# Patient Record
Sex: Female | Born: 1980 | Race: Asian | Hispanic: No | Marital: Married | Smoking: Never smoker
Health system: Southern US, Community
[De-identification: ages and names within clinical notes are randomized; demographics above are authoritative.]

## PROBLEM LIST (undated history)

## (undated) ENCOUNTER — Inpatient Hospital Stay (HOSPITAL_COMMUNITY): Payer: Self-pay

## (undated) DIAGNOSIS — O24419 Gestational diabetes mellitus in pregnancy, unspecified control: Secondary | ICD-10-CM

## (undated) DIAGNOSIS — Z8632 Personal history of gestational diabetes: Secondary | ICD-10-CM

## (undated) DIAGNOSIS — Z8751 Personal history of pre-term labor: Secondary | ICD-10-CM

## (undated) DIAGNOSIS — O139 Gestational [pregnancy-induced] hypertension without significant proteinuria, unspecified trimester: Secondary | ICD-10-CM

## (undated) DIAGNOSIS — Z8759 Personal history of other complications of pregnancy, childbirth and the puerperium: Secondary | ICD-10-CM

## (undated) DIAGNOSIS — O09299 Supervision of pregnancy with other poor reproductive or obstetric history, unspecified trimester: Secondary | ICD-10-CM

## (undated) HISTORY — DX: Personal history of gestational diabetes: Z86.32

## (undated) HISTORY — DX: Gestational diabetes mellitus in pregnancy, unspecified control: O24.419

## (undated) HISTORY — DX: Personal history of other complications of pregnancy, childbirth and the puerperium: Z87.59

## (undated) HISTORY — DX: Personal history of pre-term labor: Z87.51

## (undated) HISTORY — DX: Supervision of pregnancy with other poor reproductive or obstetric history, unspecified trimester: O09.299

## (undated) HISTORY — PX: MIDDLE EAR SURGERY: SHX713

---

## 2006-07-16 ENCOUNTER — Inpatient Hospital Stay (HOSPITAL_COMMUNITY): Admission: AD | Admit: 2006-07-16 | Discharge: 2006-07-18 | Payer: Self-pay | Admitting: Obstetrics and Gynecology

## 2006-07-16 ENCOUNTER — Ambulatory Visit: Payer: Self-pay | Admitting: Obstetrics and Gynecology

## 2007-04-02 ENCOUNTER — Ambulatory Visit: Payer: Self-pay | Admitting: Internal Medicine

## 2007-04-05 ENCOUNTER — Ambulatory Visit: Payer: Self-pay | Admitting: Family Medicine

## 2007-05-17 ENCOUNTER — Encounter: Payer: Self-pay | Admitting: Obstetrics & Gynecology

## 2007-05-17 ENCOUNTER — Observation Stay (HOSPITAL_COMMUNITY): Admission: AD | Admit: 2007-05-17 | Discharge: 2007-05-18 | Payer: Self-pay | Admitting: Obstetrics and Gynecology

## 2007-05-23 ENCOUNTER — Inpatient Hospital Stay (HOSPITAL_COMMUNITY): Admission: AD | Admit: 2007-05-23 | Discharge: 2007-05-23 | Payer: Self-pay | Admitting: Obstetrics & Gynecology

## 2007-06-02 ENCOUNTER — Emergency Department (HOSPITAL_COMMUNITY): Admission: EM | Admit: 2007-06-02 | Discharge: 2007-06-02 | Payer: Self-pay | Admitting: Family Medicine

## 2007-07-15 DIAGNOSIS — O09299 Supervision of pregnancy with other poor reproductive or obstetric history, unspecified trimester: Secondary | ICD-10-CM

## 2007-07-15 HISTORY — DX: Supervision of pregnancy with other poor reproductive or obstetric history, unspecified trimester: O09.299

## 2007-09-28 ENCOUNTER — Ambulatory Visit: Payer: Self-pay | Admitting: Internal Medicine

## 2008-01-01 ENCOUNTER — Inpatient Hospital Stay (HOSPITAL_COMMUNITY): Admission: AD | Admit: 2008-01-01 | Discharge: 2008-01-01 | Payer: Self-pay | Admitting: Gynecology

## 2008-02-26 ENCOUNTER — Ambulatory Visit: Payer: Self-pay | Admitting: Family Medicine

## 2008-02-26 ENCOUNTER — Inpatient Hospital Stay (HOSPITAL_COMMUNITY): Admission: AD | Admit: 2008-02-26 | Discharge: 2008-03-01 | Payer: Self-pay | Admitting: Obstetrics

## 2008-02-26 ENCOUNTER — Encounter: Payer: Self-pay | Admitting: Family Medicine

## 2008-02-28 ENCOUNTER — Encounter (INDEPENDENT_AMBULATORY_CARE_PROVIDER_SITE_OTHER): Payer: Self-pay | Admitting: Cardiology

## 2008-03-01 ENCOUNTER — Encounter: Payer: Self-pay | Admitting: *Deleted

## 2008-03-26 ENCOUNTER — Inpatient Hospital Stay (HOSPITAL_COMMUNITY): Admission: AD | Admit: 2008-03-26 | Discharge: 2008-03-26 | Payer: Self-pay | Admitting: Obstetrics and Gynecology

## 2008-03-26 ENCOUNTER — Ambulatory Visit: Payer: Self-pay | Admitting: Obstetrics and Gynecology

## 2008-10-05 ENCOUNTER — Emergency Department (HOSPITAL_COMMUNITY): Admission: EM | Admit: 2008-10-05 | Discharge: 2008-10-05 | Payer: Self-pay | Admitting: Emergency Medicine

## 2008-12-07 ENCOUNTER — Ambulatory Visit: Payer: Self-pay | Admitting: *Deleted

## 2008-12-07 ENCOUNTER — Ambulatory Visit: Payer: Self-pay | Admitting: Internal Medicine

## 2009-01-01 ENCOUNTER — Ambulatory Visit: Payer: Self-pay | Admitting: Internal Medicine

## 2010-11-26 NOTE — Op Note (Signed)
NAMEGELENE, RECKTENWALD                    ACCOUNT NO.:  [id number]   MEDICAL RECORD NO.:  [id number]          PATIENT TYPE:  INP   LOCATION:  9372                          FACILITY:  [facility]   PHYSICIAN:  Tanya S. Shawnie Pons, M.D.   DATE OF BIRTH:  1981-05-27   DATE OF PROCEDURE:  02/26/2008  DATE OF DISCHARGE:                               OPERATIVE REPORT   PREOPERATIVE DIAGNOSES:  Intrauterine pregnancy at 29-5/7 weeks;  hemolysis, elevated liver enzymes, and low platelet syndrome;  nonreassuring fetal heart rate; tracing limited prenatal care; and  language barrier.   POSTOPERATIVE DIAGNOSES:  Intrauterine pregnancy at 29-5/7 weeks, HELLP  syndrome, nonreassuring fetal heart rate tracing, limited prenatal care,  and language barrier.   PROCEDURES:  Primary low-transverse cesarean-section.   SURGEON:  Tinnie Gens, MD.   ASSISTANT:  Cam Hai, C.N.M.   ANESTHESIA:  General and local, Burnett Corrente, M.D.   FINDINGS:  Viable female infant, Apgars 8 and 8, weight 1340 grams, and  cord pH 7.29.   SPECIMEN:  Placenta to Pathology.   ESTIMATED BLOOD LOSS:  500 mL.   COMPLICATIONS:  None known.   REASON FOR PROCEDURE:  Briefly, the patient is a 30 year old gravida 3,  para 1-0-1-1 who has history second trimester loss, who had previously  had a normal vaginal delivery in January 2008, and in November 2008 she  had a 16-week SAB and then her third pregnancy again is unclear.  She  may be a patient of Dr. Francoise Ceo for this pregnancy.  However,  prenatal records could not be found.  The patient presented to the ER  with substernal chest pain, some marked hypertension 200/122, and  bradycardia.  She received Apresoline and magnesium sulfate in the MAU,  and laboratories were drawn.  She was then transferred to the ICU where  labs revealed the patient to be in HELLP  syndrome.  Initially fetal  heart rate tracing in the MAU showed the baby to be in the 140s with  excellent  variability and accelerations.  After the patient was  transferred to the ICU, the fetal heart rate was in the 160s with  repetitive decelerations.  The patient did undergo ultrasonography,  which revealed normal fluid, normal Dopplers, and probable IUGR 3-pound  baby.  The patient had significant retching and emesis multiple times  for which she received Zofran, but she continued to have severe  epigastric pain.  Given the nonreassuring state of the baby, the change  in the fetal heart rate, as well as severe nature of the patient's  disease, it was felt the patient would best be served by immediate  delivery.  The patient was dilated 1-2/50%.  However, the baby just  continued to look poor and it was felt immediate delivery was warranted.   PROCEDURE:  The patient was taken to the OR.  She was placed in supine  position.  She was prepped and draped in usual sterile fashion.  After  general anesthesia was administered, knife was used to make a  Pfannenstiel incision through the skin and  was carried down to through  the underlying fascia, which was nicked in the midline.  The  subcutaneous tissue was then bluntly separated.  The fascia was also  bluntly separated and taken off the underlying rectus.  The rectus was  divided in the midline, peritoneal cavity entered bluntly.  Bladder  blade was then placed inside the incision and the infant was noted to be  vertex.  So, a low transverse incision was made on the uterus.  This  incision was extended down to the amniotic cavity and the lower uterine  segment was very thin.  Clear amniotic fluid was noted.  Infant was in  the vertex and infant delivered easily and spontaneous crying was heard.  Cord was clamped x2.  Infant was given to awaiting peds.  Cord pH and  cord bloods were obtained.  Placenta was delivered without difficulty,  it looked like possible 20% abruption on the placenta bed.  The uterus  was cleaned with dry lap pads.  The  edge of the uterine incision was  grasped with ring forceps and the uterine incision closed with 0 Vicryl  suture in a locked running fashion.  Second imbricating layer was then  used to achieve hemostasis.  The uterine incision was inspected twice  and was found to be hemodynamically hemostatic.  Attention was then  turned to the rectus peritoneum, there was no bleeding noted.  Fascia  was closed with a 0 Vicryl suture in a running fashion.  Subcutaneous  tissue was irrigated and bleeders cauterized with electrocautery and  skin closed using clips.  A 30 mL of 0.25% Marcaine were then  infiltrated about the incision.  All instrument and lap counts were  correct x2.  The patient was awakened and taken to recovery room in  stable condition.      Shelbie Proctor. Shawnie Pons, M.D.  Electronically Signed     TSP/MEDQ  D:  02/27/2008  T:  02/27/2008  Job:  [id number]

## 2010-11-26 NOTE — Consult Note (Signed)
Sabrina Daniels, Sabrina Daniels                    ACCOUNT NO.:  [id number]   MEDICAL RECORD NO.:  [id number]          PATIENT TYPE:  INP   LOCATION:  9372                          [facility]ACILITY:  [facility]   PHYSICIAN:  Jake Bathe, MD      DATE OF BIRTH:  1981-06-18   DATE OF CONSULTATION:  02/27/2008  DATE OF DISCHARGE:                                 CONSULTATION   REASON FOR EVALUATION:  Sabrina Daniels has been seen at the request of Dr.  Penne Lash for the evaluation of chest pain in the setting of elevated  cardiac biomarkers.   HISTORY OF PRESENT ILLNESS:  [name] who was at 29 weeks of  pregnancy who came in with chest pain, hypertension, and was noted to be  in preeclampsia with HELLP syndrome.  She had been experiencing some  nausea and vomiting and shortness of breath prior to her C-section.  Following the C-section, cardiac biomarkers were obtained with CK 83-91-  133, MB 7.7-4.8-5.7, and troponin 0.03-0.10-0.04 in that order.  Last  night, critical care medicine was consulted and felt as though blood  pressure was adequately controlled and ECG was unremarkable.  Ordered an  ABG, which demonstrated a blood gas of 7.49/25/107, likely a low pCO2  secondary to hyperventilating from pain.   She only had 1 prenatal visit.  She currently is lying comfortably in  bed and with the assistance of her sister-in-law as a Nurse, learning disability, she  does not complain of any chest pain currently or any shortness of  breath.  She does complain, however, of some incisional pain.   PAST MEDICAL HISTORY:  None.  She has had 1 prior child without any  difficulty during that pregnancy, and she had 1 miscarriage.  No prior  history of hypertension.   MEDICATIONS:  None, except for prenatals.   ALLERGIES:  No known drug allergies.   SOCIAL HISTORY:  She denies any tobacco, alcohol, or illicit drug use.  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   FAMILY HISTORY:  No early family history of  coronary artery disease or  sudden cardiac death.   REVIEW OF SYSTEMS:  She denies any prior syncope or prior chest pain.  Positive for nausea; vomiting; earlier chest discomfort; and shortness  of breath, which has now resolved.  Unless stated above, all other 12  review of systems are negative.   PHYSICAL EXAMINATION:  VITAL SIGNS:  Blood pressure currently 162/92,  ranging from 138/90 to 160/101 with a heart rate in the 70s; satting 98%  on room air; and breathing 16 times per minute.  GENERAL:  Alert and oriented x3, in no acute distress.  Lying  comfortably in bed, family at side.  HEENT:  EYES, well-perfused conjunctivae.  EOMI.  No scleral icterus.  NECK:  Normal carotid upstrokes.  No JVD.  No carotid bruits.  No  thyromegaly.  No lymphadenopathy.  Moist mucous membranes.  CARDIOVASCULAR:  Regular rate and rhythm with hyperdynamic PMI.  No  murmurs, rubs, or gallops appreciated.  No RV heave.  LUNGS:  Clear to auscultation bilaterally.  Normal respiratory effort.  ABDOMEN:  Soft and nontender.  Normoactive bowel sounds with C-section  dressing in place.  She is, however, tender around C-section scar.  EXTREMITIES:  No clubbing, cyanosis, or edema.  Normal distal pulses.  No cords palpated.  NEUROLOGIC:  Nonfocal.  No seizure-like activity.  SKIN:  Warm, dry, and intact.   LABORATORY DATA:  EKG shows sinus tachycardia, otherwise normal.  Sodium  134; potassium 4.1; BUN 9; creatinine 0.6; glucose mildly elevated at  138, after betamethasone was given last night; total bilirubin 1.1;  alkaline phosphatase 230; AST elevated at 607; and ALT 380.  Chest x-ray  from yesterday showed no evidence for acute cardiopulmonary disease,  this one was personally viewed.   ASSESSMENT AND PLAN:  A 30 year old female at 43 weeks who had an  emergent cesarean section secondary to preeclampsia/hemolysis, elevated  liver enzymes, and low platelet syndrome who was complaining of chest  pain,  shortness of breath prior to cesarean section, who also had  elevated cardiac biomarkers as described above categorized as mildly  elevated cardiac biomarkers.  1. Chest pain - currently resolved.  Reassuring ECG and reassuring      third set of cardiac biomarkers with troponin now 0.04, CK of 133,      and MB of 5.7.  ABG also reassuring with no evidence of AA      gradient.  Low likelihood for pulmonary embolism or acute coronary      artery syndrome.  Her cardiac biomarkers mild or subtle elevation      is most likely secondary to acute illness/HELLP syndrome.  We      commonly see this in the setting of acute illnesses such as      pneumonia or sepsis for instance.  Another possibility may be due      to excessive hypertension causing mild degree of subendocardial      ischemia.  Not likely acute coronary artery syndrome.  Nonetheless,      I will check an echocardiogram to ensure that there is no evidence      of any wall motion abnormalities.  Continue telemetry.  We will      check an EKG in the morning.  Check one more set of cardiac      biomarkers in the morning.  I will also check a TSH, free T4, and      free T3.  As of note, her glucose was mildly elevated most likely      secondary to steroid use.  2. Hypertension - I will administer hydralazine 10 mg IV x1 now.  I      spoke with Dr. Penne Lash, who was comfortable with metoprolol.  This      may be a good agent for her if her blood pressure is still an issue      at discharge given its ease of use.  Continue hydrochlorothiazide.   I will follow up with results of echocardiogram.  I will also follow  along with you during this hospitalization.      Jake Bathe, MD  Electronically Signed     MCS/MEDQ  D:  02/27/2008  T:  02/28/2008  Job:  [id number]   cc:   Lesly Dukes, M.D.

## 2010-11-26 NOTE — Discharge Summary (Signed)
Sabrina Daniels                    ACCOUNT NO.:  [id number]   MEDICAL RECORD NO.:  [id number]          PATIENT TYPE:  INP   LOCATION:  9315                          FACILITY:  [facility]   PHYSICIAN:  Lesly Dukes, M.D. DATE OF BIRTH:  07-Jun-1981   DATE OF ADMISSION:  02/26/2008  DATE OF DISCHARGE:  03/01/2008                               DISCHARGE SUMMARY   REASON FOR ADMISSION:  1. Preeclampsia.  2. HELLP.  3. Chest pain.   PROCEDURES PRENATAL:  None.   PROCEDURES INTRAPARTUM:  Low-transverse cesarean section.   PROCEDURES POSTPARTUM:  None.   DISCHARGE DIAGNOSES:  1. Preeclampsia/HELLP  2. Nonreassuring fetal heart tracing leading to C-section.   DISCHARGE INFORMATION:  Activity is pelvic rest x6 weeks.   DIET:  Routine.   MEDICATIONS:  1. HCTZ 25 mg p.o. daily.  2. Ibuprofen 600 mg p.o. q.6 h. p.r.n. postpartum pain.   Status, well.  Instructions, routine.   Discharged to home.   Follow up in 6 weeks at the [facility].   NEWBORN DATA:  Delivered female, newborn is in the NICU.   BRIEF HOSPITAL COURSE:  This is a 30 year old G3, P-1-0-1-1 at 40.[redacted]  weeks gestational age who was admitted for preeclampsia with HELLP  syndrome.  The patient had emergent C-section secondary to preeclampsia  and hemolysis with elevated LFTs in the 600-800s, decreased platelets in  the 50s, chest pain, short of breath, and increased cardiac enzymes.  Cardiac enzymes were cycled and they were essentially negative for  ischemia.  Postpartum EKG and echo were normal per Cardiology consult.  The patient was placed on magnesium at admission, Magnesium was  discontinued 24 hours postop when her blood pressure stabilized.  LFTs  trended down towards normal, and the platelets increased towards normal  daily during this hospitalization.  The patient is discharged home on  postoperative day #3.  Blood pressure is stable.  She is afebrile.  For  followup, the patient needs  to have a TSH, free T3, free T4 at followup  appointment.  During this admission, she had normal TSH with increase in  free T3.  She may need further outpatient workup in Endocrinology.  She  may also need further followup for hypertension although it is believed  that her hypertension may be due to pregnancy.   DISCHARGE LABS:  Blood type positive, antibody negative, RPR  nonreactive, HIV negative, hepatitis B surface antigen negative.  Sodium  137, potassium 3.9, chloride 103, bicarb 28, BUN 6, creatinine 0.48,  glucose 80, AST 67, ALT 142, alkaline phosphatase 188, albumin 2.5,  total protein 6.0, and calcium 7.9.      Angeline Slim, MD  Electronically Signed     ______________________________  Lesly Dukes, M.D.    CT/MEDQ  D:  03/13/2008  T:  03/14/2008  Job:  [id number]

## 2011-04-21 ENCOUNTER — Other Ambulatory Visit: Payer: Self-pay | Admitting: Family Medicine

## 2011-04-21 DIAGNOSIS — Z3689 Encounter for other specified antenatal screening: Secondary | ICD-10-CM

## 2011-04-21 LAB — VARICELLA ZOSTER ANTIBODY, IGG: Varicella: IMMUNE

## 2011-04-21 LAB — CBC
Hemoglobin: 12 g/dL (ref 12.0–16.0)
Platelets: 154 10*3/uL (ref 150–399)

## 2011-04-22 LAB — POCT PREGNANCY, URINE: Operator id: 120561

## 2011-04-22 LAB — DIFFERENTIAL
Basophils Relative: 0
Eosinophils Relative: 2
Lymphs Abs: 2.5
Monocytes Absolute: 0.4

## 2011-04-22 LAB — CBC
HCT: 32.6 — ABNORMAL LOW
Hemoglobin: 10.9 — ABNORMAL LOW
MCHC: 33.6
Platelets: 180
RBC: 4.63
RDW: 15.6 — ABNORMAL HIGH
WBC: 9.7

## 2011-04-22 LAB — ABO/RH
ABO/RH(D): O POS
RH Type: POSITIVE

## 2011-04-22 LAB — RPR: RPR Ser Ql: NONREACTIVE

## 2011-04-22 LAB — ANTIBODY SCREEN: Antibody Screen: NEGATIVE

## 2011-04-23 ENCOUNTER — Inpatient Hospital Stay (HOSPITAL_COMMUNITY)
Admission: AD | Admit: 2011-04-23 | Discharge: 2011-04-23 | Disposition: A | Discharge status: Home / Self Care | Payer: Medicaid Other | Source: Ambulatory Visit | Attending: Family Medicine | Admitting: Family Medicine

## 2011-04-23 ENCOUNTER — Encounter (HOSPITAL_COMMUNITY): Payer: Self-pay

## 2011-04-23 DIAGNOSIS — O2650 Maternal hypotension syndrome, unspecified trimester: Secondary | ICD-10-CM | POA: Insufficient documentation

## 2011-04-23 DIAGNOSIS — R55 Syncope and collapse: Secondary | ICD-10-CM

## 2011-04-23 DIAGNOSIS — D649 Anemia, unspecified: Secondary | ICD-10-CM

## 2011-04-23 DIAGNOSIS — O210 Mild hyperemesis gravidarum: Secondary | ICD-10-CM | POA: Insufficient documentation

## 2011-04-23 HISTORY — DX: Gestational (pregnancy-induced) hypertension without significant proteinuria, unspecified trimester: O13.9

## 2011-04-23 LAB — COMPREHENSIVE METABOLIC PANEL
Alkaline Phosphatase: 49 U/L (ref 39–117)
CO2: 26 mEq/L (ref 19–32)
Calcium: 9.6 mg/dL (ref 8.4–10.5)
Chloride: 100 mEq/L (ref 96–112)
Glucose, Bld: 79 mg/dL (ref 70–99)
Sodium: 134 mEq/L — ABNORMAL LOW (ref 135–145)
Total Protein: 7.1 g/dL (ref 6.0–8.3)

## 2011-04-23 LAB — CBC
Hemoglobin: 11.8 g/dL — ABNORMAL LOW (ref 12.0–15.0)
MCHC: 33.1 g/dL (ref 30.0–36.0)
RBC: 5.08 MIL/uL (ref 3.87–5.11)

## 2011-04-23 LAB — URINALYSIS, ROUTINE W REFLEX MICROSCOPIC
Bilirubin Urine: NEGATIVE
Hgb urine dipstick: NEGATIVE
Ketones, ur: NEGATIVE mg/dL
Protein, ur: NEGATIVE mg/dL

## 2011-04-23 MED ORDER — ONDANSETRON 8 MG PO TBDP
8.0000 mg | ORAL_TABLET | Freq: Once | ORAL | Status: AC
  Administered 2011-04-23: 8 mg via ORAL
  Filled 2011-04-23: qty 1

## 2011-04-23 MED ORDER — ONDANSETRON 8 MG PO TBDP: 8.0000 mg | ORAL_TABLET | Freq: Once | ORAL | Status: DC

## 2011-04-23 MED ORDER — ONDANSETRON 8 MG PO TBDP: 8.0000 mg | ORAL_TABLET | Freq: Once | ORAL | Status: AC

## 2011-04-23 MED ORDER — PROMETHAZINE HCL 25 MG PO TABS: 25.0000 mg | ORAL_TABLET | Freq: Four times a day (QID) | ORAL | Status: DC | PRN

## 2011-04-23 NOTE — ED Provider Notes (Signed)
History     No chief complaint on file.  HPI Pt is 12w 5 days pregnant and passed out this morning for about 5 minutes when she got up suddenly.  Pt states that she has does this with her previous pregnancies.  She has a history of hypertension with pregnancy and preterm labor and has an appointment to go to [facility] OB clinic.  She had a small amount of rice this morning and some water and then a banana about 11 am.  Pt has been nauseated for several days but not vomiting. She just has not wanted anything to eat.  Pt denies spotting or bleeding or cramping or pain with urination, constipation or diarrhea.  She has not run a feve.  History was somewhat limited due to language barrier- [employer] translator was [employer]   Past Medical History  Diagnosis Date  . Pregnancy induced hypertension   . Preterm labor     Past Surgical History  Procedure Date  . Middle ear surgery   . Cesarean section     No family history on file.  History  Substance Use Topics  . Smoking status: Never Smoker   . Smokeless tobacco: Never Used  . Alcohol Use: No    Allergies: No Known Allergies  Prescriptions prior to admission  Medication Sig Dispense Refill  . Influenza Virus Vacc Split PF (FLUARIX IM) Inject 0.5 mLs into the muscle once.        . prenatal vitamin w/FE, FA (PRENATAL 1 + 1) 27-1 MG TABS Take 1 tablet by mouth daily.          Review of Systems  Constitutional: Negative for fever and chills.  Gastrointestinal: Positive for nausea and abdominal pain. Negative for vomiting, diarrhea and constipation.  Genitourinary: Negative for dysuria.  Neurological: Positive for dizziness. Negative for headaches.   Physical Exam   Blood pressure 107/67, pulse 79, temperature 97.8 F (36.6 C), temperature source Oral, resp. rate 16, height 5' 0.75" (1.543 m), weight 113 lb 12.8 oz (51.619 kg), SpO2 98.00%.  Physical Exam  Nursing note and vitals reviewed. Constitutional: She is oriented to person,  place, and time. She appears well-developed and well-nourished.  HENT:  Head: Normocephalic and atraumatic.  Eyes: Pupils are equal, round, and reactive to light.  Neck: Normal range of motion. Neck supple.  Cardiovascular: Normal rate.   Respiratory: Effort normal.  GI: Soft.       FHT audible  Musculoskeletal: Normal range of motion.  Neurological: She is alert and oriented to person, place, and time.  Skin: Skin is warm and dry.  Psychiatric: She has a normal mood and affect. Her behavior is normal. Judgment and thought content normal.    MAU Course  Procedures CBC, CMET, Urinalysis, VS reviewed All within normal limits except some anemia Zofran given for nausea and pt tolerated PO fluids and crackers    Assessment and Plan  Morning sickness Syncope Pt will f/u with her OB appt on Fri at the HD clinic and then at the [facility] HR clinic- appointments made Importance of hydration and protein type foods along with rising slowing Pt information in [place] given to pt Discharge instructions also given by RN with translator  Pamelia Hoit 04/23/2011, 5:43 PM

## 2011-04-23 NOTE — Progress Notes (Signed)
As translaiton continues, pt states she fell down and wants to know why.

## 2011-04-23 NOTE — Progress Notes (Signed)
Per [employer] interpreter, pt states fell down, body felt dizzy, all occurred at 10am, denies vaginal discharge or bleeding, states when she gets up, body feels sweaty. Has only felt like this once before. Felt nausea at 1000, denies vomiting or diarrhea. Ate rice at 0800, banana at 1100, drank water last then.

## 2011-04-23 NOTE — Progress Notes (Signed)
[employer] translator for triage. Pt states she had blurred vision this morning and fell asleep. Her husband states for 5 minutes she did not know anything. Pt denies any pain. Nausea and vomiting x 2 today. Pt states she feels OK now.

## 2011-04-24 NOTE — ED Provider Notes (Signed)
Chart reviewed and agree with management and plan.  

## 2011-04-25 LAB — GLUCOSE TOLERANCE, 3 HOURS
Glucose, GTT - 1 Hour: 90 mg/dL (ref ?–200)
Glucose, GTT - 3 Hour: 63 mg/dL (ref ?–140)
Glucose, GTT - Fasting: 67 mg/dL — AB (ref 80–110)

## 2011-04-29 DIAGNOSIS — O09299 Supervision of pregnancy with other poor reproductive or obstetric history, unspecified trimester: Secondary | ICD-10-CM | POA: Insufficient documentation

## 2011-04-29 DIAGNOSIS — D573 Sickle-cell trait: Secondary | ICD-10-CM

## 2011-04-29 NOTE — Progress Notes (Signed)
Pt received flu vaccine on 04/21/11 at [facility]

## 2011-05-01 ENCOUNTER — Ambulatory Visit (INDEPENDENT_AMBULATORY_CARE_PROVIDER_SITE_OTHER): Payer: Medicaid Other | Admitting: Obstetrics & Gynecology

## 2011-05-01 ENCOUNTER — Encounter: Payer: Self-pay | Admitting: Obstetrics & Gynecology

## 2011-05-01 VITALS — BP 116/79 | Temp 97.1°F | Wt 115.1 lb

## 2011-05-01 DIAGNOSIS — O09299 Supervision of pregnancy with other poor reproductive or obstetric history, unspecified trimester: Secondary | ICD-10-CM

## 2011-05-01 LAB — POCT URINALYSIS DIP (DEVICE)
Glucose, UA: NEGATIVE mg/dL
Hgb urine dipstick: NEGATIVE
Specific Gravity, Urine: 1.015 (ref 1.005–1.030)
Urobilinogen, UA: 0.2 mg/dL (ref 0.0–1.0)
pH: 7.5 (ref 5.0–8.0)

## 2011-05-01 NOTE — Progress Notes (Signed)
Pulse-78. Interpreter # [phone].  No vaginal discharge.

## 2011-05-01 NOTE — Progress Notes (Signed)
Needs baseline labs and 24 hour urine for hx of HELLP at 29 weeks requiring emergency c/s due to NRFHT.  Pt had elevated T3--TSH today.  Pt told of 6% recurrence of HELLP.  Pt had an episode of heart racing and pt syncopal episode for 5 mins on Oct 10th.  Will send to cards to evaluate.  Offer genetic screening next visit.   No urine on chart to record in chart.  RN aware.

## 2011-05-01 NOTE — Assessment & Plan Note (Signed)
Needs baseline labs / 24 hour urine.

## 2011-05-01 NOTE — Progress Notes (Signed)
Patient has a cardiology appt. Tues. October 23 at 345 pm with Dr. Daleen Squibb at [facility]. Patient and friend given contact info. Interpreter [phone].

## 2011-05-02 LAB — TSH: TSH: 0.9 u[IU]/mL (ref 0.350–4.500)

## 2011-05-02 LAB — COMPREHENSIVE METABOLIC PANEL
ALT: 18 U/L (ref 0–35)
AST: 22 U/L (ref 0–37)
Alkaline Phosphatase: 44 U/L (ref 39–117)
Creat: 0.48 mg/dL — ABNORMAL LOW (ref 0.50–1.10)
Total Bilirubin: 0.3 mg/dL (ref 0.3–1.2)

## 2011-05-05 NOTE — Progress Notes (Signed)
Addended by: Faythe Casa on: 05/05/2011 08:31 AM   Modules accepted: Orders

## 2011-05-06 ENCOUNTER — Encounter: Payer: Self-pay | Admitting: Cardiology

## 2011-05-06 ENCOUNTER — Ambulatory Visit (INDEPENDENT_AMBULATORY_CARE_PROVIDER_SITE_OTHER): Payer: Medicaid Other | Admitting: Cardiology

## 2011-05-06 VITALS — BP 116/76 | HR 79 | Ht 61.0 in | Wt 116.0 lb

## 2011-05-06 DIAGNOSIS — R002 Palpitations: Secondary | ICD-10-CM

## 2011-05-06 DIAGNOSIS — R0602 Shortness of breath: Secondary | ICD-10-CM | POA: Insufficient documentation

## 2011-05-06 DIAGNOSIS — R42 Dizziness and giddiness: Secondary | ICD-10-CM | POA: Insufficient documentation

## 2011-05-06 DIAGNOSIS — O09299 Supervision of pregnancy with other poor reproductive or obstetric history, unspecified trimester: Secondary | ICD-10-CM

## 2011-05-06 LAB — CREATININE CLEARANCE, URINE, 24 HOUR: Creatinine, Urine: 81.2 mg/dL

## 2011-05-06 NOTE — Patient Instructions (Signed)
Your physician recommends that you have lab work today  Bmp, mg, tsh.  We will call you with your lab results.    Remember to drink plenty of fluids especially water during your pregnancy.   You may use salt in your cooking.  It is not restricted.  If you become short of breath while lying in bed, elevate your head on 2 pillows. This will help to move the baby off of chest and help you to breathe better.  When you are getting out of bed turn over on your left side, then slowly, get up out of bed.  This will help reduce the dizziness or lightheadness you may experience when getting out of bed.  Your physician recommends that you schedule a follow-up appointment as needed with Dr. Daleen Squibb.

## 2011-05-06 NOTE — Progress Notes (Signed)
HPI Sabrina Daniels is a 30 year old [place] woman who comes in today with hard heart beats, shortness of breath when she lies down, and dizziness upon standing.  This is her fourth pregnancy. In 2009, she had chest discomfort. She ruled out for myocardial infarction with enzymes. 2-D echocardiogram was completely normal and reviewed today with her and her interpreter. Reassurance was given. I reviewed this with the patient and interpreter.  Over the past couple weeks, she has had hard heart beats very usually short-lived. These occur both at rest and with sitting up and moving. She's had no presyncope or syncope. She denies any chest pain. She has had a couple episodes of lightheadedness when changing position.   She denies any orthopnea per se PND or edema. She has no dyspnea on exertion.  She's currently 3 months pregnant.  Her EKG is completely normal today. I reviewed this with the patient and interpreter.  Past Medical History  Diagnosis Date  . Pregnancy induced hypertension   . Preterm labor     Past Surgical History  Procedure Date  . Middle ear surgery   . Cesarean section     No family history on file.  History   Social History  . Marital Status: Married    Spouse Name: N/A    Number of Children: N/A  . Years of Education: N/A   Occupational History  . Not on file.   Social History Main Topics  . Smoking status: Never Smoker   . Smokeless tobacco: Never Used  . Alcohol Use: No  . Drug Use: No  . Sexually Active: Yes   Other Topics Concern  . Not on file   Social History Narrative  . No narrative on file    No Known Allergies  Current Outpatient Prescriptions  Medication Sig Dispense Refill  . prenatal vitamin w/FE, FA (PRENATAL 1 + 1) 27-1 MG TABS Take 1 tablet by mouth daily.          ROS Negative other than HPI.   PE General Appearance: well developed, well nourished in no acute distress HEENT: symmetrical face, PERRLA, good dentition  Neck: no  JVD, thyromegaly, or adenopathy, trachea midline Chest: symmetric without deformity Cardiac: PMI non-displaced, RRR, normal S1, S2, no gallop or murmur Lung: clear to ausculation and percussion Vascular: all pulses full without bruits  Abdominal: nondistended, nontender, good bowel sounds, no HSM, no bruits Extremities: no cyanosis, clubbing or edema, no sign of DVT, no varicosities  Skin: normal color, no rashes Neuro: alert and oriented x 3, non-focal Pysch: normal affect Filed Vitals:   05/06/11 1544  BP: 116/76  Pulse: 79  Height: 5\' 1"  (1.549 m)  Weight: 116 lb (52.617 kg)    EKG  Labs and Studies Reviewed.   Lab Results  Component Value Date   WBC 9.2 04/23/2011   HGB 11.8* 04/23/2011   HCT 35.6* 04/23/2011   MCV 70.1* 04/23/2011   PLT 162 04/23/2011      Chemistry      Component Value Date/Time   NA 137 05/01/2011 1052   K 4.5 05/01/2011 1052   CL 104 05/01/2011 1052   CO2 21 05/01/2011 1052   BUN 8 05/01/2011 1052   CREATININE 0.48* 05/05/2011 0833   CREATININE 0.48* 05/01/2011 1052   CREATININE <0.47* 04/23/2011 1651      Component Value Date/Time   CALCIUM 9.1 05/01/2011 1052   ALKPHOS 44 05/01/2011 1052   AST 22 05/01/2011 1052   ALT 18 05/01/2011 1052  BILITOT 0.3 05/01/2011 1052       No results found for this basename: CHOL   No results found for this basename: HDL   No results found for this basename: LDLCALC   No results found for this basename: TRIG   No results found for this basename: CHOLHDL   No results found for this basename: HGBA1C   Lab Results  Component Value Date   ALT 18 05/01/2011   AST 22 05/01/2011   ALKPHOS 44 05/01/2011   BILITOT 0.3 05/01/2011   Lab Results  Component Value Date   TSH 0.900 05/01/2011

## 2011-05-07 LAB — BASIC METABOLIC PANEL
BUN: 8 mg/dL (ref 6–23)
CO2: 25 mEq/L (ref 19–32)
Chloride: 105 mEq/L (ref 96–112)
Creatinine, Ser: 0.5 mg/dL (ref 0.4–1.2)

## 2011-05-29 ENCOUNTER — Ambulatory Visit (INDEPENDENT_AMBULATORY_CARE_PROVIDER_SITE_OTHER): Payer: Medicaid Other | Admitting: Obstetrics & Gynecology

## 2011-05-29 DIAGNOSIS — O09299 Supervision of pregnancy with other poor reproductive or obstetric history, unspecified trimester: Secondary | ICD-10-CM

## 2011-05-29 DIAGNOSIS — O0990 Supervision of high risk pregnancy, unspecified, unspecified trimester: Secondary | ICD-10-CM | POA: Insufficient documentation

## 2011-05-29 DIAGNOSIS — D573 Sickle-cell trait: Secondary | ICD-10-CM

## 2011-05-29 LAB — POCT URINALYSIS DIP (DEVICE)
Glucose, UA: NEGATIVE mg/dL
Hgb urine dipstick: NEGATIVE
Protein, ur: NEGATIVE mg/dL
Specific Gravity, Urine: 1.025 (ref 1.005–1.030)
Urobilinogen, UA: 0.2 mg/dL (ref 0.0–1.0)

## 2011-05-29 NOTE — Progress Notes (Signed)
Pulse 84. No vaginal discharge. [facility]. Used interpreter [employer] Ksor.

## 2011-05-29 NOTE — Patient Instructions (Signed)
Breastfeeding BENEFITS OF BREASTFEEDING For the baby  The first milk (colostrum) helps the baby's digestive system function better.   There are antibodies from the mother in the milk that help the baby fight off infections.   The baby has a lower incidence of asthma, allergies, and SIDS (sudden infant death syndrome).   The nutrients in breast milk are better than formulas for the baby and helps the baby's brain grow better.   Babies who breastfeed have less gas, colic, and constipation.  For the mother  Breastfeeding helps develop a very special bond between mother and baby.   It is more convenient, always available at the correct temperature and cheaper than formula feeding.   It burns calories in the mother and helps with losing weight that was gained during pregnancy.   It makes the uterus contract back down to normal size faster and slows bleeding following delivery.   Breastfeeding mothers have a lower risk of developing breast cancer.  NURSE FREQUENTLY  A healthy, full-term baby may breastfeed as often as every hour or space his or her feedings to every 3 hours.   How often to nurse will vary from baby to baby. Watch your baby for signs of hunger, not the clock.   Nurse as often as the baby requests, or when you feel the need to reduce the fullness of your breasts.   Awaken the baby if it has been 3 to 4 hours since the last feeding.   Frequent feeding will help the mother make more milk and will prevent problems like sore nipples and engorgement of the breasts.  BABY'S POSITION AT THE BREAST  Whether lying down or sitting, be sure that the baby's tummy is facing your tummy.   Support the breast with 4 fingers underneath the breast and the thumb above. Make sure your fingers are well away from the nipple and baby's mouth.   Stroke the baby's lips and cheek closest to the breast gently with your finger or nipple.   When the baby's mouth is open wide enough, place  all of your nipple and as much of the dark area around the nipple as possible into your baby's mouth.   Pull the baby in close so the tip of the nose and the baby's cheeks touch the breast during the feeding.  FEEDINGS  The length of each feeding varies from baby to baby and from feeding to feeding.   The baby must suck about 2 to 3 minutes for your milk to get to him or her. This is called a "let down." For this reason, allow the baby to feed on each breast as long as he or she wants. Your baby will end the feeding when he or she has received the right balance of nutrients.   To break the suction, put your finger into the corner of the baby's mouth and slide it between his or her gums before removing your breast from his or her mouth. This will help prevent sore nipples.  REDUCING BREAST ENGORGEMENT  In the first week after your baby is born, you may experience signs of breast engorgement. When breasts are engorged, they feel heavy, warm, full, and may be tender to the touch. You can reduce engorgement if you:   Nurse frequently, every 2 to 3 hours. Mothers who breastfeed early and often have fewer problems with engorgement.   Place light ice packs on your breasts between feedings. This reduces swelling. Wrap the ice packs in a   lightweight towel to protect your skin.   Apply moist hot packs to your breast for 5 to 10 minutes before each feeding. This increases circulation and helps the milk flow.   Gently massage your breast before and during the feeding.   Make sure that the baby empties at least one breast at every feeding before switching sides.   Use a breast pump to empty the breasts if your baby is sleepy or not nursing well. You may also want to pump if you are returning to work or or you feel you are getting engorged.   Avoid bottle feeds, pacifiers or supplemental feedings of water or juice in place of breastfeeding.   Be sure the baby is latched on and positioned properly while  breastfeeding.   Prevent fatigue, stress, and anemia.   Wear a supportive bra, avoiding underwire styles.   Eat a balanced diet with enough fluids.  If you follow these suggestions, your engorgement should improve in 24 to 48 hours. If you are still experiencing difficulty, call your lactation consultant or caregiver. IS MY BABY GETTING ENOUGH MILK? Sometimes, mothers worry about whether their babies are getting enough milk. You can be assured that your baby is getting enough milk if:  The baby is actively sucking and you hear swallowing.   The baby nurses at least 8 to 12 times in a 24 hour time period. Nurse your baby until he or she unlatches or falls asleep at the first breast (at least 10 to 20 minutes), then offer the second side.   The baby is wetting 5 to 6 disposable diapers (6 to 8 cloth diapers) in a 24 hour period by 5 to 6 days of age.   The baby is having at least 2 to 3 stools every 24 hours for the first few months. Breast milk is all the food your baby needs. It is not necessary for your baby to have water or formula. In fact, to help your breasts make more milk, it is best not to give your baby supplemental feedings during the early weeks.   The stool should be soft and yellow.   The baby should gain 4 to 7 ounces per week after he is 4 days old.  TAKE CARE OF YOURSELF Take care of your breasts by:  Bathing or showering daily.   Avoiding the use of soaps on your nipples.   Start feedings on your left breast at one feeding and on your right breast at the next feeding.   You will notice an increase in your milk supply 2 to 5 days after delivery. You may feel some discomfort from engorgement, which makes your breasts very firm and often tender. Engorgement "peaks" out within 24 to 48 hours. In the meantime, apply warm moist towels to your breasts for 5 to 10 minutes before feeding. Gentle massage and expression of some milk before feeding will soften your breasts, making  it easier for your baby to latch on. Wear a well fitting nursing bra and air dry your nipples for 10 to 15 minutes after each feeding.   Only use cotton bra pads.   Only use pure lanolin on your nipples after nursing. You do not need to wash it off before nursing.  Take care of yourself by:   Eating well-balanced meals and nutritious snacks.   Drinking milk, fruit juice, and water to satisfy your thirst (about 8 glasses a day).   Getting plenty of rest.   Increasing calcium in   your diet (1200 mg a day).   Avoiding foods that you notice affect the baby in a bad way.  SEEK MEDICAL CARE IF:   You have any questions or difficulty with breastfeeding.   You need help.   You have a hard, red, sore area on your breast, accompanied by a fever of 100.5 F (38.1 C) or more.   Your baby is too sleepy to eat well or is having trouble sleeping.   Your baby is wetting less than 6 diapers per day, by 5 days of age.   Your baby's skin or white part of his or her eyes is more yellow than it was in the hospital.   You feel depressed.  Document Released: 06/30/2005 Document Revised: 03/12/2011 Document Reviewed: 02/12/2009 [employer] Patient Information 2012 [employer]. 

## 2011-05-29 NOTE — Progress Notes (Signed)
Quad screen today.  Anatomy US ordered.  BP nml.  No complaints.

## 2011-06-09 ENCOUNTER — Encounter: Payer: Self-pay | Admitting: Family Medicine

## 2011-06-09 ENCOUNTER — Ambulatory Visit (HOSPITAL_COMMUNITY)
Admission: RE | Admit: 2011-06-09 | Discharge: 2011-06-09 | Disposition: A | Discharge status: Home / Self Care | Payer: Medicaid Other | Source: Ambulatory Visit | Attending: Family Medicine | Admitting: Family Medicine

## 2011-06-09 DIAGNOSIS — O34219 Maternal care for unspecified type scar from previous cesarean delivery: Secondary | ICD-10-CM | POA: Insufficient documentation

## 2011-06-09 DIAGNOSIS — Z3689 Encounter for other specified antenatal screening: Secondary | ICD-10-CM

## 2011-06-09 DIAGNOSIS — Z363 Encounter for antenatal screening for malformations: Secondary | ICD-10-CM | POA: Insufficient documentation

## 2011-06-09 DIAGNOSIS — O358XX0 Maternal care for other (suspected) fetal abnormality and damage, not applicable or unspecified: Secondary | ICD-10-CM | POA: Insufficient documentation

## 2011-06-09 DIAGNOSIS — O09219 Supervision of pregnancy with history of pre-term labor, unspecified trimester: Secondary | ICD-10-CM | POA: Insufficient documentation

## 2011-06-09 DIAGNOSIS — Z1389 Encounter for screening for other disorder: Secondary | ICD-10-CM | POA: Insufficient documentation

## 2011-06-09 DIAGNOSIS — Z8751 Personal history of pre-term labor: Secondary | ICD-10-CM | POA: Insufficient documentation

## 2011-06-10 ENCOUNTER — Encounter: Payer: Self-pay | Admitting: *Deleted

## 2011-06-26 ENCOUNTER — Ambulatory Visit (INDEPENDENT_AMBULATORY_CARE_PROVIDER_SITE_OTHER): Payer: Medicaid Other | Admitting: Obstetrics and Gynecology

## 2011-06-26 ENCOUNTER — Other Ambulatory Visit: Payer: Self-pay | Admitting: Obstetrics & Gynecology

## 2011-06-26 DIAGNOSIS — Z98891 History of uterine scar from previous surgery: Secondary | ICD-10-CM | POA: Insufficient documentation

## 2011-06-26 DIAGNOSIS — D573 Sickle-cell trait: Secondary | ICD-10-CM

## 2011-06-26 DIAGNOSIS — O0990 Supervision of high risk pregnancy, unspecified, unspecified trimester: Secondary | ICD-10-CM

## 2011-06-26 DIAGNOSIS — O09299 Supervision of pregnancy with other poor reproductive or obstetric history, unspecified trimester: Secondary | ICD-10-CM

## 2011-06-26 LAB — POCT URINALYSIS DIP (DEVICE)
Protein, ur: NEGATIVE mg/dL
Urobilinogen, UA: 0.2 mg/dL (ref 0.0–1.0)

## 2011-06-26 NOTE — Progress Notes (Signed)
Patient without complaints. Doing well. Patient interested in [facility], information provided. Patient told that this topic will be revisited in a few weeks.

## 2011-07-15 NOTE — L&D Delivery Note (Signed)
Delivery Note At 3:24 AM a viable female was delivered via Vaginal, Spontaneous Delivery (Presentation: LOA ). No Nuchal  APGAR: 9, 9; weight .   Placenta status: Spontaneous by Veatrice Kells, 3V Cord: Intact . Vigorous infant to mother's abd.  Anesthesia: None  Episiotomy: None Lacerations: None Suture Repair: none Est. Blood Loss (mL): 250  Mom to postpartum.  Baby to nursery-stable.  Jasen Hartstein E. 10/31/2011, 3:38 AM

## 2011-07-17 ENCOUNTER — Ambulatory Visit (INDEPENDENT_AMBULATORY_CARE_PROVIDER_SITE_OTHER): Payer: Medicaid Other | Admitting: Obstetrics & Gynecology

## 2011-07-17 VITALS — Temp 97.1°F | Wt 123.9 lb

## 2011-07-17 DIAGNOSIS — O09299 Supervision of pregnancy with other poor reproductive or obstetric history, unspecified trimester: Secondary | ICD-10-CM

## 2011-07-17 LAB — POCT URINALYSIS DIP (DEVICE)
Glucose, UA: NEGATIVE mg/dL
Hgb urine dipstick: NEGATIVE
Nitrite: NEGATIVE
Urobilinogen, UA: 0.2 mg/dL (ref 0.0–1.0)

## 2011-07-17 NOTE — Progress Notes (Signed)
No problems except brief nosebleed last week. Schedule 28 week [place], 28 week labs next visit

## 2011-07-17 NOTE — Progress Notes (Signed)
U/S scheduled 08/07/11 at 945 am.

## 2011-08-07 ENCOUNTER — Ambulatory Visit (INDEPENDENT_AMBULATORY_CARE_PROVIDER_SITE_OTHER): Payer: Medicaid Other | Admitting: Obstetrics and Gynecology

## 2011-08-07 ENCOUNTER — Ambulatory Visit (HOSPITAL_COMMUNITY)
Admission: RE | Admit: 2011-08-07 | Discharge: 2011-08-07 | Disposition: A | Discharge status: Home / Self Care | Payer: Medicaid Other | Source: Ambulatory Visit | Attending: Obstetrics & Gynecology | Admitting: Obstetrics & Gynecology

## 2011-08-07 DIAGNOSIS — Z8751 Personal history of pre-term labor: Secondary | ICD-10-CM | POA: Insufficient documentation

## 2011-08-07 DIAGNOSIS — Z9889 Other specified postprocedural states: Secondary | ICD-10-CM

## 2011-08-07 DIAGNOSIS — O09219 Supervision of pregnancy with history of pre-term labor, unspecified trimester: Secondary | ICD-10-CM | POA: Insufficient documentation

## 2011-08-07 DIAGNOSIS — O09299 Supervision of pregnancy with other poor reproductive or obstetric history, unspecified trimester: Secondary | ICD-10-CM

## 2011-08-07 DIAGNOSIS — O0990 Supervision of high risk pregnancy, unspecified, unspecified trimester: Secondary | ICD-10-CM

## 2011-08-07 DIAGNOSIS — Z98891 History of uterine scar from previous surgery: Secondary | ICD-10-CM

## 2011-08-07 DIAGNOSIS — O34219 Maternal care for unspecified type scar from previous cesarean delivery: Secondary | ICD-10-CM | POA: Insufficient documentation

## 2011-08-07 LAB — CBC
HCT: 33.9 % — ABNORMAL LOW (ref 36.0–46.0)
MCH: 22.9 pg — ABNORMAL LOW (ref 26.0–34.0)
MCV: 69.9 fL — ABNORMAL LOW (ref 78.0–100.0)
Platelets: 198 10*3/uL (ref 150–400)
RBC: 4.85 MIL/uL (ref 3.87–5.11)

## 2011-08-07 LAB — POCT URINALYSIS DIP (DEVICE)
Bilirubin Urine: NEGATIVE
Nitrite: NEGATIVE
Specific Gravity, Urine: 1.02 (ref 1.005–1.030)
pH: 6.5 (ref 5.0–8.0)

## 2011-08-07 NOTE — Progress Notes (Signed)
28 week labs and 1 hr gtt today, blood draw due at 0940

## 2011-08-07 NOTE — Progress Notes (Signed)
Patient doing well without complaints. F/U growth ultrasound today. 1hr gct today. Patient to sign TOLAC at next visit. Patient undecided on birth control

## 2011-08-12 ENCOUNTER — Encounter: Payer: Self-pay | Admitting: Obstetrics and Gynecology

## 2011-08-21 ENCOUNTER — Ambulatory Visit (INDEPENDENT_AMBULATORY_CARE_PROVIDER_SITE_OTHER): Payer: Medicaid Other | Admitting: Family Medicine

## 2011-08-21 ENCOUNTER — Encounter: Payer: Self-pay | Admitting: Family Medicine

## 2011-08-21 DIAGNOSIS — O0990 Supervision of high risk pregnancy, unspecified, unspecified trimester: Secondary | ICD-10-CM

## 2011-08-21 DIAGNOSIS — Z98891 History of uterine scar from previous surgery: Secondary | ICD-10-CM

## 2011-08-21 DIAGNOSIS — O09299 Supervision of pregnancy with other poor reproductive or obstetric history, unspecified trimester: Secondary | ICD-10-CM

## 2011-08-21 DIAGNOSIS — Z9889 Other specified postprocedural states: Secondary | ICD-10-CM

## 2011-08-21 LAB — POCT URINALYSIS DIP (DEVICE)
Hgb urine dipstick: NEGATIVE
Ketones, ur: NEGATIVE mg/dL
Protein, ur: 30 mg/dL — AB
Specific Gravity, Urine: 1.02 (ref 1.005–1.030)
pH: 7 (ref 5.0–8.0)

## 2011-08-21 NOTE — Progress Notes (Signed)
Used interpreter from language resources. 

## 2011-08-21 NOTE — Progress Notes (Signed)
Patient without complaints.  Denies vaginal bleeding, abnormal vaginal discharge, contractions, loss of fluid.  Reports good fetal activity.  Will send UCx for proteinuria and small leukocytes.  Follow up in 2 weeks.

## 2011-08-22 LAB — CULTURE, OB URINE

## 2011-09-04 ENCOUNTER — Ambulatory Visit (INDEPENDENT_AMBULATORY_CARE_PROVIDER_SITE_OTHER): Payer: Medicaid Other | Admitting: Physician Assistant

## 2011-09-04 DIAGNOSIS — O26899 Other specified pregnancy related conditions, unspecified trimester: Secondary | ICD-10-CM

## 2011-09-04 DIAGNOSIS — O9989 Other specified diseases and conditions complicating pregnancy, childbirth and the puerperium: Secondary | ICD-10-CM

## 2011-09-04 DIAGNOSIS — R300 Dysuria: Secondary | ICD-10-CM

## 2011-09-04 DIAGNOSIS — O09299 Supervision of pregnancy with other poor reproductive or obstetric history, unspecified trimester: Secondary | ICD-10-CM

## 2011-09-04 DIAGNOSIS — Z23 Encounter for immunization: Secondary | ICD-10-CM

## 2011-09-04 LAB — POCT URINALYSIS DIP (DEVICE)
Bilirubin Urine: NEGATIVE
Glucose, UA: NEGATIVE mg/dL
Ketones, ur: NEGATIVE mg/dL
Specific Gravity, Urine: 1.02 (ref 1.005–1.030)

## 2011-09-04 MED ORDER — CEPHALEXIN 500 MG PO CAPS: 500.0000 mg | ORAL_CAPSULE | Freq: Four times a day (QID) | ORAL | Status: AC

## 2011-09-04 MED ORDER — TETANUS-DIPHTH-ACELL PERTUSSIS 5-2.5-18.5 LF-MCG/0.5 IM SUSP
0.5000 mL | Freq: Once | INTRAMUSCULAR | Status: AC
  Administered 2011-09-04: 0.5 mL via INTRAMUSCULAR

## 2011-09-04 NOTE — Patient Instructions (Signed)
Urinary Tract Infection in Pregnancy A urinary tract infection (UTI) is a bacterial infection of the urinary tract. Infection of the urinary tract can include the ureters, kidneys (pyelonephritis), bladder (cystitis), and urethra (urethritis). All pregnant women should be screened for bacteria in the urinary tract. Identifying and treating a UTI will decrease the risk of preterm labor and developing more serious infections in both the mother and baby. CAUSES Bacteria germs cause almost all UTIs. There are many factors that can increase your chances of getting a UTI during pregnancy. These include:  Having a short urethra.   Poor toilet and hygiene habits.   Sexual intercourse.   Blockage of urine along the urinary tract.   Problems with the pelvic muscles or nerves.   Diabetes.   Obesity.   Bladder problems after having several children.   Previous history of UTI.  SYMPTOMS   Pain, burning, or a stinging feeling when urinating.   Suddenly feeling the need to urinate right away (urgency).   Loss of bladder control (urinary incontinence).   Frequent urination, more than is common with pregnancy.   Lower abdominal or back discomfort.   Bad smelling urine.   Cloudy urine.   Blood in the urine (hematuria).   Fever.  When the kidneys are infected, the symptoms may be:  Back pain.   Flank pain on the right side more so than the left.   Fever.   Chills.   Nausea.   Vomiting.  DIAGNOSIS   Urine tests.   Additional tests and procedures may include:   Ultrasound of the kidneys, ureters, bladder, and urethra.   Looking in the bladder with a lighted tube (cystoscopy).   Certain X-ray studies only when absolutely necessary.  Finding out the results of your test Ask when your test results will be ready. Make sure you get your test results. TREATMENT  Antibiotic medicine by mouth.   Antibiotics given through the vein (intravenously), if needed.  HOME CARE  INSTRUCTIONS   Take your antibiotics as directed. Finish them even if you start to feel better. Only take medicine as directed by your caregiver.   Drink enough fluids to keep your urine clear or pale yellow.   Do not have sexual intercourse until the infection is gone and your caregiver says it is okay.   Make sure you are tested for UTIs throughout your pregnancy if you get one. These infections often come back.  Preventing a UTI in the future:  Practice good toilet habits. Always wipe from front to back. Use the tissue only once.   Do not hold your urine. Empty your bladder as soon as possible when the urge comes.   Do not douche or use deodorant sprays.   Wash with soap and warm water around the genital area and the anus.   Empty your bladder before and after sexual intercourse.   Wear underwear with a cotton crotch.   Avoid caffeine and carbonated drinks. They can irritate the bladder.   Drink cranberry juice or take cranberry pills. This may decrease the risk of getting a UTI.   Do not drink alcohol.   Keep all your appointments and tests as scheduled.  SEEK MEDICAL CARE IF:   Your symptoms get worse.   You are still having fevers 2 or more days after treatment begins.   You develop a rash.   You feel that you are having problems with medicines prescribed.   You develop abnormal vaginal discharge.  SEEK IMMEDIATE MEDICAL   CARE IF:   You develop back or flank pain.   You develop chills.   You have blood in your urine.   You develop nausea and vomiting.   You develop contractions of your uterus.   You have a gush of fluid from the vagina.  MAKE SURE YOU:   Understand these instructions.   Will watch your condition.   Will get help right away if you are not doing well or get worse.  Document Released: 10/25/2010 Document Revised: 03/12/2011 Document Reviewed: 10/25/2010 [facility] Patient Information 2012 [place], [place].Pregnancy - Third Trimester The  third trimester of pregnancy (the last 3 months) is a period of the most rapid growth for you and your baby. The baby approaches a length of 20 inches and a weight of 6 to 10 pounds. The baby is adding on fat and getting ready for life outside your body. While inside, babies have periods of sleeping and waking, suck their thumbs, and hiccups. You can often feel small contractions of the uterus. This is false labor. It is also called Braxton-Hicks contractions. This is like a practice for labor. The usual problems in this stage of pregnancy include more difficulty breathing, swelling of the hands and feet from water retention, and having to urinate more often because of the uterus and baby pressing on your bladder.  PRENATAL EXAMS  Blood work may continue to be done during prenatal exams. These tests are done to check on your health and the probable health of your baby. Blood work is used to follow your blood levels (hemoglobin). Anemia (low hemoglobin) is common during pregnancy. Iron and vitamins are given to help prevent this. You may also continue to be checked for diabetes. Some of the past blood tests may be done again.   The size of the uterus is measured during each visit. This makes sure your baby is growing properly according to your pregnancy dates.   Your blood pressure is checked every prenatal visit. This is to make sure you are not getting toxemia.   Your urine is checked every prenatal visit for infection, diabetes and protein.   Your weight is checked at each visit. This is done to make sure gains are happening at the suggested rate and that you and your baby are growing normally.   Sometimes, an ultrasound is performed to confirm the position and the proper growth and development of the baby. This is a test done that bounces harmless sound waves off the baby so your caregiver can more accurately determine due dates.   Discuss the type of pain medication and anesthesia you will have  during your labor and delivery.   Discuss the possibility and anesthesia if a Cesarean Section might be necessary.   Inform your caregiver if there is any mental or physical violence at home.  Sometimes, a specialized non-stress test, contraction stress test and biophysical profile are done to make sure the baby is not having a problem. Checking the amniotic fluid surrounding the baby is called an amniocentesis. The amniotic fluid is removed by sticking a needle into the belly (abdomen). This is sometimes done near the end of pregnancy if an early delivery is required. In this case, it is done to help make sure the baby's lungs are mature enough for the baby to live outside of the womb. If the lungs are not mature and it is unsafe to deliver the baby, an injection of cortisone medication is given to the mother 1 to 2 days  before the delivery. This helps the baby's lungs mature and makes it safer to deliver the baby. CHANGES OCCURING IN THE THIRD TRIMESTER OF PREGNANCY Your body goes through many changes during pregnancy. They vary from person to person. Talk to your caregiver about changes you notice and are concerned about.  During the last trimester, you have probably had an increase in your appetite. It is normal to have cravings for certain foods. This varies from person to person and pregnancy to pregnancy.   You may begin to get stretch marks on your hips, abdomen, and breasts. These are normal changes in the body during pregnancy. There are no exercises or medications to take which prevent this change.   Constipation may be treated with a stool softener or adding bulk to your diet. Drinking lots of fluids, fiber in vegetables, fruits, and whole grains are helpful.   Exercising is also helpful. If you have been very active up until your pregnancy, most of these activities can be continued during your pregnancy. If you have been less active, it is helpful to start an exercise program such as  walking. Consult your caregiver before starting exercise programs.   Avoid all smoking, alcohol, un-prescribed drugs, herbs and "street drugs" during your pregnancy. These chemicals affect the formation and growth of the baby. Avoid chemicals throughout the pregnancy to ensure the delivery of a healthy infant.   Backache, varicose veins and hemorrhoids may develop or get worse.   You will tire more easily in the third trimester, which is normal.   The baby's movements may be stronger and more often.   You may become short of breath easily.   Your belly button may stick out.   A yellow discharge may leak from your breasts called colostrum.   You may have a bloody mucus discharge. This usually occurs a few days to a week before labor begins.  HOME CARE INSTRUCTIONS   Keep your caregiver's appointments. Follow your caregiver's instructions regarding medication use, exercise, and diet.   During pregnancy, you are providing food for you and your baby. Continue to eat regular, well-balanced meals. Choose foods such as meat, fish, milk and other low fat dairy products, vegetables, fruits, and whole-grain breads and cereals. Your caregiver will tell you of the ideal weight gain.   A physical sexual relationship may be continued throughout pregnancy if there are no other problems such as early (premature) leaking of amniotic fluid from the membranes, vaginal bleeding, or belly (abdominal) pain.   Exercise regularly if there are no restrictions. Check with your caregiver if you are unsure of the safety of your exercises. Greater weight gain will occur in the last 2 trimesters of pregnancy. Exercising helps:   Control your weight.   Get you in shape for labor and delivery.   You lose weight after you deliver.   Rest a lot with legs elevated, or as needed for leg cramps or low back pain.   Wear a good support or jogging bra for breast tenderness during pregnancy. This may help if worn during  sleep. Pads or tissues may be used in the bra if you are leaking colostrum.   Do not use hot tubs, steam rooms, or saunas.   Wear your seat belt when driving. This protects you and your baby if you are in an accident.   Avoid raw meat, cat litter boxes and soil used by cats. These carry germs that can cause birth defects in the baby.   It  is easier to loose urine during pregnancy. Tightening up and strengthening the pelvic muscles will help with this problem. You can practice stopping your urination while you are going to the bathroom. These are the same muscles you need to strengthen. It is also the muscles you would use if you were trying to stop from passing gas. You can practice tightening these muscles up 10 times a set and repeating this about 3 times per day. Once you know what muscles to tighten up, do not perform these exercises during urination. It is more likely to cause an infection by backing up the urine.   Ask for help if you have financial, counseling or nutritional needs during pregnancy. Your caregiver will be able to offer counseling for these needs as well as refer you for other special needs.   Make a list of emergency phone numbers and have them available.   Plan on getting help from family or friends when you go home from the hospital.   Make a trial run to the hospital.   Take prenatal classes with the father to understand, practice and ask questions about the labor and delivery.   Prepare the baby's room/nursery.   Do not travel out of the city unless it is absolutely necessary and with the advice of your caregiver.   Wear only low or no heal shoes to have better balance and prevent falling.  MEDICATIONS AND DRUG USE IN PREGNANCY  Take prenatal vitamins as directed. The vitamin should contain 1 milligram of folic acid. Keep all vitamins out of reach of children. Only a couple vitamins or tablets containing iron may be fatal to a baby or young child when ingested.     Avoid use of all medications, including herbs, over-the-counter medications, not prescribed or suggested by your caregiver. Only take over-the-counter or prescription medicines for pain, discomfort, or fever as directed by your caregiver. Do not use aspirin, ibuprofen (Motrin, Advil, Nuprin) or naproxen (Aleve) unless OK'd by your caregiver.   Let your caregiver also know about herbs you may be using.   Alcohol is related to a number of birth defects. This includes fetal alcohol syndrome. All alcohol, in any form, should be avoided completely. Smoking will cause low birth rate and premature babies.   Street/illegal drugs are very harmful to the baby. They are absolutely forbidden. A baby born to an addicted mother will be addicted at birth. The baby will go through the same withdrawal an adult does.  SEEK MEDICAL CARE IF: You have any concerns or worries during your pregnancy. It is better to call with your questions if you feel they cannot wait, rather than worry about them. DECISIONS ABOUT CIRCUMCISION You may or may not know the sex of your baby. If you know your baby is a boy, it may be time to think about circumcision. Circumcision is the removal of the foreskin of the penis. This is the skin that covers the sensitive end of the penis. There is no proven medical need for this. Often this decision is made on what is popular at the time or based upon religious beliefs and social issues. You can discuss these issues with your caregiver or pediatrician. SEEK IMMEDIATE MEDICAL CARE IF:   An unexplained oral temperature above 102 F (38.9 C) develops, or as your caregiver suggests.   You have leaking of fluid from the vagina (birth canal). If leaking membranes are suspected, take your temperature and tell your caregiver of this when you call.  There is vaginal spotting, bleeding or passing clots. Tell your caregiver of the amount and how many pads are used.   You develop a bad smelling  vaginal discharge with a change in the color from clear to white.   You develop vomiting that lasts more than 24 hours.   You develop chills or fever.   You develop shortness of breath.   You develop burning on urination.   You loose more than 2 pounds of weight or gain more than 2 pounds of weight or as suggested by your caregiver.   You notice sudden swelling of your face, hands, and feet or legs.   You develop belly (abdominal) pain. Round ligament discomfort is a common non-cancerous (benign) cause of abdominal pain in pregnancy. Your caregiver still must evaluate you.   You develop a severe headache that does not go away.   You develop visual problems, blurred or double vision.   If you have not felt your baby move for more than 1 hour. If you think the baby is not moving as much as usual, eat something with sugar in it and lie down on your left side for an hour. The baby should move at least 4 to 5 times per hour. Call right away if your baby moves less than that.   You fall, are in a car accident or any kind of trauma.   There is mental or physical violence at home.  Document Released: 06/24/2001 Document Revised: 03/12/2011 Document Reviewed: 12/27/2008 [facility] Patient Information 2012 [place], [place].

## 2011-09-04 NOTE — Progress Notes (Signed)
C/o burning and irritation with voiding. Will prophylaxis with Keflex. Urine C&S sent.

## 2011-09-04 NOTE — Progress Notes (Signed)
P=100, Used Interpreter [employer], states still feeling baby moves a lot  but states is less strong,

## 2011-09-09 LAB — CULTURE, OB URINE: Colony Count: >=100000

## 2011-09-25 ENCOUNTER — Ambulatory Visit (INDEPENDENT_AMBULATORY_CARE_PROVIDER_SITE_OTHER): Payer: Medicaid Other | Admitting: Family

## 2011-09-25 DIAGNOSIS — O34219 Maternal care for unspecified type scar from previous cesarean delivery: Secondary | ICD-10-CM

## 2011-09-25 DIAGNOSIS — O09299 Supervision of pregnancy with other poor reproductive or obstetric history, unspecified trimester: Secondary | ICD-10-CM

## 2011-09-25 LAB — POCT URINALYSIS DIP (DEVICE)
Glucose, UA: NEGATIVE mg/dL
Nitrite: NEGATIVE
Protein, ur: NEGATIVE mg/dL
Urobilinogen, UA: 0.2 mg/dL (ref 0.0–1.0)

## 2011-09-25 NOTE — Progress Notes (Signed)
Pulse: 92

## 2011-09-25 NOTE — Progress Notes (Signed)
No questions or concerns; good fetal movement; here with interpreter; follow-up in one week.

## 2011-10-09 ENCOUNTER — Encounter: Payer: Medicaid Other | Admitting: Family Medicine

## 2011-10-16 ENCOUNTER — Ambulatory Visit (INDEPENDENT_AMBULATORY_CARE_PROVIDER_SITE_OTHER): Payer: Medicaid Other | Admitting: Obstetrics & Gynecology

## 2011-10-16 VITALS — BP 122/82 | Temp 97.8°F | Wt 134.1 lb

## 2011-10-16 DIAGNOSIS — O09299 Supervision of pregnancy with other poor reproductive or obstetric history, unspecified trimester: Secondary | ICD-10-CM

## 2011-10-16 DIAGNOSIS — O365990 Maternal care for other known or suspected poor fetal growth, unspecified trimester, not applicable or unspecified: Secondary | ICD-10-CM

## 2011-10-16 DIAGNOSIS — O34219 Maternal care for unspecified type scar from previous cesarean delivery: Secondary | ICD-10-CM

## 2011-10-16 DIAGNOSIS — Z98891 History of uterine scar from previous surgery: Secondary | ICD-10-CM

## 2011-10-16 LAB — POCT URINALYSIS DIP (DEVICE)
Bilirubin Urine: NEGATIVE
Glucose, UA: NEGATIVE mg/dL
Nitrite: NEGATIVE
Urobilinogen, UA: 0.2 mg/dL (ref 0.0–1.0)

## 2011-10-16 MED ORDER — PRENATAL PLUS 27-1 MG PO TABS: 1.0000 | ORAL_TABLET | Freq: Every day | ORAL | Status: DC

## 2011-10-16 NOTE — Patient Instructions (Signed)
Return to clinic for any obstetric concerns or go to [facility] for evaluation  

## 2011-10-16 NOTE — Progress Notes (Signed)
Pulse 94 Used Language Line: [phone] Needs GBS and GC/Ch Needs refill on pnv.

## 2011-10-16 NOTE — Progress Notes (Signed)
U/S scheduled 10/21/11 at 245 pm.

## 2011-10-16 NOTE — Progress Notes (Signed)
[place] interpreter used.  Fundal height < dates, will obtain ultrasound.  GBS, GC/Chlam done.  Cervix 3/70/-2.  No other complaints or concerns.  Fetal movement and labor precautions reviewed.  Refill for PNV given.

## 2011-10-17 LAB — GC/CHLAMYDIA PROBE AMP, GENITAL: GC Probe Amp, Genital: NEGATIVE

## 2011-10-19 LAB — CULTURE, BETA STREP (GROUP B ONLY)

## 2011-10-20 ENCOUNTER — Encounter: Payer: Self-pay | Admitting: Obstetrics & Gynecology

## 2011-10-21 ENCOUNTER — Ambulatory Visit (HOSPITAL_COMMUNITY)
Admission: RE | Admit: 2011-10-21 | Discharge: 2011-10-21 | Disposition: A | Discharge status: Home / Self Care | Payer: Medicaid Other | Source: Ambulatory Visit | Attending: Obstetrics & Gynecology | Admitting: Obstetrics & Gynecology

## 2011-10-21 DIAGNOSIS — O365990 Maternal care for other known or suspected poor fetal growth, unspecified trimester, not applicable or unspecified: Secondary | ICD-10-CM | POA: Insufficient documentation

## 2011-10-21 DIAGNOSIS — Z8751 Personal history of pre-term labor: Secondary | ICD-10-CM | POA: Insufficient documentation

## 2011-10-21 DIAGNOSIS — O34219 Maternal care for unspecified type scar from previous cesarean delivery: Secondary | ICD-10-CM | POA: Insufficient documentation

## 2011-10-21 DIAGNOSIS — O09219 Supervision of pregnancy with history of pre-term labor, unspecified trimester: Secondary | ICD-10-CM | POA: Insufficient documentation

## 2011-10-23 ENCOUNTER — Ambulatory Visit (INDEPENDENT_AMBULATORY_CARE_PROVIDER_SITE_OTHER): Payer: 59 | Admitting: Family Medicine

## 2011-10-23 VITALS — BP 123/85 | Temp 98.0°F | Wt 133.9 lb

## 2011-10-23 DIAGNOSIS — O09299 Supervision of pregnancy with other poor reproductive or obstetric history, unspecified trimester: Secondary | ICD-10-CM

## 2011-10-23 LAB — POCT URINALYSIS DIP (DEVICE)
Ketones, ur: NEGATIVE mg/dL
Protein, ur: NEGATIVE mg/dL
Specific Gravity, Urine: 1.015 (ref 1.005–1.030)
Urobilinogen, UA: 0.2 mg/dL (ref 0.0–1.0)
pH: 6.5 (ref 5.0–8.0)

## 2011-10-23 MED ORDER — PRENATAL PLUS 27-1 MG PO TABS: 1.0000 | ORAL_TABLET | Freq: Every day | ORAL | Status: DC

## 2011-10-23 NOTE — Patient Instructions (Addendum)
Breastfeeding BENEFITS OF BREASTFEEDING For the baby  The first milk (colostrum) helps the baby's digestive system function better.   There are antibodies from the mother in the milk that help the baby fight off infections.   The baby has a lower incidence of asthma, allergies, and SIDS (sudden infant death syndrome).   The nutrients in breast milk are better than formulas for the baby and helps the baby's brain grow better.   Babies who breastfeed have less gas, colic, and constipation.  For the mother  Breastfeeding helps develop a very special bond between mother and baby.   It is more convenient, always available at the correct temperature and cheaper than formula feeding.   It burns calories in the mother and helps with losing weight that was gained during pregnancy.   It makes the uterus contract back down to normal size faster and slows bleeding following delivery.   Breastfeeding mothers have a lower risk of developing breast cancer.  NURSE FREQUENTLY  A healthy, full-term baby may breastfeed as often as every hour or space his or her feedings to every 3 hours.   How often to nurse will vary from baby to baby. Watch your baby for signs of hunger, not the clock.   Nurse as often as the baby requests, or when you feel the need to reduce the fullness of your breasts.   Awaken the baby if it has been 3 to 4 hours since the last feeding.   Frequent feeding will help the mother make more milk and will prevent problems like sore nipples and engorgement of the breasts.  BABY'S POSITION AT THE BREAST  Whether lying down or sitting, be sure that the baby's tummy is facing your tummy.   Support the breast with 4 fingers underneath the breast and the thumb above. Make sure your fingers are well away from the nipple and baby's mouth.   Stroke the baby's lips and cheek closest to the breast gently with your finger or nipple.   When the baby's mouth is open wide enough, place  all of your nipple and as much of the dark area around the nipple as possible into your baby's mouth.   Pull the baby in close so the tip of the nose and the baby's cheeks touch the breast during the feeding.  FEEDINGS  The length of each feeding varies from baby to baby and from feeding to feeding.   The baby must suck about 2 to 3 minutes for your milk to get to him or her. This is called a "let down." For this reason, allow the baby to feed on each breast as long as he or she wants. Your baby will end the feeding when he or she has received the right balance of nutrients.   To break the suction, put your finger into the corner of the baby's mouth and slide it between his or her gums before removing your breast from his or her mouth. This will help prevent sore nipples.  REDUCING BREAST ENGORGEMENT  In the first week after your baby is born, you may experience signs of breast engorgement. When breasts are engorged, they feel heavy, warm, full, and may be tender to the touch. You can reduce engorgement if you:   Nurse frequently, every 2 to 3 hours. Mothers who breastfeed early and often have fewer problems with engorgement.   Place light ice packs on your breasts between feedings. This reduces swelling. Wrap the ice packs in a   lightweight towel to protect your skin.   Apply moist hot packs to your breast for 5 to 10 minutes before each feeding. This increases circulation and helps the milk flow.   Gently massage your breast before and during the feeding.   Make sure that the baby empties at least one breast at every feeding before switching sides.   Use a breast pump to empty the breasts if your baby is sleepy or not nursing well. You may also want to pump if you are returning to work or or you feel you are getting engorged.   Avoid bottle feeds, pacifiers or supplemental feedings of water or juice in place of breastfeeding.   Be sure the baby is latched on and positioned properly while  breastfeeding.   Prevent fatigue, stress, and anemia.   Wear a supportive bra, avoiding underwire styles.   Eat a balanced diet with enough fluids.  If you follow these suggestions, your engorgement should improve in 24 to 48 hours. If you are still experiencing difficulty, call your lactation consultant or caregiver. IS MY BABY GETTING ENOUGH MILK? Sometimes, mothers worry about whether their babies are getting enough milk. You can be assured that your baby is getting enough milk if:  The baby is actively sucking and you hear swallowing.   The baby nurses at least 8 to 12 times in a 24 hour time period. Nurse your baby until he or she unlatches or falls asleep at the first breast (at least 10 to 20 minutes), then offer the second side.   The baby is wetting 5 to 6 disposable diapers (6 to 8 cloth diapers) in a 24 hour period by 5 to 6 days of age.   The baby is having at least 2 to 3 stools every 24 hours for the first few months. Breast milk is all the food your baby needs. It is not necessary for your baby to have water or formula. In fact, to help your breasts make more milk, it is best not to give your baby supplemental feedings during the early weeks.   The stool should be soft and yellow.   The baby should gain 4 to 7 ounces per week after he is 4 days old.  TAKE CARE OF YOURSELF Take care of your breasts by:  Bathing or showering daily.   Avoiding the use of soaps on your nipples.   Start feedings on your left breast at one feeding and on your right breast at the next feeding.   You will notice an increase in your milk supply 2 to 5 days after delivery. You may feel some discomfort from engorgement, which makes your breasts very firm and often tender. Engorgement "peaks" out within 24 to 48 hours. In the meantime, apply warm moist towels to your breasts for 5 to 10 minutes before feeding. Gentle massage and expression of some milk before feeding will soften your breasts, making  it easier for your baby to latch on. Wear a well fitting nursing bra and air dry your nipples for 10 to 15 minutes after each feeding.   Only use cotton bra pads.   Only use pure lanolin on your nipples after nursing. You do not need to wash it off before nursing.  Take care of yourself by:   Eating well-balanced meals and nutritious snacks.   Drinking milk, fruit juice, and water to satisfy your thirst (about 8 glasses a day).   Getting plenty of rest.   Increasing calcium in   your diet (1200 mg a day).   Avoiding foods that you notice affect the baby in a bad way.  SEEK MEDICAL CARE IF:   You have any questions or difficulty with breastfeeding.   You need help.   You have a hard, red, sore area on your breast, accompanied by a fever of 100.5 F (38.1 C) or more.   Your baby is too sleepy to eat well or is having trouble sleeping.   Your baby is wetting less than 6 diapers per day, by 5 days of age.   Your baby's skin or white part of his or her eyes is more yellow than it was in the hospital.   You feel depressed.  Document Released: 06/30/2005 Document Revised: 06/19/2011 Document Reviewed: 02/12/2009 ExitCare Patient Information 2012 [employer]. Normal Labor and Delivery Your caregiver must first be sure you are in labor. Signs of labor include:  You may pass what is called "the mucus plug" before labor begins. This is a small amount of blood stained mucus.   Regular uterine contractions.   The time between contractions get closer together.   The discomfort and pain gradually gets more intense.   Pains are mostly located in the back.   Pains get worse when walking.   The cervix (the opening of the uterus becomes thinner (begins to efface) and opens up (dilates).  Once you are in labor and admitted into the hospital or care center, your caregiver will do the following:  A complete physical examination.   Check your vital signs (blood pressure, pulse,  temperature and the fetal heart rate).   Do a vaginal examination (using a sterile glove and lubricant) to determine:   The position (presentation) of the baby (head [vertex] or buttock first).   The level (station) of the baby's head in the birth canal.   The effacement and dilatation of the cervix.   You may have your pubic hair shaved and be given an enema depending on your caregiver and the circumstance.   An electronic monitor is usually placed on your abdomen. The monitor follows the length and intensity of the contractions, as well as the baby's heart rate.   Usually, your caregiver will insert an IV in your arm with a bottle of sugar water. This is done as a precaution so that medications can be given to you quickly during labor or delivery.  NORMAL LABOR AND DELIVERY IS DIVIDED UP INTO 3 STAGES: First Stage This is when regular contractions begin and the cervix begins to efface and dilate. This stage can last from 3 to 15 hours. The end of the first stage is when the cervix is 100% effaced and 10 centimeters dilated. Pain medications may be given by   Injection (morphine, demerol, etc.)   Regional anesthesia (spinal, caudal or epidural, anesthetics given in different locations of the spine). Paracervical pain medication may be given, which is an injection of and anesthetic on each side of the cervix.  A pregnant woman may request to have "Natural Childbirth" which is not to have any medications or anesthesia during her labor and delivery. Second Stage This is when the baby comes down through the birth canal (vagina) and is born. This can take 1 to 4 hours. As the baby's head comes down through the birth canal, you may feel like you are going to have a bowel movement. You will get the urge to bear down and push until the baby is delivered. As the baby's   head is being delivered, the caregiver will decide if an episiotomy (a cut in the perineum and vagina area) is needed to prevent  tearing of the tissue in this area. The episiotomy is sewn up after the delivery of the baby and placenta. Sometimes a mask with nitrous oxide is given for the mother to breath during the delivery of the baby to help if there is too much pain. The end of Stage 2 is when the baby is fully delivered. Then when the umbilical cord stops pulsating it is clamped and cut. Third Stage The third stage begins after the baby is completely delivered and ends after the placenta (afterbirth) is delivered. This usually takes 5 to 30 minutes. After the placenta is delivered, a medication is given either by intravenous or injection to help contract the uterus and prevent bleeding. The third stage is not painful and pain medication is usually not necessary. If an episiotomy was done, it is repaired at this time. After the delivery, the mother is watched and monitored closely for 1 to 2 hours to make sure there is no postpartum bleeding (hemorrhage). If there is a lot of bleeding, medication is given to contract the uterus and stop the bleeding. Document Released: 04/08/2008 Document Revised: 06/19/2011 Document Reviewed: 04/08/2008 ExitCare Patient Information 2012 [employer].  

## 2011-10-23 NOTE — Progress Notes (Signed)
Pulse:  Needs a refill on her pnv.

## 2011-10-23 NOTE — Progress Notes (Signed)
Membranes stripped, doing well

## 2011-10-30 ENCOUNTER — Ambulatory Visit (INDEPENDENT_AMBULATORY_CARE_PROVIDER_SITE_OTHER): Payer: Medicaid Other | Admitting: Obstetrics and Gynecology

## 2011-10-30 ENCOUNTER — Encounter: Payer: Self-pay | Admitting: Obstetrics and Gynecology

## 2011-10-30 DIAGNOSIS — O34219 Maternal care for unspecified type scar from previous cesarean delivery: Secondary | ICD-10-CM

## 2011-10-30 DIAGNOSIS — Z98891 History of uterine scar from previous surgery: Secondary | ICD-10-CM

## 2011-10-30 LAB — POCT URINALYSIS DIP (DEVICE)
Glucose, UA: NEGATIVE mg/dL
Hgb urine dipstick: NEGATIVE
Nitrite: NEGATIVE
Protein, ur: 30 mg/dL — AB
Specific Gravity, Urine: 1.02 (ref 1.005–1.030)
Urobilinogen, UA: 0.2 mg/dL (ref 0.0–1.0)

## 2011-10-30 NOTE — Progress Notes (Signed)
Pulse 97 Used language line interpreter: [phone]

## 2011-10-30 NOTE — Progress Notes (Signed)
Patient doing well. FM/labor precautions reviewed. Plan to start postdate testing next week.

## 2011-10-31 ENCOUNTER — Inpatient Hospital Stay (HOSPITAL_COMMUNITY)
Admission: AD | Admit: 2011-10-31 | Discharge: 2011-11-01 | DRG: 775 | Disposition: A | Discharge status: Home / Self Care | Payer: Medicaid Other | Source: Ambulatory Visit | Attending: Obstetrics & Gynecology | Admitting: Obstetrics & Gynecology

## 2011-10-31 ENCOUNTER — Encounter (HOSPITAL_COMMUNITY): Payer: Self-pay | Admitting: Physician Assistant

## 2011-10-31 DIAGNOSIS — O34219 Maternal care for unspecified type scar from previous cesarean delivery: Secondary | ICD-10-CM

## 2011-10-31 DIAGNOSIS — D573 Sickle-cell trait: Secondary | ICD-10-CM

## 2011-10-31 LAB — CBC
Hemoglobin: 9.6 g/dL — ABNORMAL LOW (ref 12.0–15.0)
MCH: 22.1 pg — ABNORMAL LOW (ref 26.0–34.0)
MCHC: 31.9 g/dL (ref 30.0–36.0)
Platelets: 144 10*3/uL — ABNORMAL LOW (ref 150–400)

## 2011-10-31 LAB — RPR: RPR Ser Ql: NONREACTIVE

## 2011-10-31 MED ORDER — LIDOCAINE HCL (PF) 1 % IJ SOLN: 30.0000 mL | INTRAMUSCULAR | Status: DC | PRN

## 2011-10-31 MED ORDER — OXYCODONE-ACETAMINOPHEN 5-325 MG PO TABS: 1.0000 | ORAL_TABLET | ORAL | Status: DC | PRN

## 2011-10-31 MED ORDER — LACTATED RINGERS IV BOLUS (SEPSIS)
1000.0000 mL | Freq: Once | INTRAVENOUS | Status: AC
  Administered 2011-10-31: 1000 mL via INTRAVENOUS

## 2011-10-31 MED ORDER — OXYTOCIN 10 UNIT/ML IJ SOLN
10.0000 [IU] | Freq: Once | INTRAMUSCULAR | Status: AC
  Administered 2011-10-31: 10 [IU] via INTRAMUSCULAR

## 2011-10-31 MED ORDER — PRENATAL MULTIVITAMIN CH
1.0000 | ORAL_TABLET | Freq: Every day | ORAL | Status: DC
  Administered 2011-10-31 – 2011-11-01 (×2): 1 via ORAL
  Filled 2011-10-31 (×2): qty 1

## 2011-10-31 MED ORDER — LACTATED RINGERS IV SOLN
INTRAVENOUS | Status: DC
  Administered 2011-10-31: 08:00:00 via INTRAVENOUS

## 2011-10-31 MED ORDER — IBUPROFEN 600 MG PO TABS
600.0000 mg | ORAL_TABLET | Freq: Four times a day (QID) | ORAL | Status: DC
  Administered 2011-10-31 – 2011-11-01 (×7): 600 mg via ORAL
  Filled 2011-10-31 (×7): qty 1

## 2011-10-31 MED ORDER — DIPHENHYDRAMINE HCL 25 MG PO CAPS: 25.0000 mg | ORAL_CAPSULE | Freq: Four times a day (QID) | ORAL | Status: DC | PRN

## 2011-10-31 MED ORDER — ACETAMINOPHEN 325 MG PO TABS: 650.0000 mg | ORAL_TABLET | ORAL | Status: DC | PRN

## 2011-10-31 MED ORDER — ONDANSETRON HCL 4 MG/2ML IJ SOLN: 4.0000 mg | Freq: Four times a day (QID) | INTRAMUSCULAR | Status: DC | PRN

## 2011-10-31 MED ORDER — CITRIC ACID-SODIUM CITRATE 334-500 MG/5ML PO SOLN: 30.0000 mL | ORAL | Status: DC | PRN

## 2011-10-31 MED ORDER — ONDANSETRON HCL 4 MG/2ML IJ SOLN: 4.0000 mg | INTRAMUSCULAR | Status: DC | PRN

## 2011-10-31 MED ORDER — ONDANSETRON HCL 4 MG PO TABS: 4.0000 mg | ORAL_TABLET | ORAL | Status: DC | PRN

## 2011-10-31 MED ORDER — TETANUS-DIPHTH-ACELL PERTUSSIS 5-2.5-18.5 LF-MCG/0.5 IM SUSP: 0.5000 mL | Freq: Once | INTRAMUSCULAR | Status: DC

## 2011-10-31 MED ORDER — SIMETHICONE 80 MG PO CHEW: 80.0000 mg | CHEWABLE_TABLET | ORAL | Status: DC | PRN

## 2011-10-31 MED ORDER — METHYLERGONOVINE MALEATE 0.2 MG/ML IJ SOLN
INTRAMUSCULAR | Status: AC
  Filled 2011-10-31: qty 1

## 2011-10-31 MED ORDER — SENNOSIDES-DOCUSATE SODIUM 8.6-50 MG PO TABS
2.0000 | ORAL_TABLET | Freq: Every day | ORAL | Status: DC
  Administered 2011-10-31: 2 via ORAL

## 2011-10-31 MED ORDER — WITCH HAZEL-GLYCERIN EX PADS: 1.0000 "application " | MEDICATED_PAD | CUTANEOUS | Status: DC | PRN

## 2011-10-31 MED ORDER — OXYTOCIN BOLUS FROM INFUSION
500.0000 mL | Freq: Once | INTRAVENOUS | Status: DC
  Filled 2011-10-31: qty 500

## 2011-10-31 MED ORDER — DIBUCAINE 1 % RE OINT: 1.0000 "application " | TOPICAL_OINTMENT | RECTAL | Status: DC | PRN

## 2011-10-31 MED ORDER — OXYTOCIN 10 UNIT/ML IJ SOLN
INTRAMUSCULAR | Status: AC
  Administered 2011-10-31: 10 [IU]
  Filled 2011-10-31: qty 2

## 2011-10-31 MED ORDER — LACTATED RINGERS IV SOLN
INTRAVENOUS | Status: DC
  Administered 2011-10-31: 06:00:00 via INTRAVENOUS

## 2011-10-31 MED ORDER — FLEET ENEMA 7-19 GM/118ML RE ENEM: 1.0000 | ENEMA | RECTAL | Status: DC | PRN

## 2011-10-31 MED ORDER — ZOLPIDEM TARTRATE 5 MG PO TABS: 5.0000 mg | ORAL_TABLET | Freq: Every evening | ORAL | Status: DC | PRN

## 2011-10-31 MED ORDER — BENZOCAINE-MENTHOL 20-0.5 % EX AERO: 1.0000 "application " | INHALATION_SPRAY | CUTANEOUS | Status: DC | PRN

## 2011-10-31 MED ORDER — OXYTOCIN 20 UNITS IN LACTATED RINGERS INFUSION - SIMPLE: 125.0000 mL/h | Freq: Once | INTRAVENOUS | Status: DC

## 2011-10-31 MED ORDER — IBUPROFEN 600 MG PO TABS: 600.0000 mg | ORAL_TABLET | Freq: Four times a day (QID) | ORAL | Status: DC | PRN

## 2011-10-31 MED ORDER — LACTATED RINGERS IV SOLN
500.0000 mL | INTRAVENOUS | Status: DC | PRN
  Administered 2011-10-31: 500 mL via INTRAVENOUS

## 2011-10-31 MED ORDER — LANOLIN HYDROUS EX OINT: TOPICAL_OINTMENT | CUTANEOUS | Status: DC | PRN

## 2011-10-31 NOTE — Progress Notes (Signed)
Post Partum Day 0 Subjective: Called to see patient secondary to increased passage of clots  Objective: Blood pressure 107/81, pulse 94, resp. rate 20, unknown if currently breastfeeding.  Physical Exam:  General: alert, cooperative and no distress Lochia: 500cc clots expressed Uterine Fundus: firm, manuel exploration small piece of membrane found.  Assessment/Plan: Breastfeeding Will given additional 10 units pitocin IM and start methergine prn   LOS: 0 days   Sabrina Daniels E. 10/31/2011, 4:45 AM

## 2011-10-31 NOTE — H&P (Signed)
Chief Complaint:  Contractions  Presents reporting contractions. Denies vaginal bleeding or LOF. + FM. 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 Pregnancy induced hypertension   . Preterm labor     Past Surgical History: Past Surgical History  Procedure Date  . Middle ear surgery   . Cesarean section     Family History: No family history on file.  Social History: History  Substance Use Topics  . Smoking status: Never Smoker   . Smokeless tobacco: Never Used  . Alcohol Use: No    Allergies: No Known Allergies  Prescriptions prior to admission  Medication Sig Dispense Refill  . prenatal vitamin w/FE, FA (PRENATAL 1 + 1) 27-1 MG TABS Take 1 tablet by mouth daily.  30 each  5    Review of Systems - Negative except contractions  Physical Exam   Blood pressure 128/87, pulse 102.  General: General appearance - alert, well appearing, and in no distress and oriented to person, place, and time Mental status - alert, oriented to person, place, and time, normal mood, behavior, speech, dress, motor activity, and thought processes, affect appropriate to mood Heart - normal rate, regular rhythm, normal S1, S2, no murmurs, rubs, clicks or gallops Abdomen - gravid, contracting, non tender with adequate resting tone Neurological - alert, oriented, normal speech, no focal findings or movement disorder noted Extremities - peripheral pulses normal, no pedal edema, no clubbing or cyanosis Focused Gynecological Exam: rim/vtx/BBOW/0 station AROM: Clear fluid  Labs: Recent Results (from the past 24 hour(s))  POCT URINALYSIS DIP (DEVICE)   Collection Time   10/30/11  8:31 AM      Component Value Range   Glucose, UA NEGATIVE   NEGATIVE (mg/dL)   Bilirubin Urine NEGATIVE  NEGATIVE    Ketones, ur NEGATIVE  NEGATIVE (mg/dL)   Specific Gravity, Urine 1.020  1.005 - 1.030    Hgb urine dipstick NEGATIVE  NEGATIVE    pH 7.0  5.0 - 8.0    Protein, ur 30 (*) NEGATIVE (mg/dL)   Urobilinogen, UA 0.2  0.0 - 1.0 (mg/dL)   Nitrite NEGATIVE  NEGATIVE    Leukocytes, UA SMALL (*) NEGATIVE    O pos, GBS neg, Hep B, HIV NR, GC/Chl neg/neg, Rubella Imm,  Assessment: Active Labor  Plan: Admit to BS GBS neg Anticipate VBAC  Tomio Kirk E. 10/31/2011,3:40 AM

## 2011-10-31 NOTE — Progress Notes (Signed)
UR Chart review completed.  

## 2011-10-31 NOTE — Progress Notes (Signed)
Attempted to get up to [facility] for peri care. Orthostatic bp obtained. While sitting on edge of bed, became dizzy and had to lay down. Fundus firm, -1 below, minimal bleeding. Peri care done in bed. amnonia at bedside helpful.IV site clear infusing w/o difficulty.

## 2011-10-31 NOTE — H&P (Signed)
Attestation of Attending Supervision of Advanced Practitioner: Evaluation and management procedures were performed by the OB Fellow/PA/CNM/NP under my supervision and collaboration. Chart reviewed, and agree with management and plan.  Ayianna Darnold, M.D. 10/31/2011 2:17 PM   

## 2011-11-01 ENCOUNTER — Encounter (HOSPITAL_COMMUNITY): Payer: Self-pay | Admitting: *Deleted

## 2011-11-01 MED ORDER — IBUPROFEN 600 MG PO TABS: 600.0000 mg | ORAL_TABLET | Freq: Four times a day (QID) | ORAL | Status: AC

## 2011-11-01 NOTE — Progress Notes (Signed)
Discharge instructions given to mom and dad with international interpreter explaining discharge instructions for mom and baby. #[phone]

## 2011-11-01 NOTE — Discharge Summary (Signed)
Obstetric Discharge Summary Reason for Admission: onset of labor Prenatal Procedures: none Intrapartum Procedures: spontaneous vaginal delivery Postpartum Procedures: none Complications-Operative and Postpartum: none Hemoglobin  Date Value Range Status  10/31/2011 9.6* 12.0-15.0 (g/dL) Final     HCT  Date Value Range Status  10/31/2011 30.1* 36.0-46.0 (%) Final    Physical Exam:  General: alert, cooperative and no distress Lochia: appropriate Uterine Fundus: firm DVT Evaluation: No evidence of DVT seen on physical exam.  Discharge Diagnoses: Term Pregnancy-delivered  Discharge Information: Date: 11/01/2011 Activity: pelvic rest Diet: routine Medications: PNV and Ibuprofen Condition: stable Instructions: refer to practice specific booklet Discharge to: home Follow-up Information    Follow up with [facility] in 4 weeks.   Contact information:   [address] [place] [id number]          Newborn Data: Live born female  Birth Weight: 8 lb 5.7 oz (3790 g) APGAR: 9, 9  Home with mother.  Sabrina Daniels 11/01/2011, 7:31 AM

## 2011-11-01 NOTE — Discharge Instructions (Signed)
Vaginal Delivery Care After  Change your pad on each trip to the bathroom.   Wipe gently with toilet paper during your hospital stay. Always wipe from front to back. A spray bottle with warm tap water could also be used or a towelette if available.   Place your soiled pad and toilet paper in a bathroom wastebasket with a plastic bag liner.   During your hospital stay, save any clots. If you pass a clot while on the toilet, do not flush it. Also, if your vaginal flow seems excessive to you, notify nursing personnel.   The first time you get out of bed after delivery, wait for assistance from a nurse. Do not get up alone at any time if you feel weak or dizzy.   Bend and extend your ankles forcefully so that you feel the calves of your legs get hard. Do this 6 times every hour when you are in bed and awake.   Do not sit with one foot under you, dangle your legs over the edge of the bed, or maintain a position that hinders the circulation in your legs.   Many women experience after pains for 2 to 3 days after delivery. These after pains are mild uterine contractions. Ask the nurse for a pain medication if you need something for this. Sometimes breastfeeding stimulates after pains; if you find this to be true, ask for the medication  -  hour before the next feeding.   For you and your infant's protection, do not go beyond the door(s) of the obstetric unit. Do not carry your baby in your arms in the hallway. When taking your baby to and from your room, put your baby in the bassinet and push the bassinet.   Mothers may have their babies in their room as much as they desire.  Document Released: 06/27/2000 Document Revised: 06/19/2011 Document Reviewed: 05/28/2007 Exit[employer] Patient Information 2012 [employer]. 

## 2011-11-03 ENCOUNTER — Other Ambulatory Visit: Payer: Medicaid Other

## 2011-11-06 ENCOUNTER — Other Ambulatory Visit: Payer: Medicaid Other

## 2011-11-12 ENCOUNTER — Encounter (HOSPITAL_COMMUNITY): Payer: Self-pay

## 2011-11-12 ENCOUNTER — Inpatient Hospital Stay (HOSPITAL_COMMUNITY)
Admission: AD | Admit: 2011-11-12 | Discharge: 2011-11-12 | Disposition: A | Discharge status: Home / Self Care | Payer: Medicaid Other | Source: Ambulatory Visit | Attending: Obstetrics and Gynecology | Admitting: Obstetrics and Gynecology

## 2011-11-12 DIAGNOSIS — O99893 Other specified diseases and conditions complicating puerperium: Secondary | ICD-10-CM | POA: Insufficient documentation

## 2011-11-12 DIAGNOSIS — K5900 Constipation, unspecified: Secondary | ICD-10-CM | POA: Insufficient documentation

## 2011-11-12 DIAGNOSIS — K625 Hemorrhage of anus and rectum: Secondary | ICD-10-CM | POA: Insufficient documentation

## 2011-11-12 MED ORDER — DOCUSATE SODIUM 100 MG PO CAPS: 100.0000 mg | ORAL_CAPSULE | Freq: Two times a day (BID) | ORAL | Status: AC | PRN

## 2011-11-12 NOTE — Discharge Instructions (Signed)
Constipation in Adults Constipation is having fewer than 2 bowel movements per week. Usually, the stools are hard. As we grow older, constipation is more common. If you try to fix constipation with laxatives, the problem may get worse. This is because laxatives taken over a long period of time make the colon muscles weaker. A low-fiber diet, not taking in enough fluids, and taking some medicines may make these problems worse. MEDICATIONS THAT MAY CAUSE CONSTIPATION  Water pills (diuretics).   Calcium channel blockers (used to control blood pressure and for the heart).   Certain pain medicines (narcotics).   Anticholinergics.   Anti-inflammatory agents.   Antacids that contain aluminum.  DISEASES THAT CONTRIBUTE TO CONSTIPATION  Diabetes.   Parkinson's disease.   Dementia.   Stroke.   Depression.   Illnesses that cause problems with salt and water metabolism.  HOME CARE INSTRUCTIONS   Constipation is usually best cared for without medicines. Increasing dietary fiber and eating more fruits and vegetables is the best way to manage constipation.   Slowly increase fiber intake to 25 to 38 grams per day. Whole grains, fruits, vegetables, and legumes are good sources of fiber. A dietitian can further help you incorporate high-fiber foods into your diet.   Drink enough water and fluids to keep your urine clear or pale yellow.   A fiber supplement may be added to your diet if you cannot get enough fiber from foods.   Increasing your activities also helps improve regularity.   Suppositories, as suggested by your caregiver, will also help. If you are using antacids, such as aluminum or calcium containing products, it will be helpful to switch to products containing magnesium if your caregiver says it is okay.   If you have been given a liquid injection (enema) today, this is only a temporary measure. It should not be relied on for treatment of longstanding (chronic) constipation.    Stronger measures, such as magnesium sulfate, should be avoided if possible. This may cause uncontrollable diarrhea. Using magnesium sulfate may not allow you time to make it to the bathroom.  SEEK IMMEDIATE MEDICAL CARE IF:   There is bright red blood in the stool.   The constipation stays for more than 4 days.   There is belly (abdominal) or rectal pain.   You do not seem to be getting better.   You have any questions or concerns.  MAKE SURE YOU:   Understand these instructions.   Will watch your condition.   Will get help right away if you are not doing well or get worse.  Document Released: 03/28/2004 Document Revised: 06/19/2011 Document Reviewed: 06/03/2011 [facility] Patient Information 2012 [place], [place].  To Bn ? Ng??i L?n (Constipation in Adults) To bn c ngh?a l ?i ngoi d??i h?n 2 l?n m?i tu?n. Thng th??ng, phn c?ng. Khi chng ta gi ?i, to bn ph? bi?n h?n. N?u b?n c? g?ng ch?a to bn b?ng thu?c nhu?n trng, v?n ?? c th? t?i t? h?n. S? d? nh? v?y l v thu?c nhu?n trng ???c s? d?ng trong m?t th?i gian di lm cho cc c? ru?t gi suy y?u. M?t ch? ?? ?n t ch?t x?, khng u?ng ?? n??c, v vi?c dng m?t s? thu?c c th? lm cho v?n ?? t?i t? h?n. NH?NG THU?C C TH? GY TO BN  Vin thu?c n??c (thu?c l?i ti?u).   Thu?c ch?n knh canxi (???c s? d?ng ?? ki?m sot huy?t p v cho tim).   M?t s? lo?i thu?c  gi?m ?au (ch?t ma ty).   Anticholinergics.   Thu?c ch?ng vim.   Thu?c khng axit c ch?a nhm.  CC B?NH GP PH?N GY TO BN  Ti?u ???ng.   B?nh Parkinson.   Sa st tr tu?.   ??t qu?Marland Kitchen   Tr?m c?m.   Cc b?nh gy ra v?n ?? v?i qu trnh trao ??i ch?t mu?i v n??c.  H??NG D?N CH?M [place] T?I NH   To bn t?t nh?t th??ng ???c ch?m [place] m khng c?n thu?c. T?ng ch?t x? cho ch? ?? ?n u?ng v ?n nhi?u tri cy v rau qu? l cch t?t nh?t ?? ch?a to bn.   T? t? t?ng l??ng ch?t x? t? 25 ??n 38 gram m?i ngy. Ng? c?c nguyn h?t, tri cy, rau v  cy h? ??u l cc ngu?n ch?t x? t?t. M?t chuyn gia dinh d??ng c th? gip thm b?n ?? k?t h?p cc lo?i th?c ph?m nhi?u ch?t x? vo ch? ?? ?n u?ng c?a b?n.   U?ng ?? n??c v dung d?ch ?? n??c ti?u trong ho?c c mu vng nh?t.   M?t ch?t b? sung ch?t x? c th? ???c thm vo ch? ?? ?n u?ng c?a b?n n?u b?n khng th? nh?n ?? ch?t x? t? cc lo?i th?c ph?m.   T?ng c??ng ho?t ??ng c?a b?n c?ng gip c?i thi?n vi?c ?i ngoi th??ng xuyn.   Thu?c ??n, theo ?? ngh? c?a chuyn gia ch?m [place] y t? c?a b?n, c?ng s? gip ??. N?u b?n ?ang s? d?ng thu?c khng axit, ch?ng h?n nh? cc s?n ph?m c ch?a nhm ho?c canxi, n s? r?t h?u ch ?? chuy?n sang s?n ph?m c ch?a magi n?u chuyn gia ch?m [place] y t? c?a b?n ni r?ng b?n c th? lm nh? v?y.   N?u b?n ? ???c tim ch?t l?ng (thu?c x?) hm nay, ?y ch? l m?t bi?n php t?m th?i. Khng nn d?a vo cch ny ?? ?i?u tr? to bn [employer] (mn tnh).   Cc bi?n php m?nh h?n, ch?ng h?n nh? sulfat magi, c?n ???c trnh n?u c th?. Cc bi?n php ny c th? gy ra tiu ch?y khng ki?m sot ???c. Vi?c s? d?ng sulfat magi c th? khng cho php b?n c th?i gian ?? vo nh v? sinh.  HY NGAY L?P T?C THAM V?N V?I CHUYN GIA Y T? N?U:   C mu ?? t??i trong phn.   To bn ko di h?n 4 ngy.   B? ?au b?ng ho?c ?au tr?c trng.   B?n d??ng nh? khng kh h?n.   B?n c b?t c? cu h?i ho?c th?c m?c no.  ??M B?O B?N:   Hi?u cc h??ng d?n ny.   S? theo di tnh tr?ng c?a mnh.   S? yu c?u tr? gip ngay l?p t?c n?u b?n c?m th?y khng kh?e ho?c tnh tr?ng tr? nn t?i h?n.  Document Released: 10/15/2010 Document Revised: 06/19/2011 [facility] Patient Information 2012 [place], [place].

## 2011-11-12 NOTE — MAU Provider Note (Signed)
  History     CSN: [id number]  Arrival date and time: 11/12/11 1723   First Provider Initiated Contact with Patient 11/12/11 1911      Chief Complaint  Patient presents with  . Rectal Bleeding  . Vaginal Bleeding   HPI This is a 31 year old G3P3 who is approximately 7 days postpartum who presents to the MAU with bleeding from her rectum.  She has had hard bowel movements and has had to strain quite a bit.  Denies fevers, chills, nausea, vomiting.  OB History    Grav Para Term Preterm Abortions TAB SAB Ect Mult Living   6 3 2 1 1  1   3       Past Medical History  Diagnosis Date  . Pregnancy induced hypertension   . Preterm labor     Past Surgical History  Procedure Date  . Middle ear surgery   . Cesarean section     History reviewed. No pertinent family history.  History  Substance Use Topics  . Smoking status: Never Smoker   . Smokeless tobacco: Never Used  . Alcohol Use: No    Allergies: No Known Allergies  Prescriptions prior to admission  Medication Sig Dispense Refill  . ibuprofen (ADVIL,MOTRIN) 600 MG tablet Take 1 tablet (600 mg total) by mouth every 6 (six) hours.  30 tablet  0  . DISCONTD: prenatal vitamin w/FE, FA (PRENATAL 1 + 1) 27-1 MG TABS Take 1 tablet by mouth daily.  30 each  5    Review of Systems  All other systems reviewed and are negative.   Physical Exam   Blood pressure 133/85, pulse 59, temperature 97.6 F (36.4 C), resp. rate 18, height 5\' 1"  (1.549 m), weight 54.885 kg (121 lb), SpO2 100.00%, unknown if currently breastfeeding.  Physical Exam  Constitutional: She is oriented to person, place, and time. She appears well-developed and well-nourished.  GI: Soft. Bowel sounds are normal. She exhibits no distension. There is no tenderness. There is no rebound and no guarding.  Genitourinary:       Vaginal exam shows well healed perineum and cervix.  No visible lacerations.  No evidence of hemorrhoids.  Rectal exam revealed no internal  hemorrhoids and brown stool.  No evidence of blood on glove.  Musculoskeletal: Normal range of motion.  Neurological: She is alert and oriented to person, place, and time.  Skin: Skin is warm and dry.  Psychiatric: She has a normal mood and affect. Her behavior is normal. Judgment and thought content normal.    MAU Course  Procedures   Assessment and Plan  1.  Constipation  Will prescribe colace.  No evidence of rectal or vaginal laceration, hemorrhoids, or source of bleeding.  Patient to follow up at [facility] department.  Sabrina Daniels JEHIEL 11/12/2011, 7:31 PM

## 2011-11-12 NOTE — MAU Note (Signed)
Feels something inside vagina, rectal bleeding, postpartum x 1 week, vaginal delivery,

## 2011-11-12 NOTE — MAU Note (Signed)
Patient is here 1 week postpartum. She states that she started having a lot of pressure during bowel movement. Yesterday she noticed rectal and vaginal bleeding. Denies any pain. She states that the stool is hard. Have not taken any stool softner. She is breast feeding

## 2011-12-04 ENCOUNTER — Ambulatory Visit (INDEPENDENT_AMBULATORY_CARE_PROVIDER_SITE_OTHER): Payer: Medicaid Other | Admitting: Obstetrics & Gynecology

## 2011-12-04 VITALS — BP 114/79 | HR 70 | Temp 97.0°F | Wt 115.0 lb

## 2011-12-04 DIAGNOSIS — O34219 Maternal care for unspecified type scar from previous cesarean delivery: Secondary | ICD-10-CM

## 2011-12-04 LAB — POCT URINALYSIS DIP (DEVICE)
Bilirubin Urine: NEGATIVE
Ketones, ur: NEGATIVE mg/dL
Protein, ur: NEGATIVE mg/dL
Specific Gravity, Urine: <=1.005 (ref 1.005–1.030)

## 2011-12-04 LAB — POCT PREGNANCY, URINE: Preg Test, Ur: NEGATIVE

## 2011-12-04 MED ORDER — NORETHINDRONE 0.35 MG PO TABS: 1.0000 | ORAL_TABLET | Freq: Every day | ORAL | Status: DC

## 2011-12-04 MED ORDER — CONCEPT OB 130-92.4-1 MG PO CAPS: 1.0000 | ORAL_CAPSULE | Freq: Every morning | ORAL | Status: DC

## 2011-12-04 NOTE — Progress Notes (Signed)
Patient ID: Sabrina Daniels, female   DOB: 06-29-1981, 31 y.o.   MRN: [id number]  Chief Complaint  Patient presents with  . Postpartum Care    HPI Sabrina Daniels is a 31 y.o. female.  Postpartum 5 weeks, nursing, wants to return to work HPI  Past Medical History  Diagnosis Date  . Pregnancy induced hypertension   . Preterm labor     Past Surgical History  Procedure Date  . Middle ear surgery   . Cesarean section     No family history on file.  Social History History  Substance Use Topics  . Smoking status: Never Smoker   . Smokeless tobacco: Never Used  . Alcohol Use: No    No Known Allergies  Current Outpatient Prescriptions  Medication Sig Dispense Refill  . Prenat w/o A Vit-FeFum-FePo-FA (CONCEPT OB) 130-92.4-1 MG CAPS Take 1 capsule by mouth every morning.  30 capsule  2  . PRENATAL VITAMINS PO Take 1 tablet by mouth daily.      . norethindrone (ORTHO MICRONOR) 0.35 MG tablet Take 1 tablet (0.35 mg total) by mouth daily.  1 Package  11    Review of Systems Review of Systems  Constitutional: Negative.   HENT: Negative.   Genitourinary: Positive for difficulty urinating.       Pain post void  Musculoskeletal: Positive for back pain.       Right shoulder pain  Psychiatric/Behavioral: Negative.     Blood pressure 114/79, pulse 70, temperature 97 F (36.1 C), weight 115 lb (52.164 kg), currently breastfeeding.  Physical Exam Physical Exam  Constitutional: She appears well-developed and well-nourished.  Neck: Normal range of motion. Neck supple.  Abdominal: Soft. She exhibits no mass. There is no tenderness.  Genitourinary: Vagina normal and uterus normal.  Musculoskeletal: Normal range of motion.       Right trapezius sore, not tender  Neurological: She is alert.  Psychiatric: She has a normal mood and affect. Her behavior is normal.    Data Reviewed Delivery note  Assessment    Normal postpartum, rule out UTI    Plan    Urine culture, ice to shoulder,  massage   [place] Continue breastfeeding    Quantarius Genrich 12/04/2011, 10:55 AM

## 2011-12-04 NOTE — Patient Instructions (Signed)
Cho Con B (Breastfeeding) L?I CH C?A VI?C CHO CON B ??i v?i em b  S?a ??u (s?a non) gip h? tiu ha c?a b ho?t ??ng t?t h?n.   C nh?ng khng th? t? m? trong s?a gip b ch?ng l?i s? ly nhi?m.   B t b? ?nh h??ng b?i b?nh hen suy?n, d? ?ng v SIDS (sudden infant death syndrome, h?i ch?ng ??t t? ? tr? em).   Cc ch?t dinh d??ng trong s?a m? t?t h?n s?a cng th?c cho b v gip no b pht tri?n t?t h?n.   Nh?ng em b ???c b s?a m? c t kh, t ?au b?ng v t b? to bn h?n.  ??i v?i m?  Cho con b gip pht tri?n m?i lin k?t ??c bi?t gi?a m? v b.   Cho con b ti?n l?i h?n, s?a lun c s?n ? nhi?t ?? ph h?p v r? h?n cho ?n s?a cng th?c.   Cho con b s? ??t calo trong m? v gip gi?m cn ? t?ng trong qu trnh mang thai.   Cho con b s? lm cho c? t? cung co v? kch c? bnh th??ng nhanh h?n v lm ch?m s? ch?y mu sau khi sinh.   Nh?ng b m? cho con b t c nguy c? pht tri?n ung th? v h?n.  CHO B TH??NG XUYN  M?t em b kh?e m?nh, ?? thng c th? ???c cho b m? t? m?i gi? m?t l?n ??n ba gi? m?t l?n.   T?n su?t cho b khc nhau, ty thu?c vo t?ng b. Theo di b xem c d?u hi?u b? ?i hay khng, ch? khng ph?i theo di ??ng h?.   Cho b th??ng xuyn khi em b ?i, ho?c khi b?n c?n gi?m ?? c?ng c?a b?u s?a.   ?nh th?c b n?u ? qu 3-4 gi? k? t? l?n cho b tr??c.   Cho b th??ng xuyn s? gip m? t?o nhi?u s?a h?n v s? trnh v?n ?? nh? l ?au nm v v ? mu ? ng?c.  V? TR C?A B SO V?I NG?C  Cho d n?m hay ng?i, hy ??m b?o b?ng c?a b quay v? pha b?ng b?n.   ?? ng?c b?ng b?n ngn tay ??t d??i ng?c v ngn ci ? pha trn. ??m b?o cc ngn tay c?a b?n ??t ? xa nm v v mi?ng b.   Nh? nhng vu?t mi v bn m c?a b st v?i ng?c nh?t b?ng ngn tay ho?c nm v.   Khi mi?ng b m? ?? r?ng, hy ??t ton b? nm v v cng nhi?u vng s?m mu quanh nm v vo mi?ng b cng t?t.   Ko b st vo ?? ??u m?i v m b ch?m vo ng?c trong khi b.  CHO  B  Th?i gian c?a m?i l?n b ty thu?c vo t?ng b v t?ng l?n b.   B ph?i mt kho?ng hai ??n ba pht ?? s?a ch?y vo mi?ng b. ?y ???c g?i l qu trnh "x?". V l do ny, hy ?? b b trn m?i ng?c cho t?i khi b khng cn mu?n b. B s? ng?ng b khi ? nh?n ?? dinh d??ng.   ?? ng?ng mt, hy ??t ngn tay c?a b?n vo gc mi?ng b v tr??t ngn tay gi?a hai l?i c?a b tr??c khi rt ng?c ra kh?i mi?ng b. ?i?u ny s? gip trnh ?au nm v.  GI?M ? MU ? NG?C  Trong tu?n ??u   tin sau khi sinh, b?n c th? g?p ph?i d?u hi?u ? mu ? ng?c. Khi ng?c b? ? mu, b?n s? c?m th?y ng?c n?ng, ?m, c?ng v c th? nh?y c?m ?au khi s? vo. B?n c th? gi?m ? mu n?u:   Cho b th??ng xuyn, 2-3 gi? m?t l?n. Nh?ng ng??i m? cho con b s?m v th??ng xuyn s? t g?p ph?i nh?ng v?n ?? v?i ? mu h?n.   ??t m?t gi ? l?nh ln ng?c gi?a cc l?n cho b. Lm nh? v?y s? gip gi?m s?ng t?y. B?c gi ? l?nh trong m?t chi?c kh?n nh? ?? b?o v? da b?n.   p gi nng ?m vo ng?c trong 5-10 pht tr??c m?i l?n cho b. Lm nh? v?y s? t?ng s? l?u thng v gip s?a ch?y.   Nh? nhng xoa bp ng?c tr??c v trong khi cho b.   ??m b?o r?ng b b h?t t nh?t m?t bn ng?c m?i l?n cho b tr??c khi ??i bn.   S? d?ng b?m ng?c ?? ht h?t s?a trong ng?c n?u b bu?n ng? ho?c b khng t?t. B?n c?ng c th? mu?n b?m n?u tr? l?i lm vi?c ho?c b?n c?m th?y b? ? mu.   Trnh cho con b bnh, nm v gi? ho?c cho ?n b? sung b?ng n??c ho?c n??c p tri cy thay cho b m?.   ??m b?o b ???c t? vo ng?c v ???c ??t ?ng t? th? trong khi b.   Trnh m?t m?i, c?ng th?ng v thi?u mu.   ?eo o ng?c h? tr?, trnh ki?u c dy kim lo?i.   ?n theo ch? ?? ?n cn b?ng, ?? n??c.  N?u b?n tun theo nh?ng ?? xu?t ny, s? ? mu c?a b?n s? c?i thi?n trong vng 24-48 gi?. N?u b?n v?n g?p ph?i kh kh?n, hy g?i cho chuyn gia t? v?n v? b s?a m? ho?c chuyn gia ch?m sc y t?. CON TI C ???C B ?? S?A KHNG? ?i khi cc b m? lo khng bi?t con mnh c b  ?? s?a khng. Hy yn tm r?ng con b?n ???c b ?? s?a n?u:  B ch? ??ng mt v b?n nghe th?y ti?ng nu?t.   B b t nh?t 8-12 l?n trong kho?ng th?i gian 24 gi?. Cho b b cho ??n khi b t? b? ho?c ng? trong khi b bn ng?c ??u tin (t nh?t 10-20 pht), sau ? cho b b bn th? hai.   B lm ??t 5-6 chi?c t gi?y (6-8 t v?i) trong th?i gian 24 gi? vo lc 5-6 ngy tu?i.   B ?i ngoi t nh?t 2-3 c?c phn m?i 24 gi? trong vi thng ??u tin. S?a m? l t?t c? th?c ph?m b c?n. B?n khng c?n cho b u?ng n??c ho?c s?a cng th?c. Th?c t?, ?? gip b?n c nhi?u s?a h?n, t?t nh?t b?n khng nn cho b ?n b? sung trong nh?ng tu?n ??u.   Phn c?n m?m v c mu vng.   B c?n t?ng 4-7 aox? (112-196 g) m?i tu?n sau khi b ???c 4 ngy tu?i.  T? CH?M SC MNH Ch?m sc ng?c b?n b?ng cch:  T?m hng ngy.   Trnh s? d?ng x phng trn nm v.   B?t ??u cho b ? ng?c bn tri trong m?t l?n cho b v ? ng?c bn ph?i trong l?n cho b k? ti?p.   B?n s? nh?n th?y ngu?n s?a t?ng trong 2-5 ngy sau khi   sinh. B?n c th? c?m th?y kh ch?u do ? mu, hi?n t??ng ny lm cho ng?c b?n r?t c?ng v th??ng nh?y c?m ?au. ? mu ?au "nh?t" trong vng 24 ??n 48 gi?. Trong th?i gian ny, hy p kh?n ?m ?m ln ng?c 5-10 pht tr??c khi cho b. Nh? nhng xoa bp v v?t m?t cht s?a ra tr??c khi cho b s? lm m?m ng?c, gip b ng?m d? h?n. ?eo o ng?c ?? cho b v?a kht v th?i kh nm v trong 10-15 pht sau m?i l?n cho b.   Ch? s? d?ng mi?ng ??m o ng?c b?ng s?i bng.   Ch? s? d?ng lanolin (m? lng c?u) tinh khi?t trn nm v sau khi cho b. B?n khng c?n r?a nm v sau khi cho b.  T? ch?m sc mnh b?ng cch:   ?n nh?ng b?a ?n cn b?ng t?t v ?? ?n dinh d??ng nh?.   U?ng s?a, n??c p tri cy v n??c ?? th?a mn c?n kht (kho?ng 8 ly/ngy).   Ngh? ng?i th?t nhi?u.   T?ng canxi trong kh?u ph?n ?n (1200mg/ngy).   Trnh nh?ng ?? ?n m b?n th?y c ?nh h??ng khng t?t ??n em b.  HY THAM V?N V?I CHUYN GIA Y T?  N?U:  B?n c b?t k? cu h?i ho?c kh kh?n no v?i vi?c cho con b.   B?n c?n tr? gip.   B?n b? m?t vng c?ng, ??, ?au trn ng?c, km theo s?t 100,5 F (38,1 C) tr? ln.   Con b?n qu bu?n ng? ?? ?n t?t ho?c kh ng?.   Con b?n lm ??t d??i 6 chi?c t m?i ngy, vo lc 5 ngy tu?i.   Da ho?c lng tr?ng c?a m?t con b?n vng h?n so v?i lc ? b?nh vi?n.   B?n c?m th?y suy nh??c.  Document Released: 06/30/2005 Document Revised: 06/19/2011 [employer] Patient Information 2012 [employer]. 

## 2011-12-04 NOTE — Progress Notes (Signed)
Needs refill on pnv, still breastfeeding, c/o pain with urination- ua obtained, c/o pain in shoulders  Down back .when holds baby along time.

## 2011-12-06 LAB — URINE CULTURE: Colony Count: 30000

## 2012-10-14 ENCOUNTER — Encounter: Payer: Self-pay | Admitting: Family Medicine

## 2012-11-03 ENCOUNTER — Ambulatory Visit: Payer: 59 | Admitting: Family Medicine

## 2012-11-19 ENCOUNTER — Ambulatory Visit (INDEPENDENT_AMBULATORY_CARE_PROVIDER_SITE_OTHER): Payer: 59 | Admitting: Obstetrics & Gynecology

## 2012-11-19 ENCOUNTER — Encounter: Payer: Self-pay | Admitting: Obstetrics & Gynecology

## 2012-11-19 VITALS — BP 117/83 | HR 77 | Temp 96.8°F | Resp 20 | Ht 60.5 in | Wt 112.3 lb

## 2012-11-19 DIAGNOSIS — N939 Abnormal uterine and vaginal bleeding, unspecified: Secondary | ICD-10-CM | POA: Insufficient documentation

## 2012-11-19 DIAGNOSIS — N898 Other specified noninflammatory disorders of vagina: Secondary | ICD-10-CM

## 2012-11-19 NOTE — Patient Instructions (Signed)

## 2012-11-19 NOTE — Progress Notes (Signed)
Patient ID: Sabrina Daniels, female   DOB: 1981-06-29, 32 y.o.   MRN: [id number]  Chief Complaint  Patient presents with  . Referral    [facility]- notes scanned    HPI Sabrina Daniels is a 32 y.o. female.  S/p VBAC 10/2011.  No BCM, nursed for 4 months. Daily light bleeding from 8/13 to 10/2012, Patient's last menstrual period was 10/26/2012. No bleeding since menses.  Normal pap and labs at [facility] 09/2012. Not currently sexually active HPI  Past Medical History  Diagnosis Date  . Preterm labor   . Pregnancy induced hypertension     Past Surgical History  Procedure Laterality Date  . Middle ear surgery    . Cesarean section      History reviewed. No pertinent family history.  Social History History  Substance Use Topics  . Smoking status: Never Smoker   . Smokeless tobacco: Never Used  . Alcohol Use: No    No Known Allergies  Current Outpatient Prescriptions  Medication Sig Dispense Refill  . norethindrone (ORTHO MICRONOR) 0.35 MG tablet Take 1 tablet (0.35 mg total) by mouth daily.  1 Package  11  . Prenat w/o A Vit-FeFum-FePo-FA (CONCEPT OB) 130-92.4-1 MG CAPS Take 1 capsule by mouth every morning.  30 capsule  2   No current facility-administered medications for this visit.    Review of Systems Review of Systems  Constitutional: Negative for fever and fatigue.  Genitourinary: Positive for menstrual problem. Negative for dysuria, vaginal bleeding, vaginal discharge and pelvic pain.    Blood pressure 117/83, pulse 77, temperature 96.8 F (36 C), temperature source Oral, resp. rate 20, height 5' 0.5" (1.537 m), weight 112 lb 4.8 oz (50.939 kg), last menstrual period 10/26/2012, not currently breastfeeding.  Physical Exam Physical Exam  Constitutional: She is oriented to person, place, and time. She appears well-developed. No distress.  Genitourinary: Vagina normal and uterus normal. No vaginal discharge found.  No mass or tenderness  Neurological: She is alert and oriented to person,  place, and time.  Skin: Skin is warm and dry. No pallor.  Psychiatric: She has a normal mood and affect. Her behavior is normal.    Data Reviewed Notes from [facility], pap and H/H  Assessment    DUB following vaginal birth likely anovulatory, which appears resolved     Plan    Menstrual calendar, report menstrual problems, o/w routine care here or at HD        [facility] 11/19/2012, 9:29 AM

## 2012-11-19 NOTE — Progress Notes (Signed)
Pt sent from [facility] due to irregular menses but states it is not a problem now. LMP in April but unsure of what day. Pt was taking OCP's - last taken May 2013- pt did not realize that she had refills. She does not desire new Rx as the pills make her eat too much. She does not desire pregnancy at this time.

## 2013-02-04 ENCOUNTER — Encounter: Payer: Self-pay | Admitting: *Deleted

## 2013-02-12 ENCOUNTER — Inpatient Hospital Stay (HOSPITAL_COMMUNITY): Payer: 59

## 2013-02-12 ENCOUNTER — Encounter (HOSPITAL_COMMUNITY): Payer: Self-pay | Admitting: *Deleted

## 2013-02-12 ENCOUNTER — Inpatient Hospital Stay (HOSPITAL_COMMUNITY)
Admission: AD | Admit: 2013-02-12 | Discharge: 2013-02-12 | Disposition: A | Discharge status: Home / Self Care | Payer: 59 | Source: Ambulatory Visit | Attending: Obstetrics & Gynecology | Admitting: Obstetrics & Gynecology

## 2013-02-12 DIAGNOSIS — O209 Hemorrhage in early pregnancy, unspecified: Secondary | ICD-10-CM | POA: Insufficient documentation

## 2013-02-12 LAB — OB RESULTS CONSOLE GC/CHLAMYDIA
Chlamydia: NEGATIVE
Gonorrhea: NEGATIVE

## 2013-02-12 LAB — CBC
HCT: 35.8 % — ABNORMAL LOW (ref 36.0–46.0)
Hemoglobin: 11.9 g/dL — ABNORMAL LOW (ref 12.0–15.0)
MCH: 22.5 pg — ABNORMAL LOW (ref 26.0–34.0)
MCHC: 33.2 g/dL (ref 30.0–36.0)
RDW: 14.9 % (ref 11.5–15.5)

## 2013-02-12 LAB — URINALYSIS, ROUTINE W REFLEX MICROSCOPIC
Bilirubin Urine: NEGATIVE
Ketones, ur: NEGATIVE mg/dL
Nitrite: NEGATIVE
Protein, ur: NEGATIVE mg/dL
Specific Gravity, Urine: >1.03 — ABNORMAL HIGH (ref 1.005–1.030)
Urobilinogen, UA: 0.2 mg/dL (ref 0.0–1.0)

## 2013-02-12 LAB — WET PREP, GENITAL
Clue Cells Wet Prep HPF POC: NONE SEEN
Trich, Wet Prep: NONE SEEN

## 2013-02-12 NOTE — MAU Provider Note (Signed)
History     CSN: [id number]  Arrival date and time: 02/12/13 1704   None     Chief Complaint  Patient presents with  . Vaginal Bleeding   HPI This is a 32 y.o. female at [redacted]w[redacted]d who presents with c/o vaginal bleeding twice. Did not know she was pregnant. No pain. Was seen in May for lack of menses, but no pregnancy test was done.   Blood type is O+  RN Note: Patient presents to MAU with c/o vaginal bleeding on Thursday and yesterday. No clotting.       OB History   Grav Para Term Preterm Abortions TAB SAB Ect Mult Living   5 3 2 1 1  1   3       Past Medical History  Diagnosis Date  . Preterm labor   . Pregnancy induced hypertension     Past Surgical History  Procedure Laterality Date  . Middle ear surgery    . Cesarean section      History reviewed. No pertinent family history.  History  Substance Use Topics  . Smoking status: Never Smoker   . Smokeless tobacco: Never Used  . Alcohol Use: No    Allergies: No Known Allergies  Prescriptions prior to admission  Medication Sig Dispense Refill  . norethindrone (ORTHO MICRONOR) 0.35 MG tablet Take 1 tablet (0.35 mg total) by mouth daily.  1 Package  11  . Prenat w/o A Vit-FeFum-FePo-FA (CONCEPT OB) 130-92.4-1 MG CAPS Take 1 capsule by mouth every morning.  30 capsule  2    Review of Systems  Constitutional: Negative for fever, chills and malaise/fatigue.  Gastrointestinal: Negative for nausea, vomiting, abdominal pain, diarrhea and constipation.  Genitourinary: Negative for dysuria.  Neurological: Negative for dizziness.   Physical Exam   Blood pressure 121/82, pulse 75, temperature 97.9 F (36.6 C), temperature source Oral, resp. rate 18, height 5' (1.524 m), weight 51.529 kg (113 lb 9.6 oz), last menstrual period 12/29/2012.  Physical Exam  Constitutional: She is oriented to person, place, and time. She appears well-developed and well-nourished. No distress.  Cardiovascular: Normal rate.   Respiratory:  Effort normal.  GI: Soft. She exhibits no distension. There is no tenderness. There is no rebound and no guarding.  Genitourinary: Uterus normal. Vaginal discharge (small to mod dark blood in vault) found.  Uterus 5-6 wk size Nontender  Musculoskeletal: Normal range of motion.  Neurological: She is alert and oriented to person, place, and time.  Skin: Skin is warm and dry.  Psychiatric: She has a normal mood and affect.    MAU Course  Procedures  MDM Results for orders placed during the hospital encounter of 02/12/13 (from the past 24 hour(s))  URINALYSIS, ROUTINE W REFLEX MICROSCOPIC     Status: Abnormal   Collection Time    02/12/13  5:15 PM      Result Value Range   Color, Urine YELLOW  YELLOW   APPearance CLEAR  CLEAR   Specific Gravity, Urine >1.030 (*) 1.005 - 1.030   pH 6.0  5.0 - 8.0   Glucose, UA NEGATIVE  NEGATIVE mg/dL   Hgb urine dipstick NEGATIVE  NEGATIVE   Bilirubin Urine NEGATIVE  NEGATIVE   Ketones, ur NEGATIVE  NEGATIVE mg/dL   Protein, ur NEGATIVE  NEGATIVE mg/dL   Urobilinogen, UA 0.2  0.0 - 1.0 mg/dL   Nitrite NEGATIVE  NEGATIVE   Leukocytes, UA NEGATIVE  NEGATIVE  POCT PREGNANCY, URINE     Status: Abnormal  Collection Time    02/12/13  5:26 PM      Result Value Range   Preg Test, Ur POSITIVE (*) NEGATIVE  CBC     Status: Abnormal   Collection Time    02/12/13  6:20 PM      Result Value Range   WBC 7.2  4.0 - 10.5 K/uL   RBC 5.29 (*) 3.87 - 5.11 MIL/uL   Hemoglobin 11.9 (*) 12.0 - 15.0 g/dL   HCT 16.1 (*) 09.6 - 04.5 %   MCV 67.7 (*) 78.0 - 100.0 fL   MCH 22.5 (*) 26.0 - 34.0 pg   MCHC 33.2  30.0 - 36.0 g/dL   RDW 40.9  81.1 - 91.4 %   Platelets 165  150 - 400 K/uL  HCG, QUANTITATIVE, PREGNANCY     Status: Abnormal   Collection Time    02/12/13  6:20 PM      Result Value Range   hCG, Beta Chain, Quant, [place] [address] (*) <5 mIU/mL  WET PREP, GENITAL     Status: Abnormal   Collection Time    02/12/13  6:45 PM      Result Value Range   Yeast Wet  Prep HPF POC NONE SEEN  NONE SEEN   Trich, Wet Prep NONE SEEN  NONE SEEN   Clue Cells Wet Prep HPF POC NONE SEEN  NONE SEEN   WBC, Wet Prep HPF POC FEW (*) NONE SEEN   US Ob Comp Less 14 Wks  02/12/2013   *RADIOLOGY REPORT*  Clinical Data: 32 year old pregnant female with bleeding and pelvic pain. Estimated gestational age of [redacted] weeks 3 days by LMP.  OBSTETRIC <14 WK [place] AND TRANSVAGINAL OB US  Technique:  Both transabdominal and transvaginal ultrasound examinations were performed for complete evaluation of the gestation as well as the maternal uterus, adnexal regions, and pelvic cul-de-sac.  Transvaginal technique was performed to assess early pregnancy.  Comparison:  None  Intrauterine gestational sac:  Visualized/normal in shape. Yolk sac: Visualized Embryo: Visualized Cardiac Activity: Visualized Heart Rate: 122 bpm  CRL: 5.6  mm  6 w  3 d        [place] EDC: 10/05/2013  Maternal uterus/adnexae: A moderate subchorionic hemorrhage is noted. The ovaries bilaterally are unremarkable. There is no evidence of free fluid or adnexal mass.    IMPRESSION: Single living intrauterine gestation with estimated gestational age of [redacted] weeks 3 days by this ultrasound.  Moderate subchorionic hemorrhage.     Original Report Authenticated By: Harmon Pier, M.D.    Assessment and Plan  A:  SIUP at 6.3 weeks      Moderate subchorionic hemorrhage  P:  Reviewed findings      Start prenatal care      Bleeding precautions.   [facility] 02/12/2013, 6:37 PM

## 2013-02-12 NOTE — MAU Note (Signed)
Pt Reports she had positve HPT. Had some vaginal bleeidng yesterday none today. Pt also c/o feeling tired and N/V.

## 2013-02-12 NOTE — MAU Note (Signed)
Patient presents to MAU with c/o vaginal bleeding on Thursday and yesterday. No clotting.

## 2013-02-14 LAB — GC/CHLAMYDIA PROBE AMP: CT Probe RNA: NEGATIVE

## 2013-03-11 ENCOUNTER — Ambulatory Visit (INDEPENDENT_AMBULATORY_CARE_PROVIDER_SITE_OTHER): Payer: 59 | Admitting: Obstetrics and Gynecology

## 2013-03-11 ENCOUNTER — Encounter: Payer: Self-pay | Admitting: Obstetrics and Gynecology

## 2013-03-11 VITALS — BP 105/69 | Temp 97.8°F | Wt 113.3 lb

## 2013-03-11 DIAGNOSIS — Z3481 Encounter for supervision of other normal pregnancy, first trimester: Secondary | ICD-10-CM

## 2013-03-11 DIAGNOSIS — O34219 Maternal care for unspecified type scar from previous cesarean delivery: Secondary | ICD-10-CM

## 2013-03-11 DIAGNOSIS — Z3480 Encounter for supervision of other normal pregnancy, unspecified trimester: Secondary | ICD-10-CM | POA: Insufficient documentation

## 2013-03-11 LAB — POCT URINALYSIS DIP (DEVICE)
Glucose, UA: NEGATIVE mg/dL
Nitrite: NEGATIVE
Protein, ur: NEGATIVE mg/dL
Specific Gravity, Urine: 1.02 (ref 1.005–1.030)
Urobilinogen, UA: 0.2 mg/dL (ref 0.0–1.0)

## 2013-03-11 MED ORDER — CONCEPT OB 130-92.4-1 MG PO CAPS: 1.0000 | ORAL_CAPSULE | Freq: Every morning | ORAL | Status: DC

## 2013-03-11 MED ORDER — PROMETHAZINE HCL 12.5 MG PO TABS: 12.5000 mg | ORAL_TABLET | Freq: Four times a day (QID) | ORAL | Status: DC | PRN

## 2013-03-11 NOTE — Progress Notes (Signed)
P= 72  . Used Interpreter . Given new patient information. Discussed bmi and appropriate weight gain. C/o spotting x 3-4 days dark red blood , finished 3 days ago- states just saw it when going to bathroom- did not need to wear a pad.

## 2013-03-11 NOTE — Patient Instructions (Signed)
Pregnancy - First Trimester  During sexual intercourse, millions of sperm go into the vagina. Only 1 sperm will penetrate and fertilize the female egg while it is in the Fallopian tube. One week later, the fertilized egg implants into the wall of the uterus. An embryo begins to develop into a baby. At 6 to 8 weeks, the eyes and face are formed and the heartbeat can be seen on ultrasound. At the end of 12 weeks (first trimester), all the baby's organs are formed. Now that you are pregnant, you will want to do everything you can to have a healthy baby. Two of the most important things are to get good prenatal care and follow your caregiver's instructions. Prenatal care is all the medical care you receive before the baby's birth. It is given to prevent, find, and treat problems during the pregnancy and childbirth.  PRENATAL EXAMS  · During prenatal visits, your weight, blood pressure, and urine are checked. This is done to make sure you are healthy and progressing normally during the pregnancy.  · A pregnant woman should gain 25 to 35 pounds during the pregnancy. However, if you are overweight or underweight, your caregiver will advise you regarding your weight.  · Your caregiver will ask and answer questions for you.  · Blood work, cervical cultures, other necessary tests, and a Pap test are done during your prenatal exams. These tests are done to check on your health and the probable health of your baby. Tests are strongly recommended and done for HIV with your permission. This is the virus that causes AIDS. These tests are done because medicines can be given to help prevent your baby from being born with this infection should you have been infected without knowing it. Blood work is also used to find out your blood type, previous infections, and follow your blood levels (hemoglobin).  · Low hemoglobin (anemia) is common during pregnancy. Iron and vitamins are given to help prevent this. Later in the pregnancy, blood  tests for diabetes will be done along with any other tests if any problems develop.  · You may need other tests to make sure you and the baby are doing well.  CHANGES DURING THE FIRST TRIMESTER   Your body goes through many changes during pregnancy. They vary from person to person. Talk to your caregiver about changes you notice and are concerned about. Changes can include:  · Your menstrual period stops.  · The egg and sperm carry the genes that determine what you look like. Genes from you and your partner are forming a baby. The female genes determine whether the baby is a boy or a girl.  · Your body increases in girth and you may feel bloated.  · Feeling sick to your stomach (nauseous) and throwing up (vomiting). If the vomiting is uncontrollable, call your caregiver.  · Your breasts will begin to enlarge and become tender.  · Your nipples may stick out more and become darker.  · The need to urinate more. Painful urination may mean you have a bladder infection.  · Tiring easily.  · Loss of appetite.  · Cravings for certain kinds of food.  · At first, you may gain or lose a couple of pounds.  · You may have changes in your emotions from day to day (excited to be pregnant or concerned something may go wrong with the pregnancy and baby).  · You may have more vivid and strange dreams.  HOME CARE INSTRUCTIONS   ·   It is very important to avoid all smoking, alcohol and non-prescribed drugs during your pregnancy. These affect the formation and growth of the baby. Avoid chemicals while pregnant to ensure the delivery of a healthy infant.  · Start your prenatal visits by the 12th week of pregnancy. They are usually scheduled monthly at first, then more often in the last 2 months before delivery. Keep your caregiver's appointments. Follow your caregiver's instructions regarding medicine use, blood and lab tests, exercise, and diet.  · During pregnancy, you are providing food for you and your baby. Eat regular, well-balanced  meals. Choose foods such as meat, fish, milk and other low fat dairy products, vegetables, fruits, and whole-grain breads and cereals. Your caregiver will tell you of the ideal weight gain.  · You can help morning sickness by keeping soda crackers at the bedside. Eat a couple before arising in the morning. You may want to use the crackers without salt on them.  · Eating 4 to 5 small meals rather than 3 large meals a day also may help the nausea and vomiting.  · Drinking liquids between meals instead of during meals also seems to help nausea and vomiting.  · A physical sexual relationship may be continued throughout pregnancy if there are no other problems. Problems may be early (premature) leaking of amniotic fluid from the membranes, vaginal bleeding, or belly (abdominal) pain.  · Exercise regularly if there are no restrictions. Check with your caregiver or physical therapist if you are unsure of the safety of some of your exercises. Greater weight gain will occur in the last 2 trimesters of pregnancy. Exercising will help:  · Control your weight.  · Keep you in shape.  · Prepare you for labor and delivery.  · Help you lose your pregnancy weight after you deliver your baby.  · Wear a good support or jogging bra for breast tenderness during pregnancy. This may help if worn during sleep too.  · Ask when prenatal classes are available. Begin classes when they are offered.  · Do not use hot tubs, steam rooms, or saunas.  · Wear your seat belt when driving. This protects you and your baby if you are in an accident.  · Avoid raw meat, uncooked cheese, cat litter boxes, and soil used by cats throughout the pregnancy. These carry germs that can cause birth defects in the baby.  · The first trimester is a good time to visit your dentist for your dental health. Getting your teeth cleaned is okay. Use a softer toothbrush and brush gently during pregnancy.  · Ask for help if you have financial, counseling, or nutritional needs  during pregnancy. Your caregiver will be able to offer counseling for these needs as well as refer you for other special needs.  · Do not take any medicines or herbs unless told by your caregiver.  · Inform your caregiver if there is any mental or physical domestic violence.  · Make a list of emergency phone numbers of family, friends, hospital, and police and fire departments.  · Write down your questions. Take them to your prenatal visit.  · Do not douche.  · Do not cross your legs.  · If you have to stand for long periods of time, rotate you feet or take small steps in a circle.  · You may have more vaginal secretions that may require a sanitary pad. Do not use tampons or scented sanitary pads.  MEDICINES AND DRUG USE IN PREGNANCY  ·   Take prenatal vitamins as directed. The vitamin should contain 1 milligram of folic acid. Keep all vitamins out of reach of children. Only a couple vitamins or tablets containing iron may be fatal to a baby or young child when ingested.  · Avoid use of all medicines, including herbs, over-the-counter medicines, not prescribed or suggested by your caregiver. Only take over-the-counter or prescription medicines for pain, discomfort, or fever as directed by your caregiver. Do not use aspirin, ibuprofen, or naproxen unless directed by your caregiver.  · Let your caregiver also know about herbs you may be using.  · Alcohol is related to a number of birth defects. This includes fetal alcohol syndrome. All alcohol, in any form, should be avoided completely. Smoking will cause low birth rate and premature babies.  · Street or illegal drugs are very harmful to the baby. They are absolutely forbidden. A baby born to an addicted mother will be addicted at birth. The baby will go through the same withdrawal an adult does.  · Let your caregiver know about any medicines that you have to take and for what reason you take them.  SEEK MEDICAL CARE IF:   You have any concerns or worries during your  pregnancy. It is better to call with your questions if you feel they cannot wait, rather than worry about them.  SEEK IMMEDIATE MEDICAL CARE IF:   · An unexplained oral temperature above 102° F (38.9° C) develops, or as your caregiver suggests.  · You have leaking of fluid from the vagina (birth canal). If leaking membranes are suspected, take your temperature and inform your caregiver of this when you call.  · There is vaginal spotting or bleeding. Notify your caregiver of the amount and how many pads are used.  · You develop a bad smelling vaginal discharge with a change in the color.  · You continue to feel sick to your stomach (nauseated) and have no relief from remedies suggested. You vomit blood or coffee ground-like materials.  · You lose more than 2 pounds of weight in 1 week.  · You gain more than 2 pounds of weight in 1 week and you notice swelling of your face, hands, feet, or legs.  · You gain 5 pounds or more in 1 week (even if you do not have swelling of your hands, face, legs, or feet).  · You get exposed to German measles and have never had them.  · You are exposed to fifth disease or chickenpox.  · You develop belly (abdominal) pain. Round ligament discomfort is a common non-cancerous (benign) cause of abdominal pain in pregnancy. Your caregiver still must evaluate this.  · You develop headache, fever, diarrhea, pain with urination, or shortness of breath.  · You fall or are in a car accident or have any kind of trauma.  · There is mental or physical violence in your home.  Document Released: 06/24/2001 Document Revised: 03/24/2012 Document Reviewed: 12/26/2008  [employer]® Patient Information ©2014 [employer].

## 2013-03-11 NOTE — Progress Notes (Signed)
   Subjective:    Makenly Pernice is a [id number] [redacted]w[redacted]d being seen today for her first obstetrical visit.  Her obstetrical history is significant for previous C/S and VBAC.Marland Kitchen Patient does intend to breast feed. Pregnancy history fully reviewed.  Patient reports nausea. Occasionally vomits. Seen MAU 1st tri spotting.   Filed Vitals:   03/11/13 0821  BP: 105/69  Temp: 97.8 F (36.6 C)  Weight: 113 lb 4.8 oz (51.393 kg)    HISTORY: OB History  Gravida Para Term Preterm AB SAB TAB Ectopic Multiple Living  5 3 2 1 1 1    3     # Outcome Date GA Lbr Len/2nd Weight Sex Delivery Anes PTL Lv  5 CUR           4 TRM 10/31/11 [redacted]w[redacted]d / 00:15 8 lb 5.7 oz (3.79 kg) F SVD None  Y  3 PRE 02/26/08 [redacted]w[redacted]d  2 lb 15 oz (1.332 kg) F CS Gen Y      Comments: NO PNC, HELLP Syndrome, Pre-eclampsia, PTL  2 SAB 2009          1 TRM 07/16/06 [redacted]w[redacted]d  7 lb (3.175 kg) M SVD None  Y     Past Medical History  Diagnosis Date  . Preterm labor   . Pregnancy induced hypertension    Past Surgical History  Procedure Laterality Date  . Middle ear surgery    . Cesarean section     Family History  Problem Relation Age of Onset  . Stroke Father      Exam    Uterus:     Pelvic Exam:    Perineum: No Hemorrhoids, Normal Perineum   Vulva: normal, Bartholin's, Urethra, Skene's normal   Vagina:  normal mucosa, normal discharge       Cervix: ectropion, no bleeding   Adnexa: no mass, fullness, tenderness   Bony Pelvis: gynecoid  System: Breast:  normal appearance, no masses or tenderness, Inspection negative, No nipple retraction or dimpling   Skin: normal coloration and turgor, no rashes    Neurologic: oriented, normal, grossly non-focal   Extremities: normal strength, tone, and muscle mass   HEENT PERRLA and extra ocular movement intact   Mouth/Teeth mucous membranes moist, pharynx normal without lesions and dental hygiene good   Neck supple and thyroid not enlarged   Cardiovascular: regular rate and rhythm   Respiratory:  appears well, vitals normal, no respiratory distress, acyanotic, normal RR, ear and throat exam is normal, neck free of mass or lymphadenopathy, chest clear, no wheezing, crepitations, rhonchi, normal symmetric air entry   Abdomen: S=D. FHR DT 170   Urinary: urethral meatus normal      Assessment:    Pregnancy: [id number] @ [redacted]w[redacted]d Language barrier Hx C/S and VBAC>desires TOLAC N/V of pregnancy Hgb E trait 1st trimester spotting - resolved       Plan:     Initial labs drawn. Prenatal vitamins and Phenergan Problem list reviewed and updated. Genetic Screening discussed First Screen: declined.  Ultrasound discussed; fetal survey: requested.  Follow up in 4 weeks. 50% of 30 min visit spent on counseling and coordination of care.  Interpreter present ([place] dialect)   Jory Welke 03/11/2013

## 2013-03-11 NOTE — Addendum Note (Signed)
Addended by: Franchot Mimes on: 03/11/2013 09:11 AM   Modules accepted: Orders

## 2013-03-11 NOTE — Addendum Note (Signed)
Addended by: Gerome Apley on: 03/11/2013 08:58 AM   Modules accepted: Orders

## 2013-03-12 LAB — OBSTETRIC PANEL
Antibody Screen: NEGATIVE
Basophils Relative: 0 % (ref 0–1)
Eosinophils Absolute: 0 10*3/uL (ref 0.0–0.7)
HCT: 36.9 % (ref 36.0–46.0)
MCH: 22.7 pg — ABNORMAL LOW (ref 26.0–34.0)
MCHC: 32.5 g/dL (ref 30.0–36.0)
Monocytes Absolute: 0.3 10*3/uL (ref 0.1–1.0)
Monocytes Relative: 5 % (ref 3–12)
Neutro Abs: 5.2 10*3/uL (ref 1.7–7.7)
RBC: 5.29 MIL/uL — ABNORMAL HIGH (ref 3.87–5.11)
Rh Type: POSITIVE
WBC: 6.9 10*3/uL (ref 4.0–10.5)

## 2013-03-13 LAB — CULTURE, OB URINE

## 2013-04-06 ENCOUNTER — Ambulatory Visit (INDEPENDENT_AMBULATORY_CARE_PROVIDER_SITE_OTHER): Payer: 59 | Admitting: Advanced Practice Midwife

## 2013-04-06 VITALS — BP 108/73 | Temp 98.0°F | Wt 114.0 lb

## 2013-04-06 DIAGNOSIS — Z3480 Encounter for supervision of other normal pregnancy, unspecified trimester: Secondary | ICD-10-CM

## 2013-04-06 DIAGNOSIS — Z3482 Encounter for supervision of other normal pregnancy, second trimester: Secondary | ICD-10-CM

## 2013-04-06 DIAGNOSIS — Z3481 Encounter for supervision of other normal pregnancy, first trimester: Secondary | ICD-10-CM

## 2013-04-06 DIAGNOSIS — O34219 Maternal care for unspecified type scar from previous cesarean delivery: Secondary | ICD-10-CM

## 2013-04-06 LAB — POCT URINALYSIS DIP (DEVICE)
Bilirubin Urine: NEGATIVE
Hgb urine dipstick: NEGATIVE
Nitrite: NEGATIVE
Specific Gravity, Urine: 1.02 (ref 1.005–1.030)
pH: 7.5 (ref 5.0–8.0)

## 2013-04-06 MED ORDER — PROMETHAZINE HCL 12.5 MG PO TABS: 12.5000 mg | ORAL_TABLET | Freq: Four times a day (QID) | ORAL | Status: DC | PRN

## 2013-04-06 NOTE — Progress Notes (Signed)
Doing well.  Denies vaginal bleeding, LOF, cramping/contractions. Reports some nausea and vomiting x1-2 daily.  Desires refill of phenergan, sent to pt pharmacy.

## 2013-04-06 NOTE — Progress Notes (Signed)
P=74,    Used Equ[employer]. Wants to change PNV prescription

## 2013-04-08 ENCOUNTER — Encounter: Payer: Self-pay | Admitting: *Deleted

## 2013-04-08 DIAGNOSIS — Z98891 History of uterine scar from previous surgery: Secondary | ICD-10-CM | POA: Insufficient documentation

## 2013-05-04 ENCOUNTER — Ambulatory Visit (INDEPENDENT_AMBULATORY_CARE_PROVIDER_SITE_OTHER): Payer: 59 | Admitting: Advanced Practice Midwife

## 2013-05-04 ENCOUNTER — Encounter: Payer: Self-pay | Admitting: *Deleted

## 2013-05-04 VITALS — BP 112/75 | Temp 96.5°F | Wt 117.0 lb

## 2013-05-04 DIAGNOSIS — O34219 Maternal care for unspecified type scar from previous cesarean delivery: Secondary | ICD-10-CM

## 2013-05-04 DIAGNOSIS — Z23 Encounter for immunization: Secondary | ICD-10-CM

## 2013-05-04 DIAGNOSIS — Z3480 Encounter for supervision of other normal pregnancy, unspecified trimester: Secondary | ICD-10-CM

## 2013-05-04 LAB — POCT URINALYSIS DIP (DEVICE)
Hgb urine dipstick: NEGATIVE
Ketones, ur: NEGATIVE mg/dL
Leukocytes, UA: NEGATIVE
Protein, ur: NEGATIVE mg/dL
Specific Gravity, Urine: 1.02 (ref 1.005–1.030)
pH: 7 (ref 5.0–8.0)

## 2013-05-04 MED ORDER — SE-NATAL 19 29-1 MG PO TABS: 1.0000 | ORAL_TABLET | Freq: Every day | ORAL | Status: DC

## 2013-05-04 NOTE — Progress Notes (Signed)
Doing well.  Denies vaginal bleeding, LOF, regular contractions. Pt wants to change prenatal vitamin to another brand that is easier to swallow.  Pt brought sample of brand she desires.  SE-Natal 19-29-1 sent to pharmacy.

## 2013-05-04 NOTE — Progress Notes (Signed)
P-73 

## 2013-05-18 ENCOUNTER — Ambulatory Visit (HOSPITAL_COMMUNITY)
Admission: RE | Admit: 2013-05-18 | Discharge: 2013-05-18 | Disposition: A | Discharge status: Home / Self Care | Payer: Medicaid Other | Source: Ambulatory Visit | Attending: Advanced Practice Midwife | Admitting: Advanced Practice Midwife

## 2013-05-18 ENCOUNTER — Other Ambulatory Visit: Payer: Self-pay | Admitting: Advanced Practice Midwife

## 2013-05-18 DIAGNOSIS — O34219 Maternal care for unspecified type scar from previous cesarean delivery: Secondary | ICD-10-CM | POA: Insufficient documentation

## 2013-05-18 DIAGNOSIS — O09299 Supervision of pregnancy with other poor reproductive or obstetric history, unspecified trimester: Secondary | ICD-10-CM | POA: Insufficient documentation

## 2013-05-18 DIAGNOSIS — O358XX0 Maternal care for other (suspected) fetal abnormality and damage, not applicable or unspecified: Secondary | ICD-10-CM | POA: Insufficient documentation

## 2013-05-18 DIAGNOSIS — Z3480 Encounter for supervision of other normal pregnancy, unspecified trimester: Secondary | ICD-10-CM

## 2013-05-18 DIAGNOSIS — Z363 Encounter for antenatal screening for malformations: Secondary | ICD-10-CM | POA: Insufficient documentation

## 2013-05-18 DIAGNOSIS — Z1389 Encounter for screening for other disorder: Secondary | ICD-10-CM | POA: Insufficient documentation

## 2013-05-30 ENCOUNTER — Inpatient Hospital Stay (HOSPITAL_COMMUNITY)
Admission: AD | Admit: 2013-05-30 | Discharge: 2013-05-30 | Disposition: A | Discharge status: Home / Self Care | Payer: No Typology Code available for payment source | Source: Ambulatory Visit | Attending: Family Medicine | Admitting: Family Medicine

## 2013-05-30 ENCOUNTER — Encounter (HOSPITAL_COMMUNITY): Payer: Self-pay

## 2013-05-30 DIAGNOSIS — O99891 Other specified diseases and conditions complicating pregnancy: Secondary | ICD-10-CM | POA: Insufficient documentation

## 2013-05-30 DIAGNOSIS — O34219 Maternal care for unspecified type scar from previous cesarean delivery: Secondary | ICD-10-CM

## 2013-05-30 DIAGNOSIS — Y92410 Unspecified street and highway as the place of occurrence of the external cause: Secondary | ICD-10-CM | POA: Insufficient documentation
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
## 2013-06-01 ENCOUNTER — Ambulatory Visit (INDEPENDENT_AMBULATORY_CARE_PROVIDER_SITE_OTHER): Payer: 59 | Admitting: Advanced Practice Midwife

## 2013-06-01 ENCOUNTER — Other Ambulatory Visit: Payer: Self-pay | Admitting: Advanced Practice Midwife

## 2013-06-01 ENCOUNTER — Encounter: Payer: Self-pay | Admitting: Advanced Practice Midwife

## 2013-06-01 VITALS — BP 102/69 | Temp 96.7°F | Wt 118.3 lb

## 2013-06-01 DIAGNOSIS — D573 Sickle-cell trait: Secondary | ICD-10-CM

## 2013-06-01 DIAGNOSIS — Z3482 Encounter for supervision of other normal pregnancy, second trimester: Secondary | ICD-10-CM

## 2013-06-01 DIAGNOSIS — O34219 Maternal care for unspecified type scar from previous cesarean delivery: Secondary | ICD-10-CM

## 2013-06-01 LAB — POCT URINALYSIS DIP (DEVICE)
Hgb urine dipstick: NEGATIVE
Ketones, ur: NEGATIVE mg/dL
Protein, ur: NEGATIVE mg/dL
Specific Gravity, Urine: 1.025 (ref 1.005–1.030)
Urobilinogen, UA: 1 mg/dL (ref 0.0–1.0)
pH: 7 (ref 5.0–8.0)
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
## 2013-06-01 NOTE — Patient Instructions (Signed)
Cervical Strain and Sprain (Whiplash) with Rehab Cervical strain and sprains are injuries that commonly occur with "whiplash" injuries. Whiplash occurs when the neck is forcefully whipped backward or forward, such as during a motor vehicle accident. The muscles, ligaments, tendons, discs and nerves of the neck are susceptible to injury when this occurs. SYMPTOMS   Pain or stiffness in the front and/or back of neck  Symptoms may present immediately or up to 24 hours after injury.  Dizziness, headache, nausea and vomiting.  Muscle spasm with soreness and stiffness in the neck.  Tenderness and swelling at the injury site. CAUSES  Whiplash injuries often occur during contact sports or motor vehicle accidents.  RISK INCREASES WITH:  Osteoarthritis of the spine.  Situations that make head or neck accidents or trauma more likely.  High-risk sports (football, rugby, wrestling, hockey, auto racing, gymnastics, diving, contact karate or boxing).  Poor strength and flexibility of the neck.  Previous neck injury.  Poor tackling technique.  Improperly fitted or padded equipment. PREVENTION  Learn and use proper technique (avoid tackling with the head, spearing and head-butting; use proper falling techniques to avoid landing on the head).  Warm up and stretch properly before activity.  Maintain physical fitness:  Strength, flexibility and endurance.  Cardiovascular fitness.  Wear properly fitted and padded protective equipment, such as padded soft collars, for participation in contact sports. PROGNOSIS  Recovery for cervical strain and sprain injuries is dependent on the extent of the injury. These injuries are usually curable in 1 week to 3 months with appropriate treatment.  RELATED COMPLICATIONS   Temporary numbness and weakness may occur if the nerve roots are damaged, and this may persist until the nerve has completely healed.  Chronic pain due to frequent recurrence of  symptoms.  Prolonged healing, especially if activity is resumed too soon (before complete recovery). TREATMENT  Treatment initially involves the use of ice and medication to help reduce pain and inflammation. It is also important to perform strengthening and stretching exercises and modify activities that worsen symptoms so the injury does not get worse. These exercises may be performed at home or with a therapist. For patients who experience severe symptoms, a soft padded collar may be recommended to be worn around the neck.  Improving your posture may help reduce symptoms. Posture improvement includes pulling your chin and abdomen in while sitting or standing. If you are sitting, sit in a firm chair with your buttocks against the back of the chair. While sleeping, try replacing your pillow with a small towel rolled to 2 inches in diameter, or use a cervical pillow or soft cervical collar. Poor sleeping positions delay healing.  For patients with nerve root damage, which causes numbness or weakness, the use of a cervical traction apparatus may be recommended. Surgery is rarely necessary for these injuries. However, cervical strain and sprains that are present at birth (congenital) may require surgery. MEDICATION   If pain medication is necessary, nonsteroidal anti-inflammatory medications, such as aspirin and ibuprofen, or other minor pain relievers, such as acetaminophen, are often recommended.  Do not take pain medication for 7 days before surgery.  Prescription pain relievers may be given if deemed necessary by your caregiver. Use only as directed and only as much as you need. HEAT AND COLD:   Cold treatment (icing) relieves pain and reduces inflammation. Cold treatment should be applied for 10 to 15 minutes every 2 to 3 hours for inflammation and pain and immediately after any activity that  need.  HEAT AND COLD:   · Cold treatment (icing) relieves pain and reduces inflammation. Cold treatment should be applied for 10 to 15 minutes every 2 to 3 hours for inflammation and pain and immediately after any activity that aggravates your symptoms. Use ice packs or an ice massage.  · Heat treatment may be used prior to  performing the stretching and strengthening activities prescribed by your caregiver, physical therapist, or athletic trainer. Use a heat pack or a warm soak.  SEEK MEDICAL CARE IF:   · Symptoms get worse or do not improve in 2 weeks despite treatment.  · New, unexplained symptoms develop (drugs used in treatment may produce side effects).  EXERCISES  RANGE OF MOTION (ROM) AND STRETCHING EXERCISES - Cervical Strain and Sprain  These exercises may help you when beginning to rehabilitate your injury. In order to successfully resolve your symptoms, you must improve your posture. These exercises are designed to help reduce the forward-head and rounded-shoulder posture which contributes to this condition. Your symptoms may resolve with or without further involvement from your physician, physical therapist or athletic trainer. While completing these exercises, remember:   · Restoring tissue flexibility helps normal motion to return to the joints. This allows healthier, less painful movement and activity.  · An effective stretch should be held for at least 20 seconds, although you may need to begin with shorter hold times for comfort.  · A stretch should never be painful. You should only feel a gentle lengthening or release in the stretched tissue.  STRETCH- Axial Extensors  · Lie on your back on the floor. You may bend your knees for comfort. Place a rolled up hand towel or dish towel, about 2 inches in diameter, under the part of your head that makes contact with the floor.  · Gently tuck your chin, as if trying to make a "double chin," until you feel a gentle stretch at the base of your head.  · Hold __________ seconds.  Repeat __________ times. Complete this exercise __________ times per day.   STRETECH - Axial Extension   · Stand or sit on a firm surface. Assume a good posture: chest up, shoulders drawn back, abdominal muscles slightly tense, knees unlocked (if standing) and feet hip width apart.  · Slowly retract your  chin so your head slides back and your chin slightly lowers.Continue to look straight ahead.  · You should feel a gentle stretch in the back of your head. Be certain not to feel an aggressive stretch since this can cause headaches later.  · Hold for __________ seconds.  Repeat __________ times. Complete this exercise __________ times per day.  STRETCH  Cervical Side Bend   · Stand or sit on a firm surface. Assume a good posture: chest up, shoulders drawn back, abdominal muscles slightly tense, knees unlocked (if standing) and feet hip width apart.  · Without letting your nose or shoulders move, slowly tip your right / left ear to your shoulder until your feel a gentle stretch in the muscles on the opposite side of your neck.  · Hold __________ seconds.  Repeat __________ times. Complete this exercise __________ times per day.  STRETCH  Cervical Rotators   · Stand or sit on a firm surface. Assume a good posture: chest up, shoulders drawn back, abdominal muscles slightly tense, knees unlocked (if standing) and feet hip width apart.  · Keeping your eyes level with the ground, slowly turn your head until you feel a gentle   stretch along the back and opposite side of your neck.  · Hold __________ seconds.  Repeat __________ times. Complete this exercise __________ times per day.  RANGE OF MOTION - Neck Circles   · Stand or sit on a firm surface. Assume a good posture: chest up, shoulders drawn back, abdominal muscles slightly tense, knees unlocked (if standing) and feet hip width apart.  · Gently roll your head down and around from the back of one shoulder to the back of the other. The motion should never be forced or painful.  · Repeat the motion 10-20 times, or until you feel the neck muscles relax and loosen.  Repeat __________ times. Complete the exercise __________ times per day.  STRENGTHENING EXERCISES - Cervical Strain and Sprain  These exercises may help you when beginning to rehabilitate your injury. They may  resolve your symptoms with or without further involvement from your physician, physical therapist or athletic trainer. While completing these exercises, remember:   · Muscles can gain both the endurance and the strength needed for everyday activities through controlled exercises.  · Complete these exercises as instructed by your physician, physical therapist or athletic trainer. Progress the resistance and repetitions only as guided.  · You may experience muscle soreness or fatigue, but the pain or discomfort you are trying to eliminate should never worsen during these exercises. If this pain does worsen, stop and make certain you are following the directions exactly. If the pain is still present after adjustments, discontinue the exercise until you can discuss the trouble with your clinician.  STRENGTH Cervical Flexors, Isometric  · Face a wall, standing about 6 inches away. Place a small pillow, a ball about 6-8 inches in diameter, or a folded towel between your forehead and the wall.  · Slightly tuck your chin and gently push your forehead into the soft object. Push only with mild to moderate intensity, building up tension gradually. Keep your jaw and forehead relaxed.  · Hold 10 to 20 seconds. Keep your breathing relaxed.  · Release the tension slowly. Relax your neck muscles completely before you start the next repetition.  Repeat __________ times. Complete this exercise __________ times per day.  STRENGTH- Cervical Lateral Flexors, Isometric   · Stand about 6 inches away from a wall. Place a small pillow, a ball about 6-8 inches in diameter, or a folded towel between the side of your head and the wall.  · Slightly tuck your chin and gently tilt your head into the soft object. Push only with mild to moderate intensity, building up tension gradually. Keep your jaw and forehead relaxed.  · Hold 10 to 20 seconds. Keep your breathing relaxed.  · Release the tension slowly. Relax your neck muscles completely before  you start the next repetition.  Repeat __________ times. Complete this exercise __________ times per day.  STRENGTH  Cervical Extensors, Isometric   · Stand about 6 inches away from a wall. Place a small pillow, a ball about 6-8 inches in diameter, or a folded towel between the back of your head and the wall.  · Slightly tuck your chin and gently tilt your head back into the soft object. Push only with mild to moderate intensity, building up tension gradually. Keep your jaw and forehead relaxed.  · Hold 10 to 20 seconds. Keep your breathing relaxed.  · Release the tension slowly. Relax your neck muscles completely before you start the next repetition.  Repeat __________ times. Complete this exercise __________ times per   work. All of your joints have less wear and tear when properly supported by a spine with good posture. This means you will experience a healthier, less painful body.  Correct posture must be practiced with all of your activities, especially prolonged sitting and standing. Correct posture is as important when doing repetitive low-stress activities (typing) as it is when doing a single heavy-load activity (lifting). PROLONGED STANDING WHILE SLIGHTLY LEANING FORWARD When completing a task that requires you to lean forward while  standing in one place for a long time, place either foot up on a stationary 2-4 inch high object to help maintain the best posture. When both feet are on the ground, the low back tends to lose its slight inward curve. If this curve flattens (or becomes too large), then the back and your other joints will experience too much stress, fatigue more quickly and can cause pain.  RESTING POSITIONS Consider which positions are most painful for you when choosing a resting position. If you have pain with flexion-based activities (sitting, bending, stooping, squatting), choose a position that allows you to rest in a less flexed posture. You would want to avoid curling into a fetal position on your side. If your pain worsens with extension-based activities (prolonged standing, working overhead), avoid resting in an extended position such as sleeping on your stomach. Most people will find more comfort when they rest with their spine in a more neutral position, neither too rounded nor too arched. Lying on a non-sagging bed on your side with a pillow between your knees, or on your back with a pillow under your knees will often provide some relief. Keep in mind, being in any one position for a prolonged period of time, no matter how correct your posture, can still lead to stiffness. WALKING Walk with an upright posture. Your ears, shoulders and hips should all line-up. OFFICE WORK When working at a desk, create an environment that supports good, upright posture. Without extra support, muscles fatigue and lead to excessive strain on joints and other tissues. CHAIR:  A chair should be able to slide under your desk when your back makes contact with the back of the chair. This allows you to work closely.  The chair's height should allow your eyes to be level with the upper part of your monitor and your hands to be slightly lower than your elbows.  Body position:  Your feet should make contact with the floor. If this is  not possible, use a foot rest.  Keep your ears over your shoulders. This will reduce stress on your neck and low back. Document Released: 06/30/2005 Document Revised: 10/25/2012 Document Reviewed: 10/12/2008 [facility] Patient Information 2014 [place], [place]. Tr?o C? (Cervical Sprain) Tr?o c? l m?t ch?n th??ng ? c?, trong ? cc dy ch?ng b? ko dn ho?c rch. Dy ch?ng l cc m gi? x??ng c? (??t s?ng) t?i ch?. Tr?o c? c th? c m?c ?? t? r?t nh? ??n r?t n?ng. Tr?o c? h?u h?t s? kh ln sau 1 ??n 3 tu?n, nh?ng n ph? thu?c vo nguyn nhn v m?c ?? ch?n th??ng. Tr?o c? nghim tr?ng c th? khi?n cho ??t s?ng c? khng ?n ??nh. ?i?u ny c th? d?n ??n t?n th??ng t?y s?ng v c th? gy ra v?n ?? nghim tr?ng ??i v?i h? th?n kinh. Chuyn gia ch?m [place] s?c kh?e s? xc ??nh xem b?n b? tr?o c? m?c ?? nh? hay n?ng.  NGUYN NHN Tr?o c? n?ng c th? do:  Ch?n  th??ng trong mn th? thao ti?p xc (bng ?, bng b?u d?c, v?t, khc cn c?u, ?ua  t, th? d?c, l?n, v thu?t, quy?n Anh).  Va ch?m xe c? gi?i.  Ch?n th??ng do t?ng ho?c gi?m t?c ?? ??t ng?t. ?i?u ny c ngh?a l c? b? ??p m?nh v? pha sau v v? pha tr??c.  T ng. Tr?o c? m?c ?? nh? c th? do:  T? th? v?ng v?, ch?ng h?n nh? k?p ?i?n tho?i gi?a tai v vai c?a b?n.  Ng?i gh? khng c h? tr? thch h?p.  Lm vi?c t?i m?t tr?m my tnh ???c thi?t k? km.  Ho?t ??ng ?i h?i ph?i nhn ln ho?c nhn xu?ng trong th?i gian di. TRI?U CH?NG  ?au, ?au nh?c, c?ng ho?c c?m gic b?ng rt ? pha tr??c, sau ho?c cc bn c?. C?m gic kh ch?u ny c th? c ngay l?p t?c sau ch?n th??ng ho?c n c th? c ch?m v khng b?t ??u trong 24 gi? ho?c lu h?n sau ch?n th??ng.  ?au ho?c nh?y c?m ?au ? vng gi?a gy.  ?au vai ho?c l?ng trn.  Kh? n?ng c? ??ng c? b? h?n ch?.  ?au ??u.  Hoa m?t.  Y?u, t ho?c ?au bu?t ? bn tay ho?c cnh tay.  Co th?t c?.  Kh nu?t ho?c kh nhai.  Nh?y c?m ?au v s?ng ? c?. CH?N ?ON Chuyn gia ch?m [place] s?c kh?e ph?n  l?n c th? ch?n ?on v?n ?? ny b?ng cch xem b?nh s? c?a b?n v khm th?c th?Shaune Pascal gia ch?m [place] s?c kh?e s? h?i v? b?t k? v?n ?? no ? bi?t, ch?ng h?n nh? vim kh?p ? c? ho?c ch?n th??ng c? tr??c ?. C th? ch?p X-quang ?? xc ??nh xem c b?t k? v?n ?? no khc khng, ch?ng h?n nh? cc v?n ?? v?i x??ng c?. Tuy nhin, k?t qu? ch?p [place] th??ng khng th? hi?n m?c ?? ??y ?? c?a tr?o c?. C th? c?n cc xt nghi?m khc, ch?ng h?n nh? ch?p c?t l?p vi tnh (CT) ho?c ch?p c?ng h??ng t? (MRI). ?I?U TR? ?i?u tr? ph? thu?c vo m?c ?? nghim tr?ng c?a tr?o c?. Tr?o nh? c th? ???c ?i?u tr? b?ng vi?c ngh? ng?i, gi? c? ? nguyn v? tr (c? ??nh) v thu?c gi?m ?au. Tr?o c? n?ng c?n c? ??nh ngay l?p t?c v g?p bc s? ch?nh hnh ho?c bc s? gi?i ph?u th?n kinh. M?t s? l?a ch?n ?i?u tr? c s?n ?? gip gi?m ?au, co th?t c? v cc tri?u ch?ng khc. Chuyn gia ch?m [place] s?c kh?e c th? ch? ??nh:  Thu?c, ch?ng h?n nh? thu?c gi?m ?au, thu?c t ho?c thu?c gin c?.  V?t l tr? li?u. Bi?n php ny c th? bao g?m cc bi t?p ko dn, bi t?p t?ng c??ng s?c b?n v t?p t? th?. Bi t?p v c?i thi?n t? th? c th? gip ?n ??nh c?, t?ng c??ng c? b?p v gip ng?n ch?n cc tri?u ch?ng quay tr? l?i.  D?ng c? ?? c? ???c ?eo trong th?i gian ng?n. D?ng c? ?? ny th??ng ???c ?eo ?? t?o c?m gic tho?i mi. Tuy nhin, m?t s? d?ng c? ?? c? nh?t ??nh c th? ???c ?eo ?? b?o v? c? v ng?n ch?n tr?o c? nghim tr?ng ti?n tri?n x?u ?i. H??NG D?N CH?M  T?I NH  Ch??m ? l?nh ln vng b? th??ng.  Cho ? l?nh vo ti nh?a.  ??t kh?n t?m gi?a da v ti.  Ch??m ?  l?nh trong 15 ??n 20 pht, 3 ??n 4 l?n m?i ngy.  Ch? s? d?ng thu?c khng c?n k toa ho?c thu?c c?n k toa ?? gi?m ?au, gi?m c?m gic kh ch?u ho?c h? s?t theo ch? d?n c?a chuyn gia ch?m [place] s?c kh?e c?a b?n.  Tun th? m?i cu?c h?n khm l?i theo ch? d?n c?a chuyn gia ch?m [place] s?c kh?e.  Tun th? m?i cu?c h?n lm v?t l tr? li?u theo ch? d?n c?a chuyn gia ch?m [place] s?c  kh?e.  N?u d?ng c? ?? c? ???c k ??n, hy s? d?ng n theo ch? d?n c?a chuyn gia ch?m [place] s?c kh?e.  Khng li xe trong khi ?eo d?ng c? ?? c?.  Th?c hi?n m?i ?i?u ch?nh c?n thi?t ? n?i lm vi?c c?a b?n ?? c t? th? t?t.  Trnh nh?ng t? th? v ho?t ??ng lm cho cc tri?u ch?ng c?a b?n t?i t? h?n.  Kh?i ??ng v ko dn tr??c khi ho?t ??ng ?? gip phng ng?a v?n ??. HY ?I KHM N?U:  C?n ?au c?a b?n khng ki?m sot ???c b?ng thu?c.  B?n khng th? gi?m thu?c gi?m ?au theo th?i gian trong k? ho?ch.  M?c ?? ho?t ??ng c?a b?n khng c?i thi?n nh? mong ??i. HY NGAY L?P T?C ?I KHM N?U:  B?n b? b?t c? tnh tr?ng ch?y mu, ?au b?ng ho?c d?u hi?u ph?n ?ng d? ?ng no v?i thu?c.  Tri?u ch?ng c?a b?n n?ng h?n.  B?n c nh?ng tri?u ch?ng m?i khng r nguyn nhn.  B?n b? t, ?au bu?t, y?u ho?c li?t b?t c? ph?n no c?a c? th?. ??M B?O B?N:  Hi?u cc h??ng d?n ny.  S? theo di tnh tr?ng c?a mnh.  S? yu c?u tr? gip ngay l?p t?c n?u b?n c?m th?y khng ?? ho?c tnh tr?ng tr?m tr?ng h?n. Document Released: 06/19/2011 Document Revised: 03/02/2013 [facility] Patient Information 2014 [place], [place].
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
## 2013-06-02 ENCOUNTER — Encounter: Payer: Self-pay | Admitting: Family Medicine

## 2013-06-02 ENCOUNTER — Ambulatory Visit (INDEPENDENT_AMBULATORY_CARE_PROVIDER_SITE_OTHER): Payer: 59 | Admitting: Family Medicine

## 2013-06-02 VITALS — BP 110/78 | HR 90 | Wt 120.0 lb

## 2013-06-02 DIAGNOSIS — S139XXA Sprain of joints and ligaments of unspecified parts of neck, initial encounter: Secondary | ICD-10-CM

## 2013-06-02 DIAGNOSIS — S6992XA Unspecified injury of left wrist, hand and finger(s), initial encounter: Secondary | ICD-10-CM | POA: Insufficient documentation

## 2013-06-02 DIAGNOSIS — S134XXA Sprain of ligaments of cervical spine, initial encounter: Secondary | ICD-10-CM

## 2013-06-02 DIAGNOSIS — S59909A Unspecified injury of unspecified elbow, initial encounter: Secondary | ICD-10-CM

## 2013-06-02 NOTE — Assessment & Plan Note (Signed)
It is hard to tell. Patient's left wrist does have full range of motion in appears to have good grip strength. Patient states that she is having numbness in all fingertips which does not correlate well with any neurological symptoms. In thinking that this is likely more of a sprain and may have caused more of a neuropraxia. This likely will get better in time. Patient was given range of motion exercises as well as a brace which was fitted by me today. Patient will try these interventions and come back again in 2 weeks if continued to have pain. At that time the dorsal having pain on consider imaging.

## 2013-06-02 NOTE — Progress Notes (Signed)
Pre-visit discussion using our clinic review tool. No additional management support is needed unless otherwise documented below in the visit note.  

## 2013-06-02 NOTE — [facility] Provider Note (Signed)
Chart reviewed and agree with management and plan.  

## 2013-06-02 NOTE — Patient Instructions (Signed)
Very nice to meet you Your wrist is sprain but will get better.  Wear the brace daily for 2-3 weeks.  Do exercises daily.  Try tylenol 325mg   3 times daily Neck try heat 20 minutes 3 times daily Come back again in 2 weeks if not better but I expect you will be doing well.       Tr?o C?  (Cervical Sprain)    Tr?o c? l m?t ch?n th??ng ? c?, trong ? cc dy ch?ng b? ko dn ho?c rch. Dy ch?ng l cc m gi? x??ng c? (??t s?ng) t?i ch?. Tr?o c? c th? c m?c ?? t? r?t nh? ??n r?t n?ng. Tr?o c? h?u h?t s? kh ln sau 1 ??n 3 tu?n, nh?ng n ph? thu?c vo nguyn nhn v m?c ?? ch?n th??ng. Tr?o c? nghim tr?ng c th? khi?n cho ??t s?ng c? khng ?n ??nh. ?i?u ny c th? d?n ??n t?n th??ng t?y s?ng v c th? gy ra v?n ?? nghim tr?ng ??i v?i h? th?n kinh. Chuyn gia ch?m [place] s?c kh?e s? xc ??nh xem b?n b? tr?o c? m?c ?? nh? hay n?ng.  NGUYN NHN  Tr?o c? n?ng c th? do:  Ch?n th??ng trong mn th? thao ti?p xc (bng ?, bng b?u d?c, v?t, khc cn c?u, ?ua  t, th? d?c, l?n, v thu?t, quy?n Anh).  Va ch?m xe c? gi?i.  Ch?n th??ng do t?ng ho?c gi?m t?c ?? ??t ng?t. ?i?u ny c ngh?a l c? b? ??p m?nh v? pha sau v v? pha tr??c.  T ng. Tr?o c? m?c ?? nh? c th? do:  T? th? v?ng v?, ch?ng h?n nh? k?p ?i?n tho?i gi?a tai v vai c?a b?n.  Ng?i gh? khng c h? tr? thch h?p.  Lm vi?c t?i m?t tr?m my tnh ???c thi?t k? km.  Ho?t ??ng ?i h?i ph?i nhn ln ho?c nhn xu?ng trong th?i gian di. TRI?U CH?NG  ?au, ?au nh?c, c?ng ho?c c?m gic b?ng rt ? pha tr??c, sau ho?c cc bn c?. C?m gic kh ch?u ny c th? c ngay l?p t?c sau ch?n th??ng ho?c n c th? c ch?m v khng b?t ??u trong 24 gi? ho?c lu h?n sau ch?n th??ng.  ?au ho?c nh?y c?m ?au ? vng gi?a gy.  ?au vai ho?c l?ng trn.  Kh? n?ng c? ??ng c? b? h?n ch?.  ?au ??u.  Hoa m?t.  Y?u, t ho?c ?au bu?t ? bn tay ho?c cnh tay.  Co th?t c?.  Kh nu?t ho?c kh nhai.  Nh?y c?m ?au v s?ng ? c?. CH?N ?ON  Chuyn gia ch?m [place]  s?c kh?e ph?n l?n c th? ch?n ?on v?n ?? ny b?ng cch xem b?nh s? c?a b?n v khm th?c th?Shaune Pascal gia ch?m [place] s?c kh?e s? h?i v? b?t k? v?n ?? no ? bi?t, ch?ng h?n nh? vim kh?p ? c? ho?c ch?n th??ng c? tr??c ?. C th? ch?p X-quang ?? xc ??nh xem c b?t k? v?n ?? no khc khng, ch?ng h?n nh? cc v?n ?? v?i x??ng c?. Tuy nhin, k?t qu? ch?p [place] th??ng khng th? hi?n m?c ?? ??y ?? c?a tr?o c?. C th? c?n cc xt nghi?m khc, ch?ng h?n nh? ch?p c?t l?p vi tnh (CT) ho?c ch?p c?ng h??ng t? (MRI).  ?I?U TR?  ?i?u tr? ph? thu?c vo m?c ?? nghim tr?ng c?a tr?o c?. Tr?o nh? c th? ???c ?i?u tr? b?ng vi?c ngh? ng?i, gi? c? ? nguyn v?  tr (c? ??nh) v thu?c gi?m ?au. Tr?o c? n?ng c?n c? ??nh ngay l?p t?c v g?p bc s? ch?nh hnh ho?c bc s? gi?i ph?u th?n kinh. M?t s? l?a ch?n ?i?u tr? c s?n ?? gip gi?m ?au, co th?t c? v cc tri?u ch?ng khc. Chuyn gia ch?m [place] s?c kh?e c th? ch? ??nh:  Thu?c, ch?ng h?n nh? thu?c gi?m ?au, thu?c t ho?c thu?c gin c?.  V?t l tr? li?u. Bi?n php ny c th? bao g?m cc bi t?p ko dn, bi t?p t?ng c??ng s?c b?n v t?p t? th?. Bi t?p v c?i thi?n t? th? c th? gip ?n ??nh c?, t?ng c??ng c? b?p v gip ng?n ch?n cc tri?u ch?ng quay tr? l?i.  D?ng c? ?? c? ???c ?eo trong th?i gian ng?n. D?ng c? ?? ny th??ng ???c ?eo ?? t?o c?m gic tho?i mi. Tuy nhin, m?t s? d?ng c? ?? c? nh?t ??nh c th? ???c ?eo ?? b?o v? c? v ng?n ch?n tr?o c? nghim tr?ng ti?n tri?n x?u ?i. H??NG D?N CH?M [place] T?I NH  Ch??m ? l?nh ln vng b? th??ng.  Cho ? l?nh vo ti nh?a.  ??t kh?n t?m gi?a da v ti.  Ch??m ? l?nh trong 15 ??n 20 pht, 3 ??n 4 l?n m?i ngy.  Ch? s? d?ng thu?c khng c?n k toa ho?c thu?c c?n k toa ?? gi?m ?au, gi?m c?m gic kh ch?u ho?c h? s?t theo ch? d?n c?a chuyn gia ch?m [place] s?c kh?e c?a b?n.  Tun th? m?i cu?c h?n khm l?i theo ch? d?n c?a chuyn gia ch?m [place] s?c kh?e.  Tun th? m?i cu?c h?n lm v?t l tr? li?u theo ch? d?n c?a chuyn gia ch?m [place] s?c  kh?e.  N?u d?ng c? ?? c? ???c k ??n, hy s? d?ng n theo ch? d?n c?a chuyn gia ch?m [place] s?c kh?e.  Khng li xe trong khi ?eo d?ng c? ?? c?.  Th?c hi?n m?i ?i?u ch?nh c?n thi?t ? n?i lm vi?c c?a b?n ?? c t? th? t?t.  Trnh nh?ng t? th? v ho?t ??ng lm cho cc tri?u ch?ng c?a b?n t?i t? h?n.  Kh?i ??ng v ko dn tr??c khi ho?t ??ng ?? gip phng ng?a v?n ??. HY ?I KHM N?U:  C?n ?au c?a b?n khng ki?m sot ???c b?ng thu?c.  B?n khng th? gi?m thu?c gi?m ?au theo th?i gian trong k? ho?ch.  M?c ?? ho?t ??ng c?a b?n khng c?i thi?n nh? mong ??i. HY NGAY L?P T?C ?I KHM N?U:  B?n b? b?t c? tnh tr?ng ch?y mu, ?au b?ng ho?c d?u hi?u ph?n ?ng d? ?ng no v?i thu?c.  Tri?u ch?ng c?a b?n n?ng h?n.  B?n c nh?ng tri?u ch?ng m?i khng r nguyn nhn.  B?n b? t, ?au bu?t, y?u ho?c li?t b?t c? ph?n no c?a c? th?. ??M B?O B?N:  Hi?u cc h??ng d?n ny.  S? theo di tnh tr?ng c?a mnh.  S? yu c?u tr? gip ngay l?p t?c n?u b?n c?m th?y khng ?? ho?c tnh tr?ng tr?m tr?ng h?n. Document Released: 06/19/2011 Document Revised: 03/02/2013  [facility] Patient Information 2014 [place], [place].   EXERCISES  RANGE OF MOTION (ROM) AND STRETCHING EXERCISES - Cervical Strain and Sprain  These exercises may help you when beginning to rehabilitate your injury. In order to successfully resolve your symptoms, you must improve your posture. These exercises are designed to help reduce the forward-head and rounded-shoulder posture which contributes to this condition.  Your symptoms may resolve with or without further involvement from your physician, physical therapist or athletic trainer. While completing these exercises, remember:  Restoring tissue flexibility helps normal motion to return to the joints. This allows healthier, less painful movement and activity.  An effective stretch should be held for at least 20 seconds, although you may need to begin with shorter hold times for comfort.  A stretch should never  be painful. You should only feel a gentle lengthening or release in the stretched tissue.   STRETCH- Axial Extensors  Lie on your back on the floor. You may bend your knees for comfort. Place a rolled up hand towel or dish towel, about 2 inches in diameter, under the part of your head that makes contact with the floor.  Gently tuck your chin, as if trying to make a "double chin," until you feel a gentle stretch at the base of your head.  Hold __________ seconds. Repeat __________ times. Complete this exercise __________ times per day.    STRETECH - Axial Extension  Stand or sit on a firm surface. Assume a good posture: chest up, shoulders drawn back, abdominal muscles slightly tense, knees unlocked (if standing) and feet hip width apart.  Slowly retract your chin so your head slides back and your chin slightly lowers.Continue to look straight ahead.  You should feel a gentle stretch in the back of your head. Be certain not to feel an aggressive stretch since this can cause headaches later.  Hold for __________ seconds. Repeat __________ times. Complete this exercise __________ times per day.    STRETCH Cervical Side Bend  Stand or sit on a firm surface. Assume a good posture: chest up, shoulders drawn back, abdominal muscles slightly tense, knees unlocked (if standing) and feet hip width apart.  Without letting your nose or shoulders move, slowly tip your right / left ear to your shoulder until your feel a gentle stretch in the muscles on the opposite side of your neck.  Hold __________ seconds. Repeat __________ times. Complete this exercise __________ times per day.    STRETCH Cervical Rotators  Stand or sit on a firm surface. Assume a good posture: chest up, shoulders drawn back, abdominal muscles slightly tense, knees unlocked (if standing) and feet hip width apart.  Keeping your eyes level with the ground, slowly turn your head until you feel a gentle stretch along the back and  opposite side of your neck.  Hold __________ seconds. Repeat __________ times. Complete this exercise __________ times per day.    RANGE OF MOTION - Neck Circles  Stand or sit on a firm surface. Assume a good posture: chest up, shoulders drawn back, abdominal muscles slightly tense, knees unlocked (if standing) and feet hip width apart.  Gently roll your head down and around from the back of one shoulder to the back of the other. The motion should never be forced or painful.  Repeat the motion 10-20 times, or until you feel the neck muscles relax and loosen. Repeat __________ times. Complete the exercise __________ times per day.  STRENGTHENING EXERCISES - Cervical Strain and Sprain  These exercises may help you when beginning to rehabilitate your injury. They may resolve your symptoms with or without further involvement from your physician, physical therapist or athletic trainer. While completing these exercises, remember:  Muscles can gain both the endurance and the strength needed for everyday activities through controlled exercises.  Complete these exercises as instructed by your physician, physical therapist or athletic trainer.  Progress the resistance and repetitions only as guided.  You may experience muscle soreness or fatigue, but the pain or discomfort you are trying to eliminate should never worsen during these exercises. If this pain does worsen, stop and make certain you are following the directions exactly. If the pain is still present after adjustments, discontinue the exercise until you can discuss the trouble with your clinician.   STRENGTH Cervical Flexors, Isometric  Face a wall, standing about 6 inches away. Place a small pillow, a ball about 6-8 inches in diameter, or a folded towel between your forehead and the wall.  Slightly tuck your chin and gently push your forehead into the soft object. Push only with mild to moderate intensity, building up tension gradually. Keep your  jaw and forehead relaxed.  Hold 10 to 20 seconds. Keep your breathing relaxed.  Release the tension slowly. Relax your neck muscles completely before you start the next repetition. Repeat __________ times. Complete this exercise __________ times per day.    STRENGTH- Cervical Lateral Flexors, Isometric  Stand about 6 inches away from a wall. Place a small pillow, a ball about 6-8 inches in diameter, or a folded towel between the side of your head and the wall.  Slightly tuck your chin and gently tilt your head into the soft object. Push only with mild to moderate intensity, building up tension gradually. Keep your jaw and forehead relaxed.  Hold 10 to 20 seconds. Keep your breathing relaxed.  Release the tension slowly. Relax your neck muscles completely before you start the next repetition. Repeat __________ times. Complete this exercise __________ times per day.    STRENGTH Cervical Extensors, Isometric  Stand about 6 inches away from a wall. Place a small pillow, a ball about 6-8 inches in diameter, or a folded towel between the back of your head and the wall.  Slightly tuck your chin and gently tilt your head back into the soft object. Push only with mild to moderate intensity, building up tension gradually. Keep your jaw and forehead relaxed.  Hold 10 to 20 seconds. Keep your breathing relaxed.  Release the tension slowly. Relax your neck muscles completely before you start the next repetition. Repeat __________ times. Complete this exercise __________ times per day.  POSTURE AND BODY MECHANICS CONSIDERATIONS - Cervical Strain and Sprain  Keeping correct posture when sitting, standing or completing your activities will reduce the stress put on different body tissues, allowing injured tissues a chance to heal and limiting painful experiences. The following are general guidelines for improved posture. Your physician or physical therapist will provide you with any instructions specific to your  needs. While reading these guidelines, remember:  The exercises prescribed by your provider will help you have the flexibility and strength to maintain correct postures.  The correct posture provides the optimal environment for your joints to work. All of your joints have less wear and tear when properly supported by a spine with good posture. This means you will experience a healthier, less painful body.  Correct posture must be practiced with all of your activities, especially prolonged sitting and standing. Correct posture is as important when doing repetitive low-stress activities (typing) as it is when doing a single heavy-load activity (lifting).   PROLONGED STANDING WHILE SLIGHTLY LEANING FORWARD  When completing a task that requires you to lean forward while standing in one place for a long time, place either foot up on a stationary 2-4 inch high object to help maintain the best posture.  When both feet are on the ground, the low back tends to lose its slight inward curve. If this curve flattens (or becomes too large), then the back and your other joints will experience too much stress, fatigue more quickly and can cause pain.    RESTING POSITIONS  Consider which positions are most painful for you when choosing a resting position. If you have pain with flexion-based activities (sitting, bending, stooping, squatting), choose a position that allows you to rest in a less flexed posture. You would want to avoid curling into a fetal position on your side. If your pain worsens with extension-based activities (prolonged standing, working overhead), avoid resting in an extended position such as sleeping on your stomach. Most people will find more comfort when they rest with their spine in a more neutral position, neither too rounded nor too arched. Lying on a non-sagging bed on your side with a pillow between your knees, or on your back with a pillow under your knees will often provide some relief. Keep in  mind, being in any one position for a prolonged period of time, no matter how correct your posture, can still lead to stiffness.    WALKING  Walk with an upright posture. Your ears, shoulders and hips should all line-up.  OFFICE WORK  When working at a desk, create an environment that supports good, upright posture. Without extra support, muscles fatigue and lead to excessive strain on joints and other tissues.    CHAIR:  A chair should be able to slide under your desk when your back makes contact with the back of the chair. This allows you to work closely.  The chair's height should allow your eyes to be level with the upper part of your monitor and your hands to be slightly lower than your elbows.  Body position:  Your feet should make contact with the floor. If this is not possible, use a foot rest.  Keep your ears over your shoulders. This will reduce stress on your neck and low back. Document Released: 06/30/2005 Document Revised: 10/25/2012 Document Reviewed: 10/12/2008  [facility] Patient Information 2014 [place], [place].  ?au C? Tay (Wrist Pain) Ch?n th??ng c? tay th??ng g?p ? ng??i l?n v tr? em. Bong gn l hi?n t??ng t?n th??ng trong cc gn gi? x??ng v?i nhau. C?ng qu m?c l hi?n t??ng t?n th??ng c? ho?c cc dy ch?ng c? b? ko c?ng. Thng th??ng, khi c? tay kh m?m khi ??ng vo sau khi ng ho?c b? th??ng, th??ng l c ch? gy. H?u h?t cc tr??ng h?p bong gn ho?c c?ng c? ? c? tay s? t? ?? d?n sau 3-5 ngy, nh?ng ?? lnh hon ton c th? m?t vi tu?n.  H??NG D?N CH?M [place] T?I NH  Ch??m ? l?nh ln vng b? th??ng.  Cho ? l?nh vo ti nh?a.  ??t kh?n t?m gi?a da v ti.  Ch??m ? l?nh trong 15 ??n 20 pht, 3 ??n 4 l?n m?t ngy, trong 2 ngy ??u tin.  Gi? cnh tay c?a b?n cao h?n ?? cao c?a tim b?t k? khi no c th? ?? gi?m ?au v s?ng.  Gi? cho vng b? th??ng ???c ngh? ng?i trong t nh?t 49 gi? ho?c theo ch? d?n c?a chuyn gia ch?m [place] s?c kh?e.  Mang thanh n?p trong  mi?n l theo s? ch? d?n c?a chuyn gia ch?m [place] y t? c?a b?n ho?c cho ??n khi ?i khm theo di.  Ch? dng cc thu?c khng c?n k  toa ho?c thu?c c?n k toa ?? gi?m ?au, gi?m c?m kh ch?u, ho?c h? s?t theo nh? h??ng d?n c?a Bc s?.  Tun theo t?t c? cc cu?c h?n khm l?i. B?n c th? c?n ph?i ??n g?p m?t chuyn gia ?? khm l?i ho?c ch?p X-quang l?i. M?c ?? ?au ?? h?n khng ??m b?o 100% l khng c hi?n t??ng gy. X-quang l cch duy nh?t ?? xc ??nh xem b?n c b? gy x??ng hay khng. HY NGAY L?P T?C ?I KHM N?U:  Ngn tay c?a b?n s?ng ?? t?y, ho?c tr?ng nh?t v l?nh ng?t, ho?c ti xanh.  Ngn tay c?a b?n b? t ho?c ng?a ran.  B?n b? ?au gia t?ng.  B?n g?p kh kh?n trong vi?c di chuy?n ngn tay c?a mnh. HY CH?C CH?N R?NG B?N:  Hi?u r nh?ng h??ng d?n ny.  S? theo di tnh tr?ng b?nh c?a b?n.  S? yu c?u tr? gip ngay l?p t?c n?u b?n khng ?? ho?c tnh tr?ng tr?m tr?ng h?n. Document Released: 06/30/2005 Document Revised: 03/02/2013 [facility] Patient Information 2014 [place], [place].
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
## 2013-06-15 ENCOUNTER — Ambulatory Visit (HOSPITAL_COMMUNITY): Payer: 59 | Attending: Advanced Practice Midwife

## 2013-06-29 ENCOUNTER — Encounter: Payer: Self-pay | Admitting: Advanced Practice Midwife

## 2013-06-29 ENCOUNTER — Ambulatory Visit (INDEPENDENT_AMBULATORY_CARE_PROVIDER_SITE_OTHER): Payer: 59 | Admitting: Advanced Practice Midwife

## 2013-06-29 VITALS — BP 111/73 | Wt 122.1 lb

## 2013-06-29 DIAGNOSIS — O34219 Maternal care for unspecified type scar from previous cesarean delivery: Secondary | ICD-10-CM

## 2013-06-29 LAB — POCT URINALYSIS DIP (DEVICE)
Hgb urine dipstick: NEGATIVE
Nitrite: NEGATIVE
Protein, ur: NEGATIVE mg/dL
Specific Gravity, Urine: 1.02 (ref 1.005–1.030)
Urobilinogen, UA: 0.2 mg/dL (ref 0.0–1.0)
pH: 7 (ref 5.0–8.0)
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
## 2013-06-29 NOTE — Progress Notes (Signed)
P - [phone] 

## 2013-06-29 NOTE — Patient Instructions (Signed)
Second Trimester of Pregnancy The second trimester is from week 13 through week 28, months 4 through 6. The second trimester is often a time when you feel your best. Your body has also adjusted to being pregnant, and you begin to feel better physically. Usually, morning sickness has lessened or quit completely, you may have more energy, and you may have an increase in appetite. The second trimester is also a time when the fetus is growing rapidly. At the end of the sixth month, the fetus is about 9 inches long and weighs about 1 pounds. You will likely begin to feel the baby move (quickening) between 18 and 20 weeks of the pregnancy. BODY CHANGES Your body goes through many changes during pregnancy. The changes vary from woman to woman.   Your weight will continue to increase. You will notice your lower abdomen bulging out.  You may begin to get stretch marks on your hips, abdomen, and breasts.  You may develop headaches that can be relieved by medicines approved by your caregiver.  You may urinate more often because the fetus is pressing on your bladder.  You may develop or continue to have heartburn as a result of your pregnancy.  You may develop constipation because certain hormones are causing the muscles that push waste through your intestines to slow down.  You may develop hemorrhoids or swollen, bulging veins (varicose veins).  You may have back pain because of the weight gain and pregnancy hormones relaxing your joints between the bones in your pelvis and as a result of a shift in weight and the muscles that support your balance.  Your breasts will continue to grow and be tender.  Your gums may bleed and may be sensitive to brushing and flossing.  Dark spots or blotches (chloasma, mask of pregnancy) may develop on your face. This will likely fade after the baby is born.  A dark line from your belly button to the pubic area (linea nigra) may appear. This will likely fade after the  baby is born. WHAT TO EXPECT AT YOUR PRENATAL VISITS During a routine prenatal visit:  You will be weighed to make sure you and the fetus are growing normally.  Your blood pressure will be taken.  Your abdomen will be measured to track your baby's growth.  The fetal heartbeat will be listened to.  Any test results from the previous visit will be discussed. Your caregiver may ask you:  How you are feeling.  If you are feeling the baby move.  If you have had any abnormal symptoms, such as leaking fluid, bleeding, severe headaches, or abdominal cramping.  If you have any questions. Other tests that may be performed during your second trimester include:  Blood tests that check for:  Low iron levels (anemia).  Gestational diabetes (between 24 and 28 weeks).  Rh antibodies.  Urine tests to check for infections, diabetes, or protein in the urine.  An ultrasound to confirm the proper growth and development of the baby.  An amniocentesis to check for possible genetic problems.  Fetal screens for spina bifida and Down syndrome. HOME CARE INSTRUCTIONS   Avoid all smoking, herbs, alcohol, and unprescribed drugs. These chemicals affect the formation and growth of the baby.  Follow your caregiver's instructions regarding medicine use. There are medicines that are either safe or unsafe to take during pregnancy.  Exercise only as directed by your caregiver. Experiencing uterine cramps is a good sign to stop exercising.  Continue to eat regular,   healthy meals.  Wear a good support bra for breast tenderness.  Do not use hot tubs, steam rooms, or saunas.  Wear your seat belt at all times when driving.  Avoid raw meat, uncooked cheese, cat litter boxes, and soil used by cats. These carry germs that can cause birth defects in the baby.  Take your prenatal vitamins.  Try taking a stool softener (if your caregiver approves) if you develop constipation. Eat more high-fiber foods,  such as fresh vegetables or fruit and whole grains. Drink plenty of fluids to keep your urine clear or pale yellow.  Take warm sitz baths to soothe any pain or discomfort caused by hemorrhoids. Use hemorrhoid cream if your caregiver approves.  If you develop varicose veins, wear support hose. Elevate your feet for 15 minutes, 3 4 times a day. Limit salt in your diet.  Avoid heavy lifting, wear low heel shoes, and practice good posture.  Rest with your legs elevated if you have leg cramps or low back pain.  Visit your dentist if you have not gone yet during your pregnancy. Use a soft toothbrush to brush your teeth and be gentle when you floss.  A sexual relationship may be continued unless your caregiver directs you otherwise.  Continue to go to all your prenatal visits as directed by your caregiver. SEEK MEDICAL CARE IF:   You have dizziness.  You have mild pelvic cramps, pelvic pressure, or nagging pain in the abdominal area.  You have persistent nausea, vomiting, or diarrhea.  You have a bad smelling vaginal discharge.  You have pain with urination. SEEK IMMEDIATE MEDICAL CARE IF:   You have a fever.  You are leaking fluid from your vagina.  You have spotting or bleeding from your vagina.  You have severe abdominal cramping or pain.  You have rapid weight gain or loss.  You have shortness of breath with chest pain.  You notice sudden or extreme swelling of your face, hands, ankles, feet, or legs.  You have not felt your baby move in over an hour.  You have severe headaches that do not go away with medicine.  You have vision changes. Document Released: 06/24/2001 Document Revised: 03/02/2013 Document Reviewed: 08/31/2012 Exit[employer] Patient Information 2014 [employer].  

## 2013-07-04 ENCOUNTER — Ambulatory Visit (HOSPITAL_COMMUNITY)
Admission: RE | Admit: 2013-07-04 | Discharge: 2013-07-04 | Disposition: A | Discharge status: Home / Self Care | Payer: 59 | Source: Ambulatory Visit | Attending: Advanced Practice Midwife | Admitting: Advanced Practice Midwife

## 2013-07-04 DIAGNOSIS — Z3689 Encounter for other specified antenatal screening: Secondary | ICD-10-CM | POA: Insufficient documentation

## 2013-07-04 DIAGNOSIS — O34219 Maternal care for unspecified type scar from previous cesarean delivery: Secondary | ICD-10-CM | POA: Insufficient documentation

## 2013-07-04 DIAGNOSIS — O09299 Supervision of pregnancy with other poor reproductive or obstetric history, unspecified trimester: Secondary | ICD-10-CM | POA: Diagnosis not present

## 2013-07-04 DIAGNOSIS — O09219 Supervision of pregnancy with history of pre-term labor, unspecified trimester: Secondary | ICD-10-CM | POA: Insufficient documentation

## 2013-07-04 DIAGNOSIS — Z3482 Encounter for supervision of other normal pregnancy, second trimester: Secondary | ICD-10-CM

## 2013-07-14 NOTE — L&D Delivery Note (Signed)
Delivery Note At 11:53 AM a healthy female was delivered via Vaginal, Spontaneous Delivery (Presentation: ; Occiput Anterior).  APGAR: 7, 9; weight 7 lb 3.3 oz (3270 g).   Placenta status: Intact, Spontaneous.  Cord: 3 vessels with the following complications: None.  Cord pH: NA  Anesthesia: None  Episiotomy: None Lacerations: None Suture Repair: NA Est. Blood Loss (250mL):   Upon arrival she had an anterior and right cervical rim, but was actively pushing. She pushed with good maternal effort to deliver a healthy boy. There was a loose nuchal cord x 1 that was reduced post delivery. Baby delivered without difficulty and place on maternal abdomen. Delayed cord clamping performed and cut by MD. Placenta delivered intact with 3V cord. Mom to postpartum and baby to skin to skin. Counts were correct.   Mom to postpartum.  Baby to Couplet care / Skin to Skin.  Wenda LowJoyner, James 09/28/2013, 12:12 PM  I was present and agree with resident's note and plan of care.  Tawana ScaleMichael Ryan Bernisha Verma, MD OB Fellow 09/28/2013 1:26 PM

## 2013-07-20 ENCOUNTER — Encounter: Payer: Self-pay | Admitting: Family Medicine

## 2013-07-20 ENCOUNTER — Ambulatory Visit (INDEPENDENT_AMBULATORY_CARE_PROVIDER_SITE_OTHER): Payer: 59 | Admitting: Family Medicine

## 2013-07-20 VITALS — BP 113/70 | Temp 97.3°F | Wt 123.1 lb

## 2013-07-20 DIAGNOSIS — Z3483 Encounter for supervision of other normal pregnancy, third trimester: Secondary | ICD-10-CM

## 2013-07-20 DIAGNOSIS — Z3480 Encounter for supervision of other normal pregnancy, unspecified trimester: Secondary | ICD-10-CM

## 2013-07-20 LAB — POCT URINALYSIS DIP (DEVICE)
BILIRUBIN URINE: NEGATIVE
GLUCOSE, UA: NEGATIVE mg/dL
Hgb urine dipstick: NEGATIVE
KETONES UR: NEGATIVE mg/dL
Leukocytes, UA: NEGATIVE
Nitrite: NEGATIVE
Protein, ur: NEGATIVE mg/dL
SPECIFIC GRAVITY, URINE: 1.025 (ref 1.005–1.030)
UROBILINOGEN UA: 0.2 mg/dL (ref 0.0–1.0)
pH: 6.5 (ref 5.0–8.0)
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
## 2013-07-20 NOTE — Progress Notes (Signed)
Pulse- 91 Patient states she cannot stay for 1 hour today because she has to get her husband's car back for him to go to work, but can come back later this week for it.

## 2013-07-20 NOTE — Patient Instructions (Signed)
Third Trimester of Pregnancy  The third trimester is from week 29 through week 42, months 7 through 9. The third trimester is a time when the fetus is growing rapidly. At the end of the ninth month, the fetus is about 20 inches in length and weighs 6 10 pounds.   BODY CHANGES  Your body goes through many changes during pregnancy. The changes vary from woman to woman.    Your weight will continue to increase. You can expect to gain 25 35 pounds (11 16 kg) by the end of the pregnancy.   You may begin to get stretch marks on your hips, abdomen, and breasts.   You may urinate more often because the fetus is moving lower into your pelvis and pressing on your bladder.   You may develop or continue to have heartburn as a result of your pregnancy.   You may develop constipation because certain hormones are causing the muscles that push waste through your intestines to slow down.   You may develop hemorrhoids or swollen, bulging veins (varicose veins).   You may have pelvic pain because of the weight gain and pregnancy hormones relaxing your joints between the bones in your pelvis. Back aches may result from over exertion of the muscles supporting your posture.   Your breasts will continue to grow and be tender. A yellow discharge may leak from your breasts called colostrum.   Your belly button may stick out.   You may feel short of breath because of your expanding uterus.   You may notice the fetus "dropping," or moving lower in your abdomen.   You may have a bloody mucus discharge. This usually occurs a few days to a week before labor begins.   Your cervix becomes thin and soft (effaced) near your due date.  WHAT TO EXPECT AT YOUR PRENATAL EXAMS   You will have prenatal exams every 2 weeks until week 36. Then, you will have weekly prenatal exams. During a routine prenatal visit:   You will be weighed to make sure you and the fetus are growing normally.   Your blood pressure is taken.   Your abdomen will be  measured to track your baby's growth.   The fetal heartbeat will be listened to.   Any test results from the previous visit will be discussed.   You may have a cervical check near your due date to see if you have effaced.  At around 36 weeks, your caregiver will check your cervix. At the same time, your caregiver will also perform a test on the secretions of the vaginal tissue. This test is to determine if a type of bacteria, Group B streptococcus, is present. Your caregiver will explain this further.  Your caregiver may ask you:   What your birth plan is.   How you are feeling.   If you are feeling the baby move.   If you have had any abnormal symptoms, such as leaking fluid, bleeding, severe headaches, or abdominal cramping.   If you have any questions.  Other tests or screenings that may be performed during your third trimester include:   Blood tests that check for low iron levels (anemia).   Fetal testing to check the health, activity level, and growth of the fetus. Testing is done if you have certain medical conditions or if there are problems during the pregnancy.  FALSE LABOR  You may feel small, irregular contractions that eventually go away. These are called Braxton Hicks contractions, or   false labor. Contractions may last for hours, days, or even weeks before true labor sets in. If contractions come at regular intervals, intensify, or become painful, it is best to be seen by your caregiver.   SIGNS OF LABOR    Menstrual-like cramps.   Contractions that are 5 minutes apart or less.   Contractions that start on the top of the uterus and spread down to the lower abdomen and back.   A sense of increased pelvic pressure or back pain.   A watery or bloody mucus discharge that comes from the vagina.  If you have any of these signs before the 37th week of pregnancy, call your caregiver right away. You need to go to the hospital to get checked immediately.  HOME CARE INSTRUCTIONS    Avoid all  smoking, herbs, alcohol, and unprescribed drugs. These chemicals affect the formation and growth of the baby.   Follow your caregiver's instructions regarding medicine use. There are medicines that are either safe or unsafe to take during pregnancy.   Exercise only as directed by your caregiver. Experiencing uterine cramps is a good sign to stop exercising.   Continue to eat regular, healthy meals.   Wear a good support bra for breast tenderness.   Do not use hot tubs, steam rooms, or saunas.   Wear your seat belt at all times when driving.   Avoid raw meat, uncooked cheese, cat litter boxes, and soil used by cats. These carry germs that can cause birth defects in the baby.   Take your prenatal vitamins.   Try taking a stool softener (if your caregiver approves) if you develop constipation. Eat more high-fiber foods, such as fresh vegetables or fruit and whole grains. Drink plenty of fluids to keep your urine clear or pale yellow.   Take warm sitz baths to soothe any pain or discomfort caused by hemorrhoids. Use hemorrhoid cream if your caregiver approves.   If you develop varicose veins, wear support hose. Elevate your feet for 15 minutes, 3 4 times a day. Limit salt in your diet.   Avoid heavy lifting, wear low heal shoes, and practice good posture.   Rest a lot with your legs elevated if you have leg cramps or low back pain.   Visit your dentist if you have not gone during your pregnancy. Use a soft toothbrush to brush your teeth and be gentle when you floss.   A sexual relationship may be continued unless your caregiver directs you otherwise.   Do not travel far distances unless it is absolutely necessary and only with the approval of your caregiver.   Take prenatal classes to understand, practice, and ask questions about the labor and delivery.   Make a trial run to the hospital.   Pack your hospital bag.   Prepare the baby's nursery.   Continue to go to all your prenatal visits as directed  by your caregiver.  SEEK MEDICAL CARE IF:   You are unsure if you are in labor or if your water has broken.   You have dizziness.   You have mild pelvic cramps, pelvic pressure, or nagging pain in your abdominal area.   You have persistent nausea, vomiting, or diarrhea.   You have a bad smelling vaginal discharge.   You have pain with urination.  SEEK IMMEDIATE MEDICAL CARE IF:    You have a fever.   You are leaking fluid from your vagina.   You have spotting or bleeding from your vagina.     You have severe abdominal cramping or pain.   You have rapid weight loss or gain.   You have shortness of breath with chest pain.   You notice sudden or extreme swelling of your face, hands, ankles, feet, or legs.   You have not felt your baby move in over an hour.   You have severe headaches that do not go away with medicine.   You have vision changes.  Document Released: 06/24/2001 Document Revised: 03/02/2013 Document Reviewed: 08/31/2012  [employer] Patient Information 2014 [employer].

## 2013-07-26 ENCOUNTER — Other Ambulatory Visit: Payer: 59

## 2013-07-27 ENCOUNTER — Other Ambulatory Visit: Payer: Medicaid Other

## 2013-07-27 DIAGNOSIS — Z3480 Encounter for supervision of other normal pregnancy, unspecified trimester: Secondary | ICD-10-CM

## 2013-07-27 LAB — CBC
HCT: 36.1 % (ref 36.0–46.0)
Hemoglobin: 11.9 g/dL — ABNORMAL LOW (ref 12.0–15.0)
MCH: 23.6 pg — ABNORMAL LOW (ref 26.0–34.0)
MCHC: 33 g/dL (ref 30.0–36.0)
MCV: 71.6 fL — ABNORMAL LOW (ref 78.0–100.0)
Platelets: 164 10*3/uL (ref 150–400)
RBC: 5.04 MIL/uL (ref 3.87–5.11)
RDW: 15.7 % — AB (ref 11.5–15.5)
WBC: 6.8 10*3/uL (ref 4.0–10.5)

## 2013-07-28 LAB — HIV ANTIBODY (ROUTINE TESTING W REFLEX): HIV: NONREACTIVE

## 2013-07-28 LAB — GLUCOSE TOLERANCE, 1 HOUR (50G) W/O FASTING: Glucose, 1 Hour GTT: 141 mg/dL — ABNORMAL HIGH (ref 70–140)

## 2013-07-28 LAB — RPR

## 2013-07-31 ENCOUNTER — Encounter: Payer: Self-pay | Admitting: Family Medicine

## 2013-08-01 ENCOUNTER — Telehealth: Payer: Self-pay

## 2013-08-01 NOTE — Telephone Encounter (Signed)
Message copied by Louanna RawAMPBELL, Rafe Mackowski M on Mon Aug 01, 2013  4:28 PM ------      Message from: Jolyn LentDOM, MICHAEL R      Created: Sun Jul 31, 2013 10:58 AM       Elevated glucose 1hr - needs to return for 3hr ------

## 2013-08-01 NOTE — Telephone Encounter (Signed)
Pt. Has an appointment 08/03/13. Will not send letter. Edited appointment notes so that pt. Will scehdule 3hr gtt while she is here.

## 2013-08-01 NOTE — Telephone Encounter (Signed)
Unable to get an interpreter via Education officer, [employer]ific Interpreter for [employer]. Will send letter to patient.

## 2013-08-03 ENCOUNTER — Ambulatory Visit (HOSPITAL_COMMUNITY)
Admission: RE | Admit: 2013-08-03 | Discharge: 2013-08-03 | Disposition: A | Discharge status: Home / Self Care | Payer: Medicaid Other | Source: Ambulatory Visit | Attending: Advanced Practice Midwife | Admitting: Advanced Practice Midwife

## 2013-08-03 ENCOUNTER — Ambulatory Visit (INDEPENDENT_AMBULATORY_CARE_PROVIDER_SITE_OTHER): Payer: Medicaid Other | Admitting: Advanced Practice Midwife

## 2013-08-03 VITALS — BP 113/75 | Temp 96.7°F | Wt 123.9 lb

## 2013-08-03 DIAGNOSIS — O09219 Supervision of pregnancy with history of pre-term labor, unspecified trimester: Secondary | ICD-10-CM | POA: Insufficient documentation

## 2013-08-03 DIAGNOSIS — IMO0002 Reserved for concepts with insufficient information to code with codable children: Secondary | ICD-10-CM

## 2013-08-03 DIAGNOSIS — O365990 Maternal care for other known or suspected poor fetal growth, unspecified trimester, not applicable or unspecified: Secondary | ICD-10-CM | POA: Insufficient documentation

## 2013-08-03 DIAGNOSIS — O34219 Maternal care for unspecified type scar from previous cesarean delivery: Secondary | ICD-10-CM

## 2013-08-03 DIAGNOSIS — O09299 Supervision of pregnancy with other poor reproductive or obstetric history, unspecified trimester: Secondary | ICD-10-CM | POA: Insufficient documentation

## 2013-08-03 LAB — POCT URINALYSIS DIP (DEVICE)
Bilirubin Urine: NEGATIVE
GLUCOSE, UA: NEGATIVE mg/dL
Hgb urine dipstick: NEGATIVE
Ketones, ur: NEGATIVE mg/dL
NITRITE: NEGATIVE
PROTEIN: 30 mg/dL — AB
Specific Gravity, Urine: 1.025 (ref 1.005–1.030)
UROBILINOGEN UA: 0.2 mg/dL (ref 0.0–1.0)
pH: 7 (ref 5.0–8.0)

## 2013-08-03 NOTE — Patient Instructions (Signed)
Third Trimester of Pregnancy  The third trimester is from week 29 through week 42, months 7 through 9. The third trimester is a time when the fetus is growing rapidly. At the end of the ninth month, the fetus is about 20 inches in length and weighs 6 10 pounds.   BODY CHANGES  Your body goes through many changes during pregnancy. The changes vary from woman to woman.    Your weight will continue to increase. You can expect to gain 25 35 pounds (11 16 kg) by the end of the pregnancy.   You may begin to get stretch marks on your hips, abdomen, and breasts.   You may urinate more often because the fetus is moving lower into your pelvis and pressing on your bladder.   You may develop or continue to have heartburn as a result of your pregnancy.   You may develop constipation because certain hormones are causing the muscles that push waste through your intestines to slow down.   You may develop hemorrhoids or swollen, bulging veins (varicose veins).   You may have pelvic pain because of the weight gain and pregnancy hormones relaxing your joints between the bones in your pelvis. Back aches may result from over exertion of the muscles supporting your posture.   Your breasts will continue to grow and be tender. A yellow discharge may leak from your breasts called colostrum.   Your belly button may stick out.   You may feel short of breath because of your expanding uterus.   You may notice the fetus "dropping," or moving lower in your abdomen.   You may have a bloody mucus discharge. This usually occurs a few days to a week before labor begins.   Your cervix becomes thin and soft (effaced) near your due date.  WHAT TO EXPECT AT YOUR PRENATAL EXAMS   You will have prenatal exams every 2 weeks until week 36. Then, you will have weekly prenatal exams. During a routine prenatal visit:   You will be weighed to make sure you and the fetus are growing normally.   Your blood pressure is taken.   Your abdomen will be  measured to track your baby's growth.   The fetal heartbeat will be listened to.   Any test results from the previous visit will be discussed.   You may have a cervical check near your due date to see if you have effaced.  At around 36 weeks, your caregiver will check your cervix. At the same time, your caregiver will also perform a test on the secretions of the vaginal tissue. This test is to determine if a type of bacteria, Group B streptococcus, is present. Your caregiver will explain this further.  Your caregiver may ask you:   What your birth plan is.   How you are feeling.   If you are feeling the baby move.   If you have had any abnormal symptoms, such as leaking fluid, bleeding, severe headaches, or abdominal cramping.   If you have any questions.  Other tests or screenings that may be performed during your third trimester include:   Blood tests that check for low iron levels (anemia).   Fetal testing to check the health, activity level, and growth of the fetus. Testing is done if you have certain medical conditions or if there are problems during the pregnancy.  FALSE LABOR  You may feel small, irregular contractions that eventually go away. These are called Braxton Hicks contractions, or   false labor. Contractions may last for hours, days, or even weeks before true labor sets in. If contractions come at regular intervals, intensify, or become painful, it is best to be seen by your caregiver.   SIGNS OF LABOR    Menstrual-like cramps.   Contractions that are 5 minutes apart or less.   Contractions that start on the top of the uterus and spread down to the lower abdomen and back.   A sense of increased pelvic pressure or back pain.   A watery or bloody mucus discharge that comes from the vagina.  If you have any of these signs before the 37th week of pregnancy, call your caregiver right away. You need to go to the hospital to get checked immediately.  HOME CARE INSTRUCTIONS    Avoid all  smoking, herbs, alcohol, and unprescribed drugs. These chemicals affect the formation and growth of the baby.   Follow your caregiver's instructions regarding medicine use. There are medicines that are either safe or unsafe to take during pregnancy.   Exercise only as directed by your caregiver. Experiencing uterine cramps is a good sign to stop exercising.   Continue to eat regular, healthy meals.   Wear a good support bra for breast tenderness.   Do not use hot tubs, steam rooms, or saunas.   Wear your seat belt at all times when driving.   Avoid raw meat, uncooked cheese, cat litter boxes, and soil used by cats. These carry germs that can cause birth defects in the baby.   Take your prenatal vitamins.   Try taking a stool softener (if your caregiver approves) if you develop constipation. Eat more high-fiber foods, such as fresh vegetables or fruit and whole grains. Drink plenty of fluids to keep your urine clear or pale yellow.   Take warm sitz baths to soothe any pain or discomfort caused by hemorrhoids. Use hemorrhoid cream if your caregiver approves.   If you develop varicose veins, wear support hose. Elevate your feet for 15 minutes, 3 4 times a day. Limit salt in your diet.   Avoid heavy lifting, wear low heal shoes, and practice good posture.   Rest a lot with your legs elevated if you have leg cramps or low back pain.   Visit your dentist if you have not gone during your pregnancy. Use a soft toothbrush to brush your teeth and be gentle when you floss.   A sexual relationship may be continued unless your caregiver directs you otherwise.   Do not travel far distances unless it is absolutely necessary and only with the approval of your caregiver.   Take prenatal classes to understand, practice, and ask questions about the labor and delivery.   Make a trial run to the hospital.   Pack your hospital bag.   Prepare the baby's nursery.   Continue to go to all your prenatal visits as directed  by your caregiver.  SEEK MEDICAL CARE IF:   You are unsure if you are in labor or if your water has broken.   You have dizziness.   You have mild pelvic cramps, pelvic pressure, or nagging pain in your abdominal area.   You have persistent nausea, vomiting, or diarrhea.   You have a bad smelling vaginal discharge.   You have pain with urination.  SEEK IMMEDIATE MEDICAL CARE IF:    You have a fever.   You are leaking fluid from your vagina.   You have spotting or bleeding from your vagina.     You have severe abdominal cramping or pain.   You have rapid weight loss or gain.   You have shortness of breath with chest pain.   You notice sudden or extreme swelling of your face, hands, ankles, feet, or legs.   You have not felt your baby move in over an hour.   You have severe headaches that do not go away with medicine.   You have vision changes.  Document Released: 06/24/2001 Document Revised: 03/02/2013 Document Reviewed: 08/31/2012  [employer] Patient Information 2014 [employer].

## 2013-08-03 NOTE — Progress Notes (Signed)
Pulse- 75 Informed pt of need for 3hr pt stated that she will be able to make appt for 08/04/13 @0800 .

## 2013-08-03 NOTE — Progress Notes (Signed)
Size less than dates again. Will order US for growth to rule out IUGR vs constitutionally small baby.

## 2013-08-04 ENCOUNTER — Other Ambulatory Visit: Payer: Medicaid Other

## 2013-08-04 DIAGNOSIS — O99810 Abnormal glucose complicating pregnancy: Secondary | ICD-10-CM

## 2013-08-05 ENCOUNTER — Encounter: Payer: Self-pay | Admitting: Obstetrics & Gynecology

## 2013-08-05 LAB — GLUCOSE TOLERANCE, 3 HOURS
GLUCOSE, FASTING-GESTATIONAL: 67 mg/dL — AB (ref 70–104)
Glucose Tolerance, 1 hour: 122 mg/dL (ref 70–189)
Glucose Tolerance, 2 hour: 73 mg/dL (ref 70–164)
Glucose, GTT - 3 Hour: 109 mg/dL (ref 70–144)

## 2013-08-08 ENCOUNTER — Encounter: Payer: Self-pay | Admitting: Advanced Practice Midwife

## 2013-08-17 ENCOUNTER — Encounter: Payer: Medicaid Other | Admitting: Advanced Practice Midwife

## 2013-08-24 ENCOUNTER — Encounter: Payer: Medicaid Other | Admitting: Advanced Practice Midwife

## 2013-08-31 ENCOUNTER — Ambulatory Visit (INDEPENDENT_AMBULATORY_CARE_PROVIDER_SITE_OTHER): Payer: Medicaid Other | Admitting: Family

## 2013-08-31 VITALS — BP 119/79 | Temp 96.8°F | Wt 127.6 lb

## 2013-08-31 DIAGNOSIS — Z3480 Encounter for supervision of other normal pregnancy, unspecified trimester: Secondary | ICD-10-CM

## 2013-08-31 DIAGNOSIS — O34219 Maternal care for unspecified type scar from previous cesarean delivery: Secondary | ICD-10-CM

## 2013-08-31 LAB — POCT URINALYSIS DIP (DEVICE)
Bilirubin Urine: NEGATIVE
Glucose, UA: NEGATIVE mg/dL
Ketones, ur: NEGATIVE mg/dL
Leukocytes, UA: NEGATIVE
Nitrite: NEGATIVE
PH: 6.5 (ref 5.0–8.0)
PROTEIN: NEGATIVE mg/dL
Specific Gravity, Urine: 1.02 (ref 1.005–1.030)
Urobilinogen, UA: 0.2 mg/dL (ref 0.0–1.0)

## 2013-08-31 NOTE — Progress Notes (Signed)
Reviewed 3 hr result and ultrasound result with patient - 3 hr normal and Growth Ultrasound 71%ile.  Pt not here with interpreter and language line did not have appropriate language - pt answered and asked questions appropriately.  Will obtain TOLAC consent when interpreter available.

## 2013-08-31 NOTE — Progress Notes (Signed)
Pulse: 90

## 2013-09-14 ENCOUNTER — Ambulatory Visit (INDEPENDENT_AMBULATORY_CARE_PROVIDER_SITE_OTHER): Payer: Medicaid Other | Admitting: Family Medicine

## 2013-09-14 ENCOUNTER — Encounter: Payer: Self-pay | Admitting: Family Medicine

## 2013-09-14 VITALS — BP 117/78 | Temp 96.9°F | Wt 128.9 lb

## 2013-09-14 DIAGNOSIS — O34219 Maternal care for unspecified type scar from previous cesarean delivery: Secondary | ICD-10-CM

## 2013-09-14 DIAGNOSIS — Z23 Encounter for immunization: Secondary | ICD-10-CM

## 2013-09-14 DIAGNOSIS — Z3480 Encounter for supervision of other normal pregnancy, unspecified trimester: Secondary | ICD-10-CM

## 2013-09-14 LAB — POCT URINALYSIS DIP (DEVICE)
Bilirubin Urine: NEGATIVE
Glucose, UA: NEGATIVE mg/dL
Hgb urine dipstick: NEGATIVE
Ketones, ur: NEGATIVE mg/dL
Leukocytes, UA: NEGATIVE
NITRITE: NEGATIVE
PH: 7 (ref 5.0–8.0)
PROTEIN: NEGATIVE mg/dL
Specific Gravity, Urine: 1.02 (ref 1.005–1.030)
Urobilinogen, UA: 0.2 mg/dL (ref 0.0–1.0)

## 2013-09-14 LAB — OB RESULTS CONSOLE GC/CHLAMYDIA
Chlamydia: NEGATIVE
Gonorrhea: NEGATIVE

## 2013-09-14 LAB — OB RESULTS CONSOLE GBS: STREP GROUP B AG: POSITIVE

## 2013-09-14 MED ORDER — TETANUS-DIPHTH-ACELL PERTUSSIS 5-2.5-18.5 LF-MCG/0.5 IM SUSP: 0.5000 mL | Freq: Once | INTRAMUSCULAR | Status: DC

## 2013-09-14 NOTE — Progress Notes (Signed)
VBAC Consent signed and scanned.

## 2013-09-14 NOTE — Patient Instructions (Signed)
Third Trimester of Pregnancy  The third trimester is from week 29 through week 42, months 7 through 9. The third trimester is a time when the fetus is growing rapidly. At the end of the ninth month, the fetus is about 20 inches in length and weighs 6 10 pounds.   BODY CHANGES  Your body goes through many changes during pregnancy. The changes vary from woman to woman.    Your weight will continue to increase. You can expect to gain 25 35 pounds (11 16 kg) by the end of the pregnancy.   You may begin to get stretch marks on your hips, abdomen, and breasts.   You may urinate more often because the fetus is moving lower into your pelvis and pressing on your bladder.   You may develop or continue to have heartburn as a result of your pregnancy.   You may develop constipation because certain hormones are causing the muscles that push waste through your intestines to slow down.   You may develop hemorrhoids or swollen, bulging veins (varicose veins).   You may have pelvic pain because of the weight gain and pregnancy hormones relaxing your joints between the bones in your pelvis. Back aches may result from over exertion of the muscles supporting your posture.   Your breasts will continue to grow and be tender. A yellow discharge may leak from your breasts called colostrum.   Your belly button may stick out.   You may feel short of breath because of your expanding uterus.   You may notice the fetus "dropping," or moving lower in your abdomen.   You may have a bloody mucus discharge. This usually occurs a few days to a week before labor begins.   Your cervix becomes thin and soft (effaced) near your due date.  WHAT TO EXPECT AT YOUR PRENATAL EXAMS   You will have prenatal exams every 2 weeks until week 36. Then, you will have weekly prenatal exams. During a routine prenatal visit:   You will be weighed to make sure you and the fetus are growing normally.   Your blood pressure is taken.   Your abdomen will be  measured to track your baby's growth.   The fetal heartbeat will be listened to.   Any test results from the previous visit will be discussed.   You may have a cervical check near your due date to see if you have effaced.  At around 36 weeks, your caregiver will check your cervix. At the same time, your caregiver will also perform a test on the secretions of the vaginal tissue. This test is to determine if a type of bacteria, Group B streptococcus, is present. Your caregiver will explain this further.  Your caregiver may ask you:   What your birth plan is.   How you are feeling.   If you are feeling the baby move.   If you have had any abnormal symptoms, such as leaking fluid, bleeding, severe headaches, or abdominal cramping.   If you have any questions.  Other tests or screenings that may be performed during your third trimester include:   Blood tests that check for low iron levels (anemia).   Fetal testing to check the health, activity level, and growth of the fetus. Testing is done if you have certain medical conditions or if there are problems during the pregnancy.  FALSE LABOR  You may feel small, irregular contractions that eventually go away. These are called Braxton Hicks contractions, or   false labor. Contractions may last for hours, days, or even weeks before true labor sets in. If contractions come at regular intervals, intensify, or become painful, it is best to be seen by your caregiver.   SIGNS OF LABOR    Menstrual-like cramps.   Contractions that are 5 minutes apart or less.   Contractions that start on the top of the uterus and spread down to the lower abdomen and back.   A sense of increased pelvic pressure or back pain.   A watery or bloody mucus discharge that comes from the vagina.  If you have any of these signs before the 37th week of pregnancy, call your caregiver right away. You need to go to the hospital to get checked immediately.  HOME CARE INSTRUCTIONS    Avoid all  smoking, herbs, alcohol, and unprescribed drugs. These chemicals affect the formation and growth of the baby.   Follow your caregiver's instructions regarding medicine use. There are medicines that are either safe or unsafe to take during pregnancy.   Exercise only as directed by your caregiver. Experiencing uterine cramps is a good sign to stop exercising.   Continue to eat regular, healthy meals.   Wear a good support bra for breast tenderness.   Do not use hot tubs, steam rooms, or saunas.   Wear your seat belt at all times when driving.   Avoid raw meat, uncooked cheese, cat litter boxes, and soil used by cats. These carry germs that can cause birth defects in the baby.   Take your prenatal vitamins.   Try taking a stool softener (if your caregiver approves) if you develop constipation. Eat more high-fiber foods, such as fresh vegetables or fruit and whole grains. Drink plenty of fluids to keep your urine clear or pale yellow.   Take warm sitz baths to soothe any pain or discomfort caused by hemorrhoids. Use hemorrhoid cream if your caregiver approves.   If you develop varicose veins, wear support hose. Elevate your feet for 15 minutes, 3 4 times a day. Limit salt in your diet.   Avoid heavy lifting, wear low heal shoes, and practice good posture.   Rest a lot with your legs elevated if you have leg cramps or low back pain.   Visit your dentist if you have not gone during your pregnancy. Use a soft toothbrush to brush your teeth and be gentle when you floss.   A sexual relationship may be continued unless your caregiver directs you otherwise.   Do not travel far distances unless it is absolutely necessary and only with the approval of your caregiver.   Take prenatal classes to understand, practice, and ask questions about the labor and delivery.   Make a trial run to the hospital.   Pack your hospital bag.   Prepare the baby's nursery.   Continue to go to all your prenatal visits as directed  by your caregiver.  SEEK MEDICAL CARE IF:   You are unsure if you are in labor or if your water has broken.   You have dizziness.   You have mild pelvic cramps, pelvic pressure, or nagging pain in your abdominal area.   You have persistent nausea, vomiting, or diarrhea.   You have a bad smelling vaginal discharge.   You have pain with urination.  SEEK IMMEDIATE MEDICAL CARE IF:    You have a fever.   You are leaking fluid from your vagina.   You have spotting or bleeding from your vagina.     You have severe abdominal cramping or pain.   You have rapid weight loss or gain.   You have shortness of breath with chest pain.   You notice sudden or extreme swelling of your face, hands, ankles, feet, or legs.   You have not felt your baby move in over an hour.   You have severe headaches that do not go away with medicine.   You have vision changes.  Document Released: 06/24/2001 Document Revised: 03/02/2013 Document Reviewed: 08/31/2012  [employer] Patient Information 2014 [employer].

## 2013-09-14 NOTE — Addendum Note (Signed)
Addended by: Kathee DeltonHILLMAN, CARRIE L on: 09/14/2013 08:58 AM   Modules accepted: Orders

## 2013-09-14 NOTE — Addendum Note (Signed)
Addended by: Vale HavenBECK, Avilene Marrin L on: 09/14/2013 09:08 AM   Modules accepted: Orders

## 2013-09-14 NOTE — Progress Notes (Signed)
33 yo [id number] @ 6621w0d here ROBV - +FM - no lof, vb, ctx.   O: see flowhsheet  A/P - labor precautions done VBAC consent signed today Cultures done F/u in 1 week

## 2013-09-14 NOTE — Progress Notes (Signed)
P= 82 GBS, cultures and tdap today.

## 2013-09-15 LAB — GC/CHLAMYDIA PROBE AMP
CT Probe RNA: NEGATIVE
GC Probe RNA: NEGATIVE

## 2013-09-16 ENCOUNTER — Encounter: Payer: Self-pay | Admitting: Family Medicine

## 2013-09-16 LAB — CULTURE, BETA STREP (GROUP B ONLY)

## 2013-09-28 ENCOUNTER — Ambulatory Visit (INDEPENDENT_AMBULATORY_CARE_PROVIDER_SITE_OTHER): Payer: Medicaid Other | Admitting: Advanced Practice Midwife

## 2013-09-28 ENCOUNTER — Encounter: Payer: Self-pay | Admitting: Advanced Practice Midwife

## 2013-09-28 ENCOUNTER — Inpatient Hospital Stay (HOSPITAL_COMMUNITY)
Admission: AD | Admit: 2013-09-28 | Discharge: 2013-09-30 | DRG: 774 | Disposition: A | Discharge status: Home / Self Care | Payer: Medicaid Other | Source: Ambulatory Visit | Attending: Obstetrics & Gynecology | Admitting: Obstetrics & Gynecology

## 2013-09-28 ENCOUNTER — Encounter (HOSPITAL_COMMUNITY): Payer: Self-pay | Admitting: *Deleted

## 2013-09-28 VITALS — Wt 130.9 lb

## 2013-09-28 DIAGNOSIS — O2650 Maternal hypotension syndrome, unspecified trimester: Secondary | ICD-10-CM

## 2013-09-28 DIAGNOSIS — IMO0001 Reserved for inherently not codable concepts without codable children: Secondary | ICD-10-CM

## 2013-09-28 DIAGNOSIS — O99892 Other specified diseases and conditions complicating childbirth: Secondary | ICD-10-CM | POA: Diagnosis present

## 2013-09-28 DIAGNOSIS — O9989 Other specified diseases and conditions complicating pregnancy, childbirth and the puerperium: Secondary | ICD-10-CM

## 2013-09-28 DIAGNOSIS — O34219 Maternal care for unspecified type scar from previous cesarean delivery: Secondary | ICD-10-CM

## 2013-09-28 DIAGNOSIS — Z22330 Carrier of Group B streptococcus: Secondary | ICD-10-CM

## 2013-09-28 LAB — CBC
HCT: 32.8 % — ABNORMAL LOW (ref 36.0–46.0)
HEMATOCRIT: 39.2 % (ref 36.0–46.0)
HEMOGLOBIN: 13.4 g/dL (ref 12.0–15.0)
Hemoglobin: 11.1 g/dL — ABNORMAL LOW (ref 12.0–15.0)
MCH: 24.8 pg — ABNORMAL LOW (ref 26.0–34.0)
MCH: 24.5 pg — ABNORMAL LOW (ref 26.0–34.0)
MCHC: 34.2 g/dL (ref 30.0–36.0)
MCHC: 33.8 g/dL (ref 30.0–36.0)
MCV: 72.6 fL — ABNORMAL LOW (ref 78.0–100.0)
MCV: 72.4 fL — ABNORMAL LOW (ref 78.0–100.0)
PLATELETS: 127 10*3/uL — AB (ref 150–400)
Platelets: 138 10*3/uL — ABNORMAL LOW (ref 150–400)
RBC: 5.4 MIL/uL — ABNORMAL HIGH (ref 3.87–5.11)
RBC: 4.53 MIL/uL (ref 3.87–5.11)
RDW: 14.7 % (ref 11.5–15.5)
RDW: 14.6 % (ref 11.5–15.5)
WBC: 9.8 10*3/uL (ref 4.0–10.5)
WBC: 14.1 10*3/uL — ABNORMAL HIGH (ref 4.0–10.5)

## 2013-09-28 LAB — POSTPARTUM HEMORRHAGE PROTOCOL (BB NOTIFICATION)

## 2013-09-28 LAB — POCT URINALYSIS DIP (DEVICE)
Bilirubin Urine: NEGATIVE
GLUCOSE, UA: NEGATIVE mg/dL
HGB URINE DIPSTICK: NEGATIVE
Ketones, ur: NEGATIVE mg/dL
Leukocytes, UA: NEGATIVE
NITRITE: NEGATIVE
Protein, ur: NEGATIVE mg/dL
Specific Gravity, Urine: 1.015 (ref 1.005–1.030)
Urobilinogen, UA: 0.2 mg/dL (ref 0.0–1.0)
pH: 7 (ref 5.0–8.0)

## 2013-09-28 LAB — RPR: RPR: NONREACTIVE

## 2013-09-28 MED ORDER — ONDANSETRON HCL 4 MG PO TABS
4.0000 mg | ORAL_TABLET | ORAL | Status: DC | PRN
Start: 2013-09-28 — End: 2013-09-30

## 2013-09-28 MED ORDER — OXYTOCIN 40 UNITS IN LACTATED RINGERS INFUSION - SIMPLE MED
INTRAVENOUS | Status: AC
  Filled 2013-09-28: qty 1000

## 2013-09-28 MED ORDER — BENZOCAINE-MENTHOL 20-0.5 % EX AERO
1.0000 "application " | INHALATION_SPRAY | CUTANEOUS | Status: DC | PRN
  Administered 2013-09-28: 1 via TOPICAL
  Filled 2013-09-28: qty 56

## 2013-09-28 MED ORDER — CITRIC ACID-SODIUM CITRATE 334-500 MG/5ML PO SOLN: 30.0000 mL | ORAL | Status: DC | PRN

## 2013-09-28 MED ORDER — CARBOPROST TROMETHAMINE 250 MCG/ML IM SOLN: 250.0000 ug | INTRAMUSCULAR | Status: DC | PRN

## 2013-09-28 MED ORDER — PRENATAL MULTIVITAMIN CH
1.0000 | ORAL_TABLET | Freq: Every day | ORAL | Status: DC
  Administered 2013-09-29: 1 via ORAL
  Filled 2013-09-28 (×2): qty 1

## 2013-09-28 MED ORDER — OXYTOCIN 40 UNITS IN LACTATED RINGERS INFUSION - SIMPLE MED
250.0000 mL/h | INTRAVENOUS | Status: DC
  Administered 2013-09-28: 250 mL/h via INTRAVENOUS

## 2013-09-28 MED ORDER — LANOLIN HYDROUS EX OINT: TOPICAL_OINTMENT | CUTANEOUS | Status: DC | PRN

## 2013-09-28 MED ORDER — LACTATED RINGERS IV SOLN: 500.0000 mL | INTRAVENOUS | Status: DC | PRN

## 2013-09-28 MED ORDER — ACETAMINOPHEN 325 MG PO TABS: 650.0000 mg | ORAL_TABLET | ORAL | Status: DC | PRN

## 2013-09-28 MED ORDER — LACTATED RINGERS IV BOLUS (SEPSIS)
1000.0000 mL | Freq: Once | INTRAVENOUS | Status: AC
  Administered 2013-09-28: 1000 mL via INTRAVENOUS

## 2013-09-28 MED ORDER — ONDANSETRON HCL 4 MG/2ML IJ SOLN: 4.0000 mg | Freq: Four times a day (QID) | INTRAMUSCULAR | Status: DC | PRN

## 2013-09-28 MED ORDER — ZOLPIDEM TARTRATE 5 MG PO TABS: 5.0000 mg | ORAL_TABLET | Freq: Every evening | ORAL | Status: DC | PRN

## 2013-09-28 MED ORDER — OXYTOCIN 40 UNITS IN LACTATED RINGERS INFUSION - SIMPLE MED
62.5000 mL/h | INTRAVENOUS | Status: DC
  Administered 2013-09-28: 999 mL/h via INTRAVENOUS
  Filled 2013-09-28: qty 1000

## 2013-09-28 MED ORDER — SIMETHICONE 80 MG PO CHEW: 80.0000 mg | CHEWABLE_TABLET | ORAL | Status: DC | PRN

## 2013-09-28 MED ORDER — FLEET ENEMA 7-19 GM/118ML RE ENEM: 1.0000 | ENEMA | RECTAL | Status: DC | PRN

## 2013-09-28 MED ORDER — OXYTOCIN BOLUS FROM INFUSION: 500.0000 mL | INTRAVENOUS | Status: DC

## 2013-09-28 MED ORDER — ONDANSETRON HCL 4 MG/2ML IJ SOLN: 4.0000 mg | INTRAMUSCULAR | Status: DC | PRN

## 2013-09-28 MED ORDER — IBUPROFEN 600 MG PO TABS
600.0000 mg | ORAL_TABLET | Freq: Four times a day (QID) | ORAL | Status: DC | PRN
  Administered 2013-09-28: 600 mg via ORAL
  Filled 2013-09-28: qty 1

## 2013-09-28 MED ORDER — MISOPROSTOL 200 MCG PO TABS: 800.0000 ug | ORAL_TABLET | Freq: Once | ORAL | Status: DC

## 2013-09-28 MED ORDER — WITCH HAZEL-GLYCERIN EX PADS: 1.0000 "application " | MEDICATED_PAD | CUTANEOUS | Status: DC | PRN

## 2013-09-28 MED ORDER — DIBUCAINE 1 % RE OINT: 1.0000 "application " | TOPICAL_OINTMENT | RECTAL | Status: DC | PRN

## 2013-09-28 MED ORDER — OXYCODONE-ACETAMINOPHEN 5-325 MG PO TABS
1.0000 | ORAL_TABLET | ORAL | Status: DC | PRN
  Administered 2013-09-28: 1 via ORAL
  Filled 2013-09-28: qty 1

## 2013-09-28 MED ORDER — IBUPROFEN 600 MG PO TABS
600.0000 mg | ORAL_TABLET | Freq: Four times a day (QID) | ORAL | Status: DC
  Administered 2013-09-28 – 2013-09-30 (×8): 600 mg via ORAL
  Filled 2013-09-28 (×8): qty 1

## 2013-09-28 MED ORDER — SODIUM CHLORIDE 0.9 % IV SOLN
2.0000 g | Freq: Once | INTRAVENOUS | Status: AC
  Administered 2013-09-28: 2 g via INTRAVENOUS
  Filled 2013-09-28: qty 2000

## 2013-09-28 MED ORDER — METHYLERGONOVINE MALEATE 0.2 MG/ML IJ SOLN
0.2000 mg | Freq: Once | INTRAMUSCULAR | Status: AC
  Administered 2013-09-28: 0.2 mg via INTRAMUSCULAR

## 2013-09-28 MED ORDER — LACTATED RINGERS IV SOLN
INTRAVENOUS | Status: DC
  Administered 2013-09-28: 125 mL/h via INTRAVENOUS

## 2013-09-28 MED ORDER — LIDOCAINE HCL (PF) 1 % IJ SOLN
30.0000 mL | INTRAMUSCULAR | Status: DC | PRN
  Filled 2013-09-28: qty 30

## 2013-09-28 MED ORDER — SENNOSIDES-DOCUSATE SODIUM 8.6-50 MG PO TABS
2.0000 | ORAL_TABLET | ORAL | Status: DC
  Administered 2013-09-28 – 2013-09-30 (×2): 2 via ORAL
  Filled 2013-09-28 (×2): qty 2

## 2013-09-28 MED ORDER — DIPHENHYDRAMINE HCL 25 MG PO CAPS: 25.0000 mg | ORAL_CAPSULE | Freq: Four times a day (QID) | ORAL | Status: DC | PRN

## 2013-09-28 NOTE — H&P (Signed)
Sabrina Daniels is a 33 y.o. female [phone] with IUP at 5733w0d presenting for active labor. Pt states she has been having irregular contractions, associated with none vaginal bleeding.  Membranes are ruptured, with active fetal movement.    [facility]  Prenatal History/Complications: None  Past Medical History: Past Medical History  Diagnosis Date  . Preterm labor   . Pregnancy induced hypertension     Past Surgical History: Past Surgical History  Procedure Laterality Date  . Middle ear surgery    . Cesarean section      Obstetrical History: OB History   Grav Para Term Preterm Abortions TAB SAB Ect Mult Living   5 3 2 1 1  1   3      Social History: History   Social History  . Marital Status: Single    Spouse Name: N/A    Number of Children: N/A  . Years of Education: N/A   Social History Main Topics  . Smoking status: Never Smoker   . Smokeless tobacco: Never Used  . Alcohol Use: No  . Drug Use: No  . Sexual Activity: Yes    Birth Control/ Protection: None   Other Topics Concern  . None   Social History Narrative  . None    Family History: Family History  Problem Relation Age of Onset  . Stroke Father     Allergies: No Known Allergies  Facility-administered medications prior to admission  Medication Dose Route Frequency Provider Last Rate Last Dose  . Tdap (BOOSTRIX) injection 0.5 mL  0.5 mL Intramuscular Once Vale HavenKeli L Beck, MD       Prescriptions prior to admission  Medication Sig Dispense Refill  . Prenatal Vit-DSS-Fe Fum-FA (SE-NATAL 19) 29-1 MG TABS Take 1 tablet by mouth daily.  30 tablet  10    Review of Systems: Negative unless otherwise stated in History above  Physicial Blood pressure 133/88, pulse 79, temperature 97.5 F (36.4 C), temperature source Oral, resp. rate 18, height 5' (1.524 m), weight 58.968 kg (130 lb), last menstrual period 12/29/2012. General appearance: alert, cooperative and no distress Lungs: clear to  auscultation bilaterally Heart: regular rate and rhythm Abdomen: soft, non-tender; bowel sounds normal Extremities: Homans sign is negative, no sign of DVT Presentation: cephalic Fetal monitoringBaseline: 135 bpm, Variability: Good {> 6 bpm), Accelerations: Reactive and Decelerations: Absent Uterine activityFrequency: Every 10 minutes  Cervix: 8/100/0    Prenatal labs: ABO, Rh: O/POS/-- (08/29 [id number]) Antibody: NEG (08/29 0937) Rubella:   RPR: NON REAC (01/14 1203)  HBsAg: NEGATIVE (08/29 0937)  HIV: NON REACTIVE (01/14 1203)  GBS: Positive (03/04 0000)  GTT: 3hr wnl  Prenatal Transfer Tool  Maternal Diabetes: No Genetic Screening: Declined Maternal Ultrasounds/Referrals: Normal Fetal Ultrasounds or other Referrals:  None Maternal Substance Abuse:  No Significant Maternal Medications:  None Significant Maternal Lab Results: Lab values include: Group B Strep positive  Results for orders placed in visit on 09/28/13 (from the past 24 hour(s))  POCT URINALYSIS DIP (DEVICE)   Collection Time    09/28/13  7:55 AM      Result Value Ref Range   Glucose, UA NEGATIVE  NEGATIVE mg/dL   Bilirubin Urine NEGATIVE  NEGATIVE   Ketones, ur NEGATIVE  NEGATIVE mg/dL   Specific Gravity, Urine 1.015  1.005 - 1.030   Hgb urine dipstick NEGATIVE  NEGATIVE   pH 7.0  5.0 - 8.0   Protein, ur NEGATIVE  NEGATIVE mg/dL   Urobilinogen, UA 0.2  0.0 - 1.0 mg/dL   Nitrite NEGATIVE  NEGATIVE   Leukocytes, UA NEGATIVE  NEGATIVE    Assessment: Sabrina Daniels is a 33 y.o. [id number] at [redacted]w[redacted]d by here for active labor. Hx of HTN and IOL resulting in preterm C-section @ 29 wks for 2nd baby. Has had successful VBAC with last pregnancy  #Labor:progressing normally, expectant management #Pain: Labor support w/o medications #FWB: Cat 1 #ID:  GSB positive; Amp #Feeding: Breast/Bottle #MOC:considering BTL and IUD #Circ:  Female - unsure about circ  Wenda Low MD [facility] PGY-1 09/28/2013, 10:24 AM  I spoke  with and examined patient and agree with resident's note and plan of care.  Tawana Scale, MD OB Fellow 09/28/2013 1:26 PM

## 2013-09-28 NOTE — Patient Instructions (Signed)

## 2013-09-28 NOTE — Progress Notes (Signed)
Pulse

## 2013-09-28 NOTE — MAU Note (Signed)
Sent from clinic because she is dilated 6 cm; G5P3;

## 2013-09-28 NOTE — Lactation Note (Signed)
This note was copied from the chart of Sabrina Morayo Lipinski. Lactation Consultation Note Spouse came back to rm. To be with pt. Speaks some English, but if teaching is to be done, I feel like an interpreter is needed. I asked pt. Spouse what does mom want in feeding the baby. He said she says both, but she doesn't have milk now so she will do bottle and then when milk comes she will do breast and bottle. Asked if mom breast fed other children, he said yes 3-4 months. I explained that we should try to breast feed now as well to encourage lots of milk to come in. States that she will but not tonight she doesn't feel well. I also asked him was she to hot because she was sweating, he said no she thinks sweating is good for her and she doesn't want to be cold. She likes being hot. Encouraged to call for assistance with feedings. Will report LC to follow up when interpreter available. Patient Name: Sabrina Daniels Today's Date: 09/28/2013     Maternal Data    Feeding Feeding Type: Bottle Fed - Formula Nipple Type: Slow - flow  LATCH Score/Interventions                      Lactation Tools Discussed/Used     Consult Status      Charyl DancerCARVER, Mario Voong G 09/28/2013, 10:12 PM

## 2013-09-28 NOTE — Progress Notes (Signed)
Upon the 1 hour out check at 1430, the patient stated she had to urinate. I proceeded to take her to the bathroom to void. When we got her  to the toliet, she said she felt dizzy and then jerked her head back hard and hit it on the back of the toliet. It seemed to be somewhat of a short 20 second seizure. When the patient jerked her head back, her eyes rolled back into her head, and her left wrist\arm was in  A  flexed position for 5 seconds. Her color during that episode was a greenish gray color. She had passed about 350cc worth of blood clots during that event on the toliet. The patient did not fall, this event took place on the toliet. Myself and two other RNs tried to assist to get her into the wheelchair in which she passed out for the 2nd time. The emergency bell was pulled and I called out for them to call the MD\and hemmorhage protocol was initiated. All required personal was accounted for during the stage 1 post partum hemmorhage.The patient had an  EBL of 350 and had 350cc worth of blood clots out totaling 700cc. The patient was placed in the bed, and examined by Dr. Ike Benedom . IV bolus of one bag of LR was started as well as a bolus of 500cc of LR with 40 of Pitocin. IM methergine was given per MD order. The patient's was straight cathed for about 50cc..  The patient's color was pink and the patient became stable. Blood pressures all ranging in the 100's\60's ) pulse ox saturations were 97 to 99% on Room Air. Will monitor patient. Dr. Ike Benedom and Dr. Debroah LoopArnold were notified of patient hitting her head and having a possible seizure. Will continue to monitor patient.

## 2013-09-28 NOTE — Progress Notes (Signed)
Doing well. States has been cramping some. Strong UC palpated during exam. Cervix with advanced dilation and bulging membranes. Has had a vaginal delivery followed by a C/S, followed by a VBAC.  Wants TOLAC again.  Will send to [facility] for labor eval and possible admit.

## 2013-09-29 LAB — CBC WITH DIFFERENTIAL/PLATELET
BASOS PCT: 0 % (ref 0–1)
Basophils Absolute: 0 10*3/uL (ref 0.0–0.1)
Eosinophils Absolute: 0.1 10*3/uL (ref 0.0–0.7)
Eosinophils Relative: 1 % (ref 0–5)
HCT: 27 % — ABNORMAL LOW (ref 36.0–46.0)
Hemoglobin: 8.9 g/dL — ABNORMAL LOW (ref 12.0–15.0)
LYMPHS PCT: 30 % (ref 12–46)
Lymphs Abs: 2.3 10*3/uL (ref 0.7–4.0)
MCH: 24.1 pg — ABNORMAL LOW (ref 26.0–34.0)
MCHC: 33 g/dL (ref 30.0–36.0)
MCV: 73 fL — ABNORMAL LOW (ref 78.0–100.0)
Monocytes Absolute: 0.5 10*3/uL (ref 0.1–1.0)
Monocytes Relative: 7 % (ref 3–12)
NEUTROS ABS: 4.7 10*3/uL (ref 1.7–7.7)
NEUTROS PCT: 62 % (ref 43–77)
Platelets: 124 10*3/uL — ABNORMAL LOW (ref 150–400)
RBC: 3.7 MIL/uL — ABNORMAL LOW (ref 3.87–5.11)
RDW: 14.5 % (ref 11.5–15.5)
WBC: 7.6 10*3/uL (ref 4.0–10.5)

## 2013-09-29 MED ORDER — FERROUS SULFATE 325 (65 FE) MG PO TABS
325.0000 mg | ORAL_TABLET | Freq: Every day | ORAL | Status: DC
  Administered 2013-09-29 – 2013-09-30 (×2): 325 mg via ORAL
  Filled 2013-09-29 (×2): qty 1

## 2013-09-29 NOTE — Progress Notes (Addendum)
Post Partum Day 1 Subjective: Does feel "well", but no specific complaints. Denies lightheadedness, dizziness, SOB or fevers/chills. Ambulating now without difficulty. Tolerating PO intake and voiding well; No BM or flatus.   Objective: Blood pressure 89/59, pulse 98, temperature 97.8 F (36.6 C), temperature source Oral, resp. rate 20, height 5' (1.524 m), weight 58.968 kg (130 lb), last menstrual period 12/29/2012, SpO2 98.00%, unknown if currently breastfeeding.  Physical Exam:  General: alert, cooperative and pale Lochia: appropriate Uterine Fundus: firm Incision: na DVT Evaluation: No evidence of DVT seen on physical exam. Negative Homan's sign. No cords or calf tenderness. No significant calf/ankle edema.   Recent Labs  09/28/13 1447 09/29/13 0622  HGB 11.1* 8.9*  HCT 32.8* 27.0*    Assessment/Plan: Plan for discharge tomorrow, Lactation consult and Contraception Undecided  PPH - bleeding much improved - Continue iron and PNV    LOS: 1 day   Wenda LowJoyner, James 09/29/2013, 7:29 AM   I have seen and examined this patient and I agree with the above. SHAW, KIMBERLY 9:00 AM 09/29/2013

## 2013-09-29 NOTE — Progress Notes (Signed)
UR chart review completed.  

## 2013-09-29 NOTE — Lactation Note (Signed)
This note was copied from the chart of Sabrina Konni Pooler. Lactation Consultation Note  Patient Name: Sabrina Daniels Today's Date: 09/29/2013 Reason for consult: Follow-up assessment [facility] [employer] [facility] but none was available. Mom understands some English. Mom has not been putting baby to breast, reports "No milk". Demonstrated hand expression, few drops of colostrum obtained. Assisted Mom to latch baby on right breast in football hold. Baby latched easily demonstrating a good rhythmic suck with few swallows noted. Assisted Mom in cross cradle and cradle hold on left breast. Advised Mom to BF before giving any bottles to help her milk come in well, demonstrated how to bring bottom lip down. Mom denies any discomfort. Mom was able to use teach back to help [facility] know she was understanding. Advised to ask for assist as needed.   Maternal Data Infant to breast within first hour of birth: No Breastfeeding delayed due to:: Infant status;Other (comment) (Mom reports baby was sleepy) Has patient been taught Hand Expression?: Yes Does the patient have breastfeeding experience prior to this delivery?: Yes  Feeding Feeding Type: Breast Fed Length of feed: 15 min  LATCH Score/Interventions Latch: Grasps breast easily, tongue down, lips flanged, rhythmical sucking.  Audible Swallowing: A few with stimulation  Type of Nipple: Everted at rest and after stimulation  Comfort (Breast/Nipple): Soft / non-tender     Hold (Positioning): Assistance needed to correctly position infant at breast and maintain latch. Intervention(s): Breastfeeding basics reviewed;Support Pillows;Position options;Skin to skin  LATCH Score: 8  Lactation Tools Discussed/Used WIC Program: Yes   Consult Status Consult Status: Follow-up Date: 09/30/13 Follow-up type: In-patient    Alfred LevinsGranger, Raneshia Derick Ann 09/29/2013, 4:52 PM

## 2013-09-30 MED ORDER — FERROUS SULFATE 325 (65 FE) MG PO TABS: 325.0000 mg | ORAL_TABLET | Freq: Every day | ORAL | Status: DC

## 2013-09-30 MED ORDER — IBUPROFEN 600 MG PO TABS: 600.0000 mg | ORAL_TABLET | Freq: Four times a day (QID) | ORAL | Status: DC

## 2013-09-30 NOTE — Discharge Summary (Signed)
Obstetric Discharge Summary Reason for Admission: onset of labor Prenatal Procedures: none Intrapartum Procedures: spontaneous vaginal delivery and GBS prophylaxis Postpartum Procedures: none Complications-Operative and Postpartum: hemorrhage Hemoglobin  Date Value Ref Range Status  09/29/2013 8.9* 12.0 - 15.0 g/dL Final     REPEATED TO VERIFY     DELTA CHECK NOTED     HCT  Date Value Ref Range Status  09/29/2013 27.0* 36.0 - 46.0 % Final    Discharge Diagnoses: Term Pregnancy-delivered  Hospital Course:  Sabrina Daniels is a 33 y.o. [id number] who presented with active labor.  She had a uncomplicated SVD with lacerations. On PPD#0 she passed a ~ 800 blood clots vaginally while urinating and had a syncopal episode. Code hemorrhage was called, her vital signs were stable and she was not bleeding. She was I&O cathed and given methergine and second dose of Pitocin. The next day her bleeding was minimal and she had not passed any additional clots.  She was able to ambulate, tolerate PO and void normally. She was discharged home with instructions for postpartum care.    Delivery Note  At 11:53 AM a healthy female was delivered via Vaginal, Spontaneous Delivery (Presentation: ; Occiput Anterior). APGAR: 7, 9; weight 7 lb 3.3 oz (3270 g).  Placenta status: Intact, Spontaneous. Cord: 3 vessels with the following complications: None. Cord pH: NA  Anesthesia: None  Episiotomy: None  Lacerations: None  Suture Repair: NA  Est. Blood Loss (250mL):  Upon arrival she had an anterior and right cervical rim, but was actively pushing. She pushed with good maternal effort to deliver a healthy boy. There was a loose nuchal cord x 1 that was reduced post delivery. Baby delivered without difficulty and place on maternal abdomen. Delayed cord clamping performed and cut by MD. Placenta delivered intact with 3V cord. Mom to postpartum and baby to skin to skin. Counts were correct.  Mom to postpartum. Baby to Couplet care /  Skin to Skin.  Wenda LowJoyner, James  09/28/2013, 12:12 PM  I was present and agree with resident's note and plan of care.  Tawana ScaleMichael Ryan Odom, MD OB Fellow  09/28/2013  1:26 PM  Physical Exam:  General: alert, cooperative and no distress Lochia: appropriate Uterine Fundus: firm DVT Evaluation: No evidence of DVT seen on physical exam. Negative Homan's sign. No cords or calf tenderness. No significant calf/ankle edema.  Discharge Information: Date: 09/30/2013 Activity: pelvic rest Diet: routine Medications: Ibuprofen Baby feeding: plans to breastfeed Contraception: IUD  Mirena Condition: stable Instructions: refer to practice specific booklet Discharge to: home   Newborn Data: Live born female  Birth Weight: 7 lb 3.3 oz (3270 g) APGAR: 7, 9  Home with mother.  Wenda LowJames Joyner, MD [facility] PGY-1 09/30/2013, 10:47 AM  I have seen and examined this patient and agree with above documentation in the resident's note.   Rulon AbideKeli Amellia Panik, M.D. [facility] 09/30/2013 11:32 AM

## 2013-09-30 NOTE — Plan of Care (Signed)
Problem: Discharge Progression Outcomes Goal: Barriers To Progression Addressed/Resolved Outcome: Completed/Met Date Met:  09/30/13 [facility] interpretor came to hospital to

## 2013-09-30 NOTE — Discharge Instructions (Signed)

## 2013-10-02 LAB — TYPE AND SCREEN
ABO/RH(D): O POS
Antibody Screen: NEGATIVE
Unit division: 0
Unit division: 0

## 2013-10-06 NOTE — Discharge Summary (Signed)
Attestation of Attending Supervision of Fellow: Evaluation and management procedures were performed by the Fellow under my supervision and collaboration.  I have reviewed the Fellow's note and chart, and I agree with the management and plan.    

## 2013-11-04 ENCOUNTER — Ambulatory Visit (INDEPENDENT_AMBULATORY_CARE_PROVIDER_SITE_OTHER): Payer: Medicaid Other | Admitting: Advanced Practice Midwife

## 2013-11-04 ENCOUNTER — Encounter: Payer: Self-pay | Admitting: Advanced Practice Midwife

## 2013-11-04 VITALS — BP 114/81 | HR 86 | Temp 98.0°F | Wt 113.8 lb

## 2013-11-04 DIAGNOSIS — Z30017 Encounter for initial prescription of implantable subdermal contraceptive: Secondary | ICD-10-CM

## 2013-11-04 DIAGNOSIS — Z3049 Encounter for surveillance of other contraceptives: Secondary | ICD-10-CM

## 2013-11-04 LAB — POCT PREGNANCY, URINE: Preg Test, Ur: NEGATIVE

## 2013-11-04 MED ORDER — ETONOGESTREL 68 MG ~~LOC~~ IMPL
68.0000 mg | DRUG_IMPLANT | Freq: Once | SUBCUTANEOUS | Status: AC
  Administered 2013-11-04: 68 mg via SUBCUTANEOUS

## 2013-11-04 NOTE — Addendum Note (Signed)
Addended by: Faythe CasaBELLAMY, Daevon Holdren M on: 11/04/2013 10:38 AM   Modules accepted: Orders

## 2013-11-04 NOTE — Patient Instructions (Signed)
Etonogestrel implant What is this medicine? ETONOGESTREL (et oh noe JES trel) is a contraceptive (birth control) device. It is used to prevent pregnancy. It can be used for up to 3 years. This medicine may be used for other purposes; ask your health care provider or pharmacist if you have questions. COMMON BRAND NAME(S): Implanon, Nexplanon  What should I tell my health care provider before I take this medicine? They need to know if you have any of these conditions: -abnormal vaginal bleeding -blood vessel disease or blood clots -cancer of the breast, cervix, or liver -depression -diabetes -gallbladder disease -headaches -heart disease or recent heart attack -high blood pressure -high cholesterol -kidney disease -liver disease -renal disease -seizures -tobacco smoker -an unusual or allergic reaction to etonogestrel, other hormones, anesthetics or antiseptics, medicines, foods, dyes, or preservatives -pregnant or trying to get pregnant -breast-feeding How should I use this medicine? This device is inserted just under the skin on the inner side of your upper arm by a health care professional. Talk to your pediatrician regarding the use of this medicine in children. Special care may be needed. Overdosage: If you think you've taken too much of this medicine contact a poison control center or emergency room at once. Overdosage: If you think you have taken too much of this medicine contact a poison control center or emergency room at once. NOTE: This medicine is only for you. Do not share this medicine with others. What if I miss a dose? This does not apply. What may interact with this medicine? Do not take this medicine with any of the following medications: -amprenavir -bosentan -fosamprenavir This medicine may also interact with the following medications: -barbiturate medicines for inducing sleep or treating seizures -certain medicines for fungal infections like ketoconazole and  itraconazole -griseofulvin -medicines to treat seizures like carbamazepine, felbamate, oxcarbazepine, phenytoin, topiramate -modafinil -phenylbutazone -rifampin -some medicines to treat HIV infection like atazanavir, indinavir, lopinavir, nelfinavir, tipranavir, ritonavir -St. John's wort This list may not describe all possible interactions. Give your health care provider a list of all the medicines, herbs, non-prescription drugs, or dietary supplements you use. Also tell them if you smoke, drink alcohol, or use illegal drugs. Some items may interact with your medicine. What should I watch for while using this medicine? This product does not protect you against HIV infection (AIDS) or other sexually transmitted diseases. You should be able to feel the implant by pressing your fingertips over the skin where it was inserted. Tell your doctor if you cannot feel the implant. What side effects may I notice from receiving this medicine? Side effects that you should report to your doctor or health care professional as soon as possible: -allergic reactions like skin rash, itching or hives, swelling of the face, lips, or tongue -breast lumps -changes in vision -confusion, trouble speaking or understanding -dark urine -depressed mood -general ill feeling or flu-like symptoms -light-colored stools -loss of appetite, nausea -right upper belly pain -severe headaches -severe pain, swelling, or tenderness in the abdomen -shortness of breath, chest pain, swelling in a leg -signs of pregnancy -sudden numbness or weakness of the face, arm or leg -trouble walking, dizziness, loss of balance or coordination -unusual vaginal bleeding, discharge -unusually weak or tired -yellowing of the eyes or skin Side effects that usually do not require medical attention (Report these to your doctor or health care professional if they continue or are bothersome.): -acne -breast pain -changes in  weight -cough -fever or chills -headache -irregular menstrual bleeding -itching, burning,   and vaginal discharge -pain or difficulty passing urine -sore throat This list may not describe all possible side effects. Call your doctor for medical advice about side effects. You may report side effects to FDA at 1-800-FDA-1088. Where should I keep my medicine? This drug is given in a hospital or clinic and will not be stored at home. NOTE: This sheet is a summary. It may not cover all possible information. If you have questions about this medicine, talk to your doctor, pharmacist, or health care provider.  2014, Elsevier/Gold Standard. (2012-01-05 15:37:45)  

## 2013-11-04 NOTE — Progress Notes (Signed)
Patient ID: Sabrina Daniels, female   DOB: 04/08/1981, 33 y.o.   MRN: [id number]  GYNECOLOGY CLINIC PROCEDURE NOTE  Sabrina Daniels is a 33 y.o. [id number] here for postpartum visit, and desires Nexplanon insertion.   Nexplanon Insertion Procedure Patient was given informed consent, she signed consent form.  Patient does understand that irregular bleeding is a very common side effect of this medication. She was advised to have backup contraception for one week after placement. Appropriate time out taken.  Patient's left arm was prepped and draped in the usual sterile fashion.. The ruler used to measure and mark insertion area.  Patient was prepped with alcohol swab and then injected with 1 ml of 1% lidocaine.  She was prepped with betadine, Nexplanon removed from packaging,  Device confirmed in needle, then inserted full length of needle and withdrawn per handbook instructions. Nexplanon was able to palpated in the patient's arm; patient palpated the insert herself. There was minimal blood loss.  Patient insertion site covered with gauze and a pressure bandage to reduce any bruising.  The patient tolerated the procedure well and was given post procedure instructions. The procedure was supervised in its entirety by attending physician Dr. Lovett SoxAnywanwu.  Sabrina DodrillBrittany J Synai Prettyman, MD Family Medicine PGY-2

## 2013-11-04 NOTE — Progress Notes (Signed)
Patient ID: Sabrina Daniels, female   DOB: 08/02/1980, 33 y.o.   MRN: [id number] S: [name] is a 33 y.o. [id number] who is here for PP check-up. She denies any mood changes. She states that her bleeding has stopped. She is breastfeeding, and that is going well. She is unsure about birth control. She was considering Mirena V Nexplanon  O: VSS afebrile Affect normal Abdomen: Soft, NT  A/P: Postpartum examination and care desires contraception Discussed Mirena V Nexplanon at length R/B of both reviewed Discussed bleeding profile of Nexplanon  D/W Dr. Macon LargeAnyanwu she is available to place Nexplanon.

## 2013-11-04 NOTE — Progress Notes (Deleted)
Patient ID: Sabrina Daniels, female   DOB: 04/29/1981, 33 y.o.   MRN: [id number]

## 2013-11-07 ENCOUNTER — Encounter: Payer: Self-pay | Admitting: *Deleted

## 2013-11-09 ENCOUNTER — Ambulatory Visit: Payer: Medicaid Other | Admitting: Obstetrics & Gynecology

## 2013-12-22 ENCOUNTER — Encounter: Payer: Self-pay | Admitting: Obstetrics & Gynecology

## 2013-12-22 ENCOUNTER — Ambulatory Visit (INDEPENDENT_AMBULATORY_CARE_PROVIDER_SITE_OTHER): Payer: Self-pay | Admitting: Obstetrics & Gynecology

## 2013-12-22 VITALS — BP 117/79 | HR 72 | Ht 60.5 in | Wt 108.8 lb

## 2013-12-22 DIAGNOSIS — Z975 Presence of (intrauterine) contraceptive device: Secondary | ICD-10-CM

## 2013-12-22 NOTE — Progress Notes (Signed)
Patient reports that she has memory loss, headaches which she relates to the iud. She would like to have it removed. She is self pay so advised that other options can be expensive.

## 2013-12-22 NOTE — Progress Notes (Signed)
    GYNECOLOGY CLINIC NOTE  Sabrina Daniels is a 33 y.o. [id number] here for Nexplanon removal. [place] interpreter present. Placed 3 months ago. Feels it is causing her headaches, memory loss, other problems. Wants pills instead.  Was told that her symptoms may not be caused by Nexplanon, she still wants it out.  GYN concerns. Patient unwilling to have removal here secondary to cost. Will have this done at [facility], will get Rx for pills there.      Jaynie Collins, MD, FACOG Attending Obstetrician & Gynecologist Faculty Practice, [facility]

## 2013-12-22 NOTE — Patient Instructions (Signed)
Return to clinic for any scheduled appointments or for any gynecologic concerns as needed.   

## 2014-01-23 ENCOUNTER — Emergency Department (HOSPITAL_COMMUNITY): Payer: Medicaid Other

## 2014-01-23 ENCOUNTER — Emergency Department (HOSPITAL_COMMUNITY)
Admission: EM | Admit: 2014-01-23 | Discharge: 2014-01-23 | Disposition: A | Discharge status: Home / Self Care | Payer: Medicaid Other | Attending: Emergency Medicine | Admitting: Emergency Medicine

## 2014-01-23 ENCOUNTER — Telehealth (HOSPITAL_COMMUNITY): Payer: Self-pay

## 2014-01-23 ENCOUNTER — Encounter (HOSPITAL_COMMUNITY): Payer: Self-pay | Admitting: Emergency Medicine

## 2014-01-23 DIAGNOSIS — R51 Headache: Secondary | ICD-10-CM | POA: Insufficient documentation

## 2014-01-23 DIAGNOSIS — Z8679 Personal history of other diseases of the circulatory system: Secondary | ICD-10-CM | POA: Insufficient documentation

## 2014-01-23 DIAGNOSIS — R519 Headache, unspecified: Secondary | ICD-10-CM

## 2014-01-23 MED ORDER — KETOROLAC TROMETHAMINE 60 MG/2ML IM SOLN
60.0000 mg | Freq: Once | INTRAMUSCULAR | Status: AC
  Administered 2014-01-23: 60 mg via INTRAMUSCULAR
  Filled 2014-01-23: qty 2

## 2014-01-23 MED ORDER — PROCHLORPERAZINE EDISYLATE 5 MG/ML IJ SOLN
10.0000 mg | Freq: Once | INTRAMUSCULAR | Status: AC
  Administered 2014-01-23: 10 mg via INTRAMUSCULAR
  Filled 2014-01-23: qty 2

## 2014-01-23 NOTE — Discharge Instructions (Signed)
May take excedrin migraine if needed for recurrent headache. Follow up with a primary care physician in the area.  Resource guide attached to help with this. Return to the ED for new concerns.

## 2014-01-23 NOTE — ED Notes (Signed)
Contacted [employer] language interpreters, attempting to locate interpreter

## 2014-01-23 NOTE — ED Notes (Addendum)
Note: Pt reports progressively worsening HA x 4 days with intermittent blurred vision and nausea. States she took Tylenol with no relief. States "I feel tired, I couldn't sleep last night because it hurt so bad."  Denies abdominal pain. Neuro intact. Pt speaks [place].

## 2014-01-23 NOTE — ED Notes (Signed)
Unable to locate interpreter via [employer] and [employer]

## 2014-01-23 NOTE — ED Provider Notes (Signed)
CSN: [id number]     Arrival date & time 01/23/14  1105 History   First MD Initiated Contact with Patient 01/23/14 1250     Chief Complaint  Patient presents with  . Headache     (Consider location/radiation/quality/duration/timing/severity/associated sxs/prior Treatment) The history is provided by the patient and medical records.   HISTORY EXTREMELY LIMITED DUE TO LANGUAGE BARRIER.  PATIENT'S LANGUAGE (JARAI) WHICH IS NOT AVAILABLE VIA [employer]. This is a 33 y.o. F with no significant PMH presenting to the ED for headache.  Pt states headache has been intermittent over the past 3-4 days.  States she's been having intermittent photophobia and some nausea. No vomiting.  States she has taken Tylenol without relief. States pain was so intense last night that she was having difficulty sleeping. Patient has no history of migraines or intracranial abnormalities. No known head trauma. She denies any dizziness, lightheadedness, tinnitus, changes in speech, unilateral weakness, or difficulty ambulating.  No fever or chills. No neck pain.  VS stable on arrival.  Past Medical History  Diagnosis Date  . Preterm labor   . Pregnancy induced hypertension    Past Surgical History  Procedure Laterality Date  . Middle ear surgery    . Cesarean section     Family History  Problem Relation Age of Onset  . Stroke Father    History  Substance Use Topics  . Smoking status: Never Smoker   . Smokeless tobacco: Never Used  . Alcohol Use: No   OB History   Grav Para Term Preterm Abortions TAB SAB Ect Mult Living   5 4 3 1 1  1   4      Review of Systems  Unable to perform ROS: Other  LANGUAGE BARRIER    Allergies  Review of patient's allergies indicates no known allergies.  Home Medications   Prior to Admission medications   Medication Sig Start Date End Date Taking? Authorizing Provider  acetaminophen (TYLENOL) 500 MG tablet Take 1,000 mg by mouth every 6 (six) hours as needed  for headache.   Yes Historical Provider, MD  Aspirin-Salicylamide-Caffeine (BC HEADACHE POWDER PO) Take 1 packet by mouth daily as needed (for pain).   Yes Historical Provider, MD   BP 127/88  Pulse 65  Temp(Src) 97.8 F (36.6 C) (Oral)  Resp 18  SpO2 100%  Physical Exam  Nursing note and vitals reviewed. Constitutional: She is oriented to person, place, and time. She appears well-developed and well-nourished.  Comfortably lying in bed, playing with baby and 2 small toddlers  HENT:  Head: Normocephalic and atraumatic.  Mouth/Throat: Oropharynx is clear and moist.  Eyes: Conjunctivae and EOM are normal. Pupils are equal, round, and reactive to light.  Neck: Normal range of motion and full passive range of motion without pain. Neck supple. No rigidity.  No meningeal signs  Cardiovascular: Normal rate, regular rhythm and normal heart sounds.   Pulmonary/Chest: Effort normal and breath sounds normal.  Abdominal: Soft. Bowel sounds are normal. There is no tenderness. There is no guarding.  Musculoskeletal: Normal range of motion. She exhibits no edema.  Neurological: She is alert and oriented to person, place, and time. She has normal strength. She displays no tremor. No cranial nerve deficit or sensory deficit. She displays no seizure activity.  AAOx3, answering questions appropriately; equal strength UE and LE bilaterally; CN grossly intact; moves all extremities appropriately without ataxia; no focal neuro deficits or facial asymmetry appreciated  Skin: Skin is warm and dry.  Psychiatric:  She has a normal mood and affect.    ED Course  Procedures (including critical care time) Labs Review Labs Reviewed - No data to display  Imaging Review Ct Head Wo Contrast  01/23/2014   CLINICAL DATA:  Progressively worsening headache for 4 days, intermittent blurred vision and nausea  EXAM: CT HEAD WITHOUT CONTRAST  TECHNIQUE: Contiguous axial images were obtained from the base of the skull  through the vertex without intravenous contrast.  COMPARISON:  None  FINDINGS: Normal ventricular morphology.  No midline shift or mass effect.  Normal appearance of brain parenchyma.  No intracranial hemorrhage, mass lesion, or acute infarction.  Visualized paranasal sinuses and mastoid air cells clear.  Bones unremarkable.  IMPRESSION: Normal exam.   Electronically Signed   By: Ulyses Southward M.D.   On: 01/23/2014 14:16     EKG Interpretation None      MDM   Final diagnoses:  Headache, unspecified headache type   33 y.o. F with intermittent headache over past several days.  History very difficult to obtain due to the language barrier and patient's primary language not available via [employer].  On exam patient is afebrile and nontoxic appearing.  She is neurologically intact without any meningeal signs. She is currently lying in bed playing with her 80-month-old son and 2 small daughters.  Due to language barrier and history, will obtain CT head to r/o any intracranial pathology.  Head CT negative.  Toradol and compazine given for headache with improvement of sx.  Pt remains neurologically intact.  No fever or meningeal signs to suggest meningitis.  She has ambulated around the ED with her children, no noted ataxia or instability.  At this time I have a suspicion for TIA, stroke, SAH, ICH, or meningitis.  Feel she can be discharged with close followup. I've given her a resource guide and encouraged her to find a primary care physician in the area. Encouraged excedrin migraine if needed for recurrent headaches.  Discussed plan with patient, he/she acknowledged understanding and agreed with plan of care.  Return precautions given for new or worsening symptoms.  Garlon Hatchet, PA-C 01/23/14 [phone]

## 2014-01-31 NOTE — ED Provider Notes (Signed)
Medical screening examination/treatment/procedure(s) were performed by non-physician practitioner and as supervising physician I was immediately available for consultation/collaboration.   EKG Interpretation None        Osmel Dykstra, MD 01/31/14 0709 

## 2014-03-26 ENCOUNTER — Emergency Department (HOSPITAL_COMMUNITY)
Admission: EM | Admit: 2014-03-26 | Discharge: 2014-03-26 | Disposition: A | Discharge status: Home / Self Care | Payer: No Typology Code available for payment source | Attending: Emergency Medicine | Admitting: Emergency Medicine

## 2014-03-26 ENCOUNTER — Emergency Department (HOSPITAL_COMMUNITY): Payer: No Typology Code available for payment source

## 2014-03-26 ENCOUNTER — Encounter (HOSPITAL_COMMUNITY): Payer: Self-pay | Admitting: Emergency Medicine

## 2014-03-26 DIAGNOSIS — Z8679 Personal history of other diseases of the circulatory system: Secondary | ICD-10-CM | POA: Insufficient documentation

## 2014-03-26 DIAGNOSIS — G8921 Chronic pain due to trauma: Secondary | ICD-10-CM | POA: Insufficient documentation

## 2014-03-26 DIAGNOSIS — M6740 Ganglion, unspecified site: Secondary | ICD-10-CM | POA: Diagnosis not present

## 2014-03-26 DIAGNOSIS — Z8751 Personal history of pre-term labor: Secondary | ICD-10-CM | POA: Insufficient documentation

## 2014-03-26 DIAGNOSIS — R071 Chest pain on breathing: Secondary | ICD-10-CM | POA: Insufficient documentation

## 2014-03-26 DIAGNOSIS — M25532 Pain in left wrist: Secondary | ICD-10-CM

## 2014-03-26 DIAGNOSIS — M25539 Pain in unspecified wrist: Secondary | ICD-10-CM | POA: Diagnosis not present

## 2014-03-26 DIAGNOSIS — M67432 Ganglion, left wrist: Secondary | ICD-10-CM

## 2014-03-26 NOTE — ED Provider Notes (Signed)
Medical screening examination/treatment/procedure(s) were performed by non-physician practitioner and as supervising physician I was immediately available for consultation/collaboration.   EKG Interpretation None        Elwin Mocha, MD 03/26/14 2320

## 2014-03-26 NOTE — ED Notes (Signed)
Greta Doom, PA gave discharge instructions to pt and pts husband.

## 2014-03-26 NOTE — ED Notes (Signed)
The pt is c/o chest sioreness and lt wrist pain since she was in [facility] in march this year.  She could not be xrayed then because she was preg and delivered march 18th.  Her chest soreness has continued and she as a   Small nodule lt dorsal hand that has been there and has nit gone away
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
## 2014-05-15 ENCOUNTER — Encounter (HOSPITAL_COMMUNITY): Payer: Self-pay | Admitting: Emergency Medicine

## 2014-06-16 ENCOUNTER — Ambulatory Visit: Payer: Medicaid Other | Admitting: Obstetrics & Gynecology

## 2014-08-25 ENCOUNTER — Ambulatory Visit: Payer: Medicaid Other | Admitting: Obstetrics and Gynecology

## 2014-08-28 NOTE — Progress Notes (Signed)
Patient ID: Sabrina Daniels, female   DOB: 02/02/1981, 34 y.o.   MRN: [id number] Patient was not seen by a provider

## 2014-09-08 ENCOUNTER — Ambulatory Visit (INDEPENDENT_AMBULATORY_CARE_PROVIDER_SITE_OTHER): Payer: Self-pay | Admitting: Advanced Practice Midwife

## 2014-09-08 ENCOUNTER — Other Ambulatory Visit (HOSPITAL_COMMUNITY)
Admission: RE | Admit: 2014-09-08 | Discharge: 2014-09-08 | Disposition: A | Discharge status: Home / Self Care | Payer: Medicaid Other | Source: Ambulatory Visit | Attending: Advanced Practice Midwife | Admitting: Advanced Practice Midwife

## 2014-09-08 ENCOUNTER — Encounter: Payer: Self-pay | Admitting: Advanced Practice Midwife

## 2014-09-08 VITALS — BP 100/65 | HR 74 | Temp 97.6°F | Ht 60.0 in | Wt 112.0 lb

## 2014-09-08 DIAGNOSIS — Z3202 Encounter for pregnancy test, result negative: Secondary | ICD-10-CM

## 2014-09-08 DIAGNOSIS — Z3200 Encounter for pregnancy test, result unknown: Secondary | ICD-10-CM

## 2014-09-08 DIAGNOSIS — R110 Nausea: Secondary | ICD-10-CM

## 2014-09-08 DIAGNOSIS — Z3049 Encounter for surveillance of other contraceptives: Secondary | ICD-10-CM

## 2014-09-08 DIAGNOSIS — Z23 Encounter for immunization: Secondary | ICD-10-CM

## 2014-09-08 DIAGNOSIS — Z1151 Encounter for screening for human papillomavirus (HPV): Secondary | ICD-10-CM | POA: Insufficient documentation

## 2014-09-08 DIAGNOSIS — Z01419 Encounter for gynecological examination (general) (routine) without abnormal findings: Secondary | ICD-10-CM | POA: Insufficient documentation

## 2014-09-08 DIAGNOSIS — Z113 Encounter for screening for infections with a predominantly sexual mode of transmission: Secondary | ICD-10-CM | POA: Insufficient documentation

## 2014-09-08 LAB — POCT PREGNANCY, URINE: PREG TEST UR: NEGATIVE

## 2014-09-08 MED ORDER — ONDANSETRON 4 MG PO TBDP: 4.0000 mg | ORAL_TABLET | Freq: Four times a day (QID) | ORAL | Status: DC | PRN

## 2014-09-08 NOTE — Progress Notes (Signed)
   Subjective:    Patient ID: Sabrina Daniels, female    DOB: 01/11/1981, 34 y.o.   MRN: [id number]  HPI: Here to discuss possible side effect from Nexplanon. Inserted 10/2013. Having daily nausea since insertion. Desires something that she doesn't have to try to remember every day. Last Pap 03/2013, normal. No Hx abnormal Pap.    Hx obtained w/ assistance of BS interpreter.   Review of Systems. Pos for nausea. Neg for fever, chills, diarrhea, sick contacts.       Objective:   Physical Exam  NAD. Mucus membranes moist.   UPT neg     Assessment & Plan:  Nausea w/ Nexplanon.  Plan: Discussed other contraceptive options that are highly effective and long-acting including Mirena, Paragard, Depo.  Also offered antiemetic to use w/ Nexplanon. Pt would like Antiemetic and will continue w/. Nexplanon for now.   InglisVirginia Leighton Brickley, CNM 09/08/2014 1:02 PM

## 2014-09-08 NOTE — Progress Notes (Signed)
Here for annual exam. Called [employer] - no Estanislado SpireJarai interpreter available. Awaiting interpreter from Language [employer]. Used interpreter H'Lus Ksor. Wanted birth  Control - but per chart review had nexplanon inserted 10/2013. States she feels nausea/ morning sickness like when she was pregnant. Urine obtained for urine pregnancy test.

## 2014-09-08 NOTE — Patient Instructions (Signed)
Levonorgestrel intrauterine device (IUD) What is this medicine? LEVONORGESTREL IUD (LEE voe nor jes trel) is a contraceptive (birth control) device. The device is placed inside the uterus by a healthcare professional. It is used to prevent pregnancy and can also be used to treat heavy bleeding that occurs during your period. Depending on the device, it can be used for 3 to 5 years. This medicine may be used for other purposes; ask your health care provider or pharmacist if you have questions. COMMON BRAND NAME(S): LILETTA, Mirena, Skyla What should I tell my health care provider before I take this medicine? They need to know if you have any of these conditions: -abnormal Pap smear -cancer of the breast, uterus, or cervix -diabetes -endometritis -genital or pelvic infection now or in the past -have more than one sexual partner or your partner has more than one partner -heart disease -history of an ectopic or tubal pregnancy -immune system problems -IUD in place -liver disease or tumor -problems with blood clots or take blood-thinners -use intravenous drugs -uterus of unusual shape -vaginal bleeding that has not been explained -an unusual or allergic reaction to levonorgestrel, other hormones, silicone, or polyethylene, medicines, foods, dyes, or preservatives -pregnant or trying to get pregnant -breast-feeding How should I use this medicine? This device is placed inside the uterus by a health care professional. Talk to your pediatrician regarding the use of this medicine in children. Special care may be needed. Overdosage: If you think you have taken too much of this medicine contact a poison control center or emergency room at once. NOTE: This medicine is only for you. Do not share this medicine with others. What if I miss a dose? This does not apply. What may interact with this medicine? Do not take this medicine with any of the following  medications: -amprenavir -bosentan -fosamprenavir This medicine may also interact with the following medications: -aprepitant -barbiturate medicines for inducing sleep or treating seizures -bexarotene -griseofulvin -medicines to treat seizures like carbamazepine, ethotoin, felbamate, oxcarbazepine, phenytoin, topiramate -modafinil -pioglitazone -rifabutin -rifampin -rifapentine -some medicines to treat HIV infection like atazanavir, indinavir, lopinavir, nelfinavir, tipranavir, ritonavir -St. John's wort -warfarin This list may not describe all possible interactions. Give your health care provider a list of all the medicines, herbs, non-prescription drugs, or dietary supplements you use. Also tell them if you smoke, drink alcohol, or use illegal drugs. Some items may interact with your medicine. What should I watch for while using this medicine? Visit your doctor or health care professional for regular check ups. See your doctor if you or your partner has sexual contact with others, becomes HIV positive, or gets a sexual transmitted disease. This product does not protect you against HIV infection (AIDS) or other sexually transmitted diseases. You can check the placement of the IUD yourself by reaching up to the top of your vagina with clean fingers to feel the threads. Do not pull on the threads. It is a good habit to check placement after each menstrual period. Call your doctor right away if you feel more of the IUD than just the threads or if you cannot feel the threads at all. The IUD may come out by itself. You may become pregnant if the device comes out. If you notice that the IUD has come out use a backup birth control method like condoms and call your health care provider. Using tampons will not change the position of the IUD and are okay to use during your period. What side effects may   I notice from receiving this medicine? Side effects that you should report to your doctor or  health care professional as soon as possible: -allergic reactions like skin rash, itching or hives, swelling of the face, lips, or tongue -fever, flu-like symptoms -genital sores -high blood pressure -no menstrual period for 6 weeks during use -pain, swelling, warmth in the leg -pelvic pain or tenderness -severe or sudden headache -signs of pregnancy -stomach cramping -sudden shortness of breath -trouble with balance, talking, or walking -unusual vaginal bleeding, discharge -yellowing of the eyes or skin Side effects that usually do not require medical attention (report to your doctor or health care professional if they continue or are bothersome): -acne -breast pain -change in sex drive or performance -changes in weight -cramping, dizziness, or faintness while the device is being inserted -headache -irregular menstrual bleeding within first 3 to 6 months of use -nausea This list may not describe all possible side effects. Call your doctor for medical advice about side effects. You may report side effects to FDA at 1-800-FDA-1088. Where should I keep my medicine? This does not apply. NOTE: This sheet is a summary. It may not cover all possible information. If you have questions about this medicine, talk to your doctor, pharmacist, or health care provider.  2015, Elsevier/Gold Standard. (2011-07-31 13:54:04)  

## 2014-09-12 LAB — CYTOLOGY - PAP

## 2014-09-21 ENCOUNTER — Emergency Department (INDEPENDENT_AMBULATORY_CARE_PROVIDER_SITE_OTHER)
Admission: EM | Admit: 2014-09-21 | Discharge: 2014-09-21 | Disposition: A | Discharge status: Home / Self Care | Payer: Self-pay | Source: Home / Self Care | Attending: Family Medicine | Admitting: Family Medicine

## 2014-09-21 ENCOUNTER — Encounter (HOSPITAL_COMMUNITY): Payer: Self-pay | Admitting: Emergency Medicine

## 2014-09-21 DIAGNOSIS — R1084 Generalized abdominal pain: Secondary | ICD-10-CM

## 2014-09-21 DIAGNOSIS — M79601 Pain in right arm: Secondary | ICD-10-CM

## 2014-09-21 MED ORDER — DICLOFENAC SODIUM 50 MG PO TBEC: 50.0000 mg | DELAYED_RELEASE_TABLET | Freq: Two times a day (BID) | ORAL | Status: DC | PRN
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
## 2014-09-21 NOTE — Discharge Instructions (Signed)
Thank you for coming in today. Take diclofenac twice daily for 2 weeks as needed for pain. Follow-up with primary care provider. Return as needed.  Contusion A contusion is a deep bruise. Contusions are the result of an injury that caused bleeding under the skin. The contusion may turn blue, purple, or yellow. Minor injuries will give you a painless contusion, but more severe contusions may stay painful and swollen for a few weeks.  CAUSES  A contusion is usually caused by a blow, trauma, or direct force to an area of the body. SYMPTOMS   Swelling and redness of the injured area.  Bruising of the injured area.  Tenderness and soreness of the injured area.  Pain. DIAGNOSIS  The diagnosis can be made by taking a history and physical exam. An X-ray, CT scan, or MRI may be needed to determine if there were any associated injuries, such as fractures. TREATMENT  Specific treatment will depend on what area of the body was injured. In general, the best treatment for a contusion is resting, icing, elevating, and applying cold compresses to the injured area. Over-the-counter medicines may also be recommended for pain control. Ask your caregiver what the best treatment is for your contusion. HOME CARE INSTRUCTIONS   Put ice on the injured area.  Put ice in a plastic bag.  Place a towel between your skin and the bag.  Leave the ice on for 15-20 minutes, 3-4 times a day, or as directed by your health care provider.  Only take over-the-counter or prescription medicines for pain, discomfort, or fever as directed by your caregiver. Your caregiver may recommend avoiding anti-inflammatory medicines (aspirin, ibuprofen, and naproxen) for 48 hours because these medicines may increase bruising.  Rest the injured area.  If possible, elevate the injured area to reduce swelling. SEEK IMMEDIATE MEDICAL CARE IF:   You have increased bruising or swelling.  You have pain that is getting worse.  Your  swelling or pain is not relieved with medicines. MAKE SURE YOU:   Understand these instructions.  Will watch your condition.  Will get help right away if you are not doing well or get worse. Document Released: 04/09/2005 Document Revised: 07/05/2013 Document Reviewed: 05/05/2011 [facility] Patient Information 2015 [place], [place]. This information is not intended to replace advice given to you by your health care provider. Make sure you discuss any questions you have with your health care provider.
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
## 2015-04-09 ENCOUNTER — Emergency Department (HOSPITAL_COMMUNITY)
Admission: EM | Admit: 2015-04-09 | Discharge: 2015-04-09 | Disposition: A | Discharge status: Home / Self Care | Payer: Self-pay | Source: Home / Self Care | Attending: Family Medicine | Admitting: Family Medicine

## 2015-08-03 IMAGING — CT CT HEAD W/O CM
1 series · 16 of 30 positions shown, 20 images · non-contrast
Comparison: None

CLINICAL DATA: Progressively worsening headache for 4 days,
intermittent blurred vision and nausea

EXAM:
CT HEAD WITHOUT CONTRAST
TECHNIQUE: Contiguous axial images were obtained from the base of the skull
through the vertex without intravenous contrast.

[Series 2: head 5.0 h30s · axial · 0.40mm/px · z∈[+1308,+1443]mm · 16 of 30 slices shown, 20 images]
[im 2/30  brain]
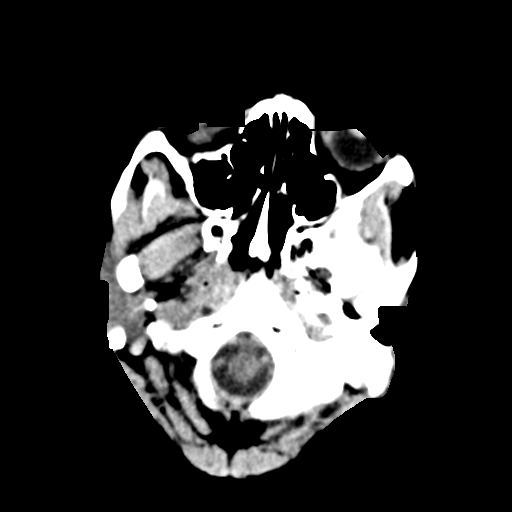
[im 2/30  bone]
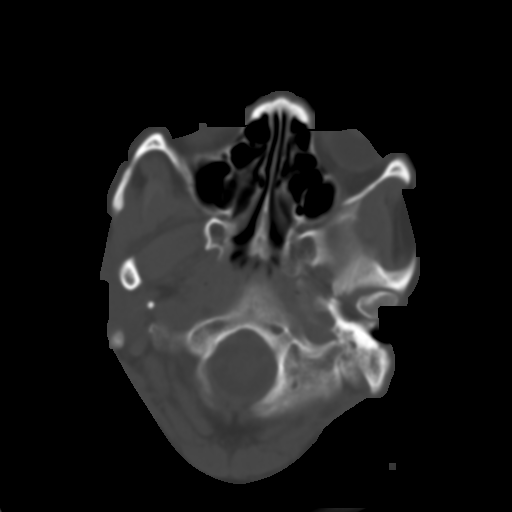
[im 4/30  brain]
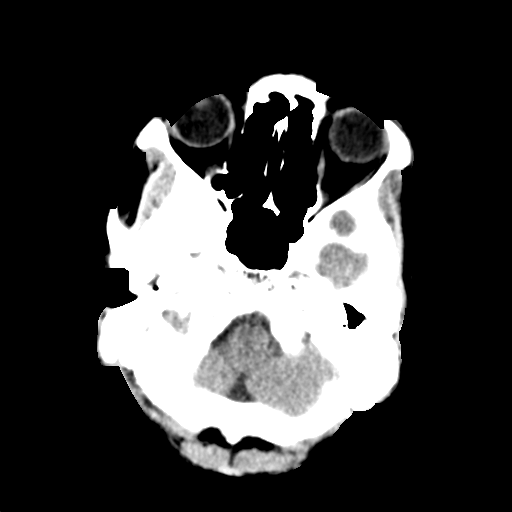
[im 6/30  brain]
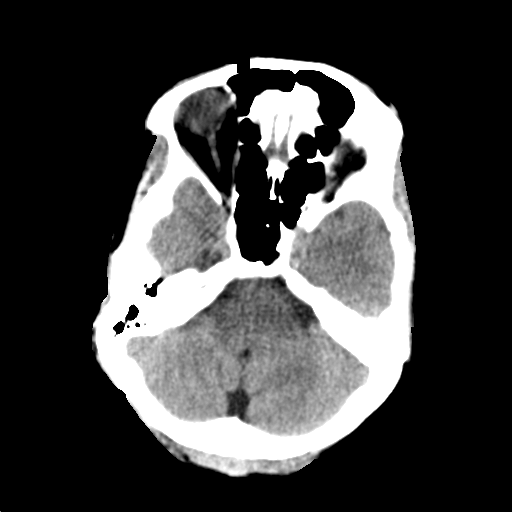
[im 8/30  brain]
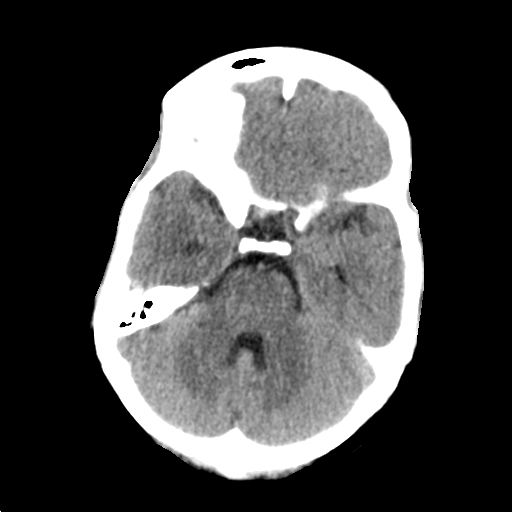
[im 9/30  brain]
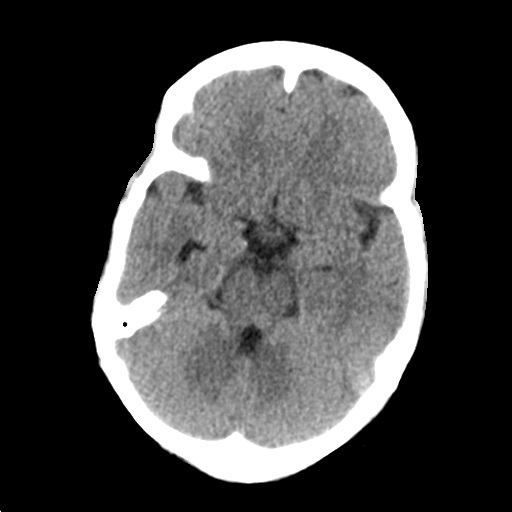
[im 9/30  bone]
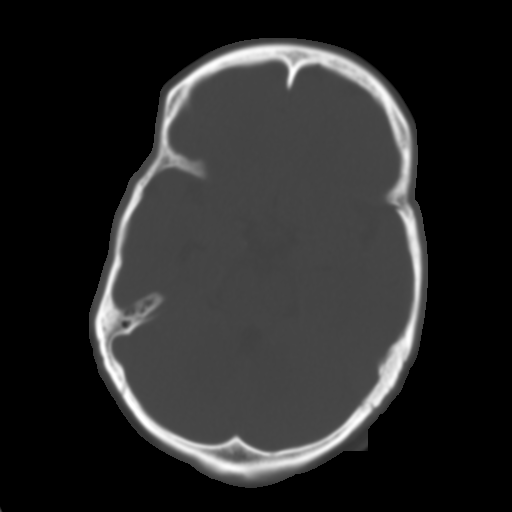
[im 11/30  brain]
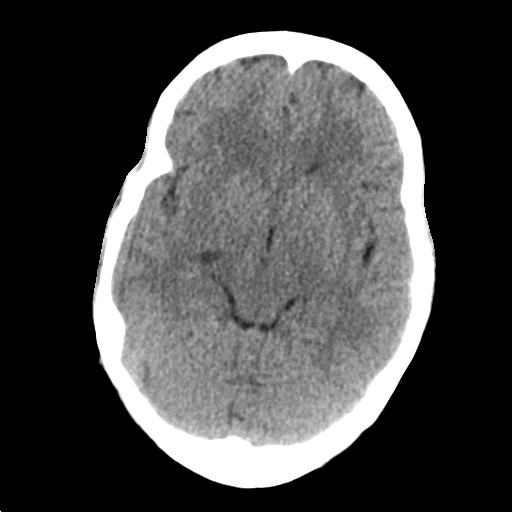
[im 13/30  brain]
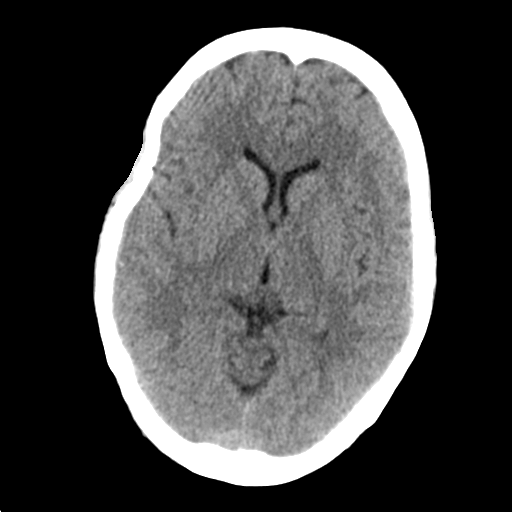
[im 15/30  brain]
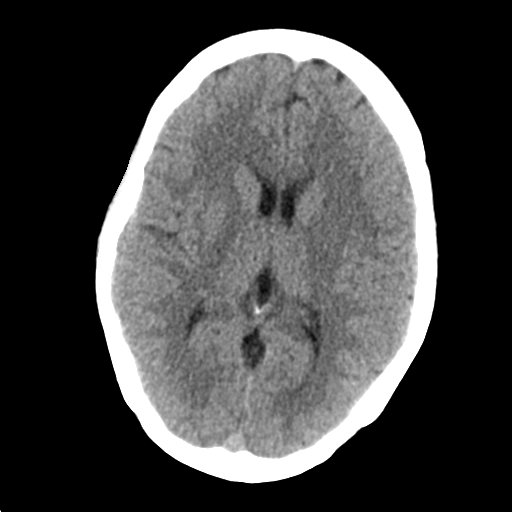
[im 16/30  brain]
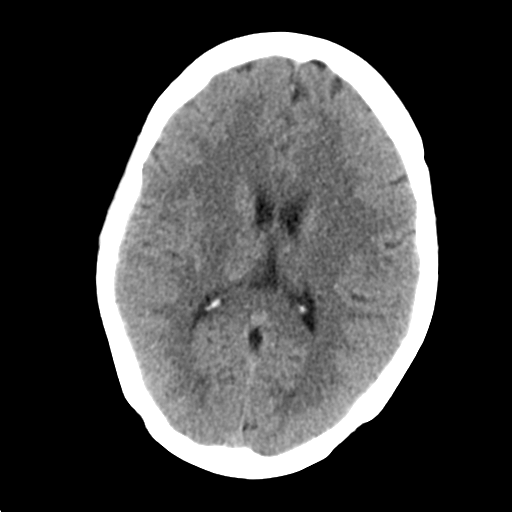
[im 16/30  bone]
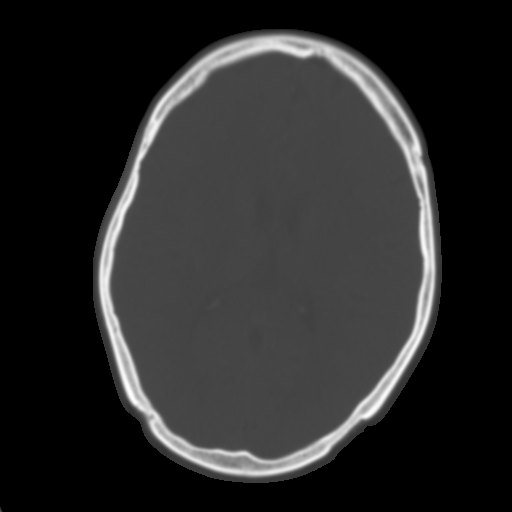
[im 18/30  brain]
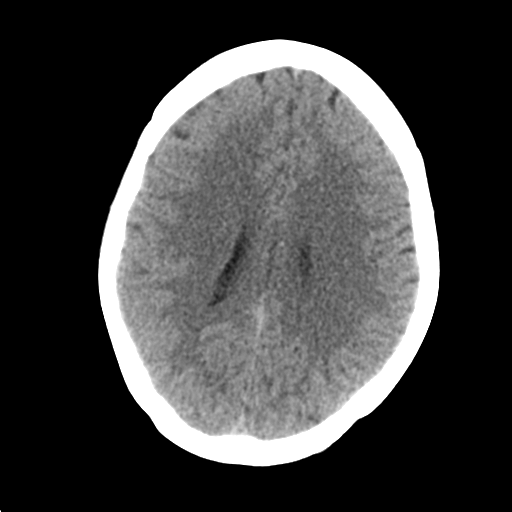
[im 20/30  brain]
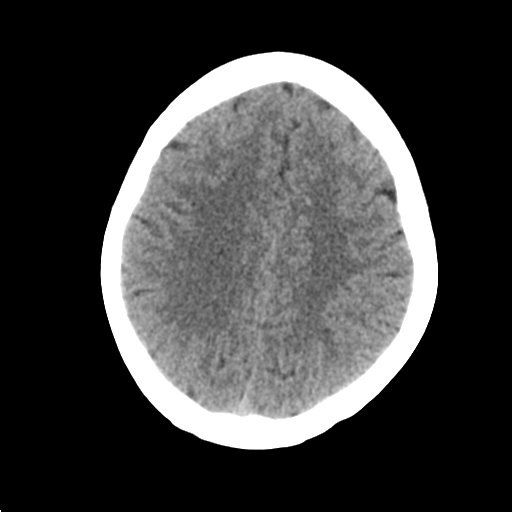
[im 22/30  brain]
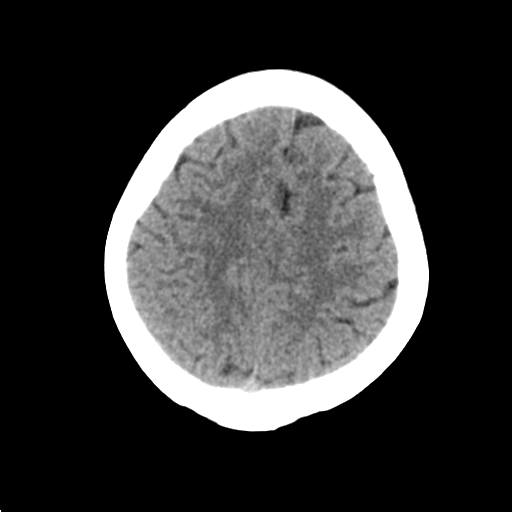
[im 23/30  brain]
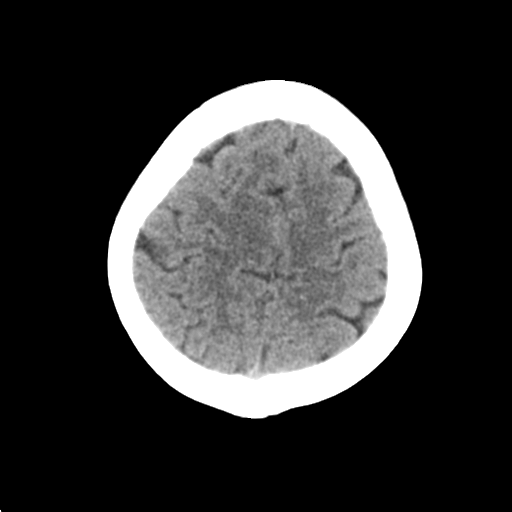
[im 23/30  bone]
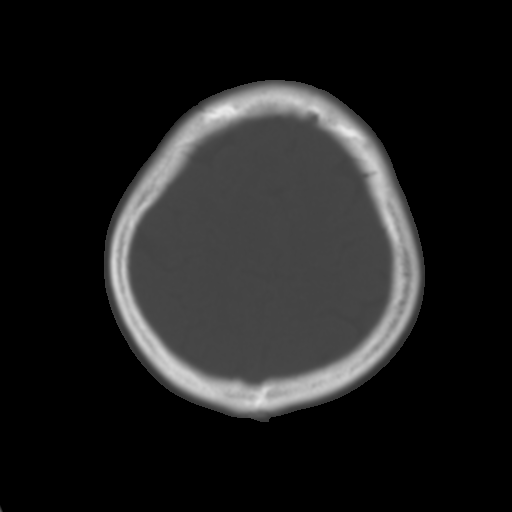
[im 25/30  brain]
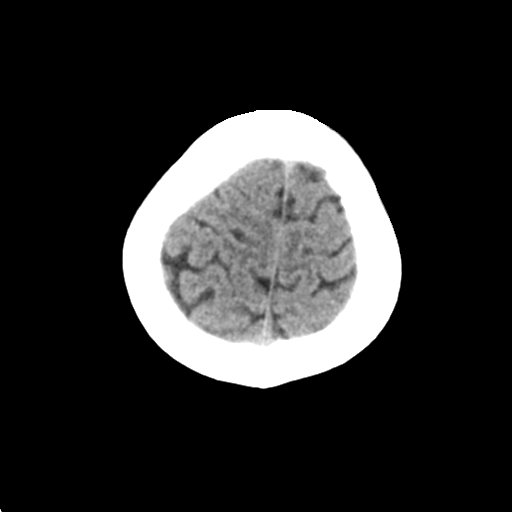
[im 27/30  brain]
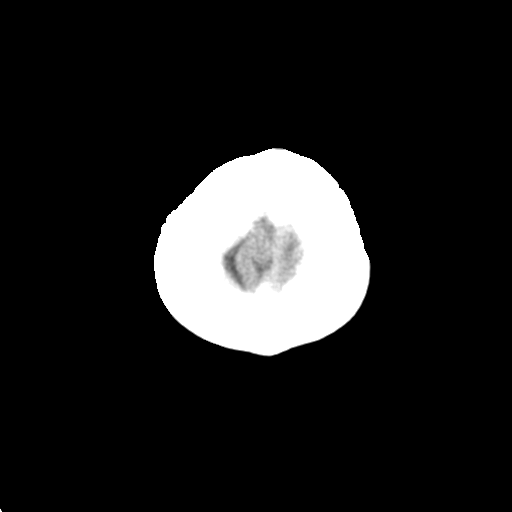
[im 29/30  brain]
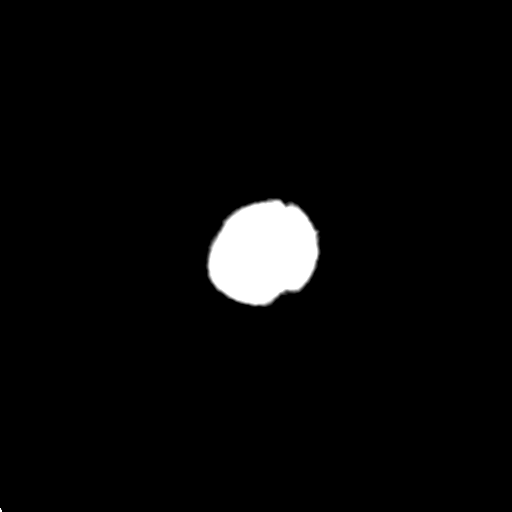

[16 of 30 positions shown; findings below may reference images not displayed]

FINDINGS: Normal ventricular morphology.

No midline shift or mass effect.

Normal appearance of brain parenchyma.

No intracranial hemorrhage, mass lesion, or acute infarction.

Visualized paranasal sinuses and mastoid air cells clear.

Bones unremarkable.
IMPRESSION: Normal exam.

## 2015-08-17 ENCOUNTER — Ambulatory Visit
Admission: RE | Admit: 2015-08-17 | Discharge: 2015-08-17 | Disposition: A | Discharge status: Home / Self Care | Payer: 59 | Source: Ambulatory Visit | Attending: Cardiovascular Disease | Admitting: Cardiovascular Disease

## 2015-08-17 ENCOUNTER — Other Ambulatory Visit: Payer: Self-pay | Admitting: Cardiovascular Disease

## 2015-08-17 DIAGNOSIS — Z0001 Encounter for general adult medical examination with abnormal findings: Secondary | ICD-10-CM

## 2015-08-17 DIAGNOSIS — R0789 Other chest pain: Secondary | ICD-10-CM

## 2015-09-13 ENCOUNTER — Ambulatory Visit: Payer: Self-pay | Admitting: Family Medicine

## 2015-09-14 ENCOUNTER — Ambulatory Visit (INDEPENDENT_AMBULATORY_CARE_PROVIDER_SITE_OTHER): Payer: 59 | Admitting: Obstetrics and Gynecology

## 2015-09-14 ENCOUNTER — Encounter: Payer: Self-pay | Admitting: Obstetrics and Gynecology

## 2015-09-14 ENCOUNTER — Encounter: Payer: Self-pay | Admitting: *Deleted

## 2015-09-14 VITALS — BP 118/78 | HR 71 | Temp 98.3°F | Wt 119.1 lb

## 2015-09-14 DIAGNOSIS — Z3046 Encounter for surveillance of implantable subdermal contraceptive: Secondary | ICD-10-CM | POA: Diagnosis not present

## 2015-09-14 DIAGNOSIS — Z30011 Encounter for initial prescription of contraceptive pills: Secondary | ICD-10-CM | POA: Diagnosis not present

## 2015-09-14 DIAGNOSIS — Z23 Encounter for immunization: Secondary | ICD-10-CM | POA: Diagnosis not present

## 2015-09-14 MED ORDER — NORGESTIMATE-ETH ESTRADIOL 0.25-35 MG-MCG PO TABS: 1.0000 | ORAL_TABLET | Freq: Every day | ORAL | Status: DC

## 2015-09-14 NOTE — Progress Notes (Signed)
Patient ID: Sabrina Daniels, female   DOB: 02/21/1981, 35 y.o.   MRN: [id number] 35 yo G4P4 here for Nexplanon removal. Patient has had it in place since 09/2013. She reports frequent headaches and malaise which she attributes to the nexplanon. Patient plans on using OCP for contraception  Removal Patient given informed consent for removal of her Implanon, time out was performed.  Signed copy in the chart.  Appropriate time out taken. Implanon site identified.  Area prepped in usual sterile fashon. One cc of 1% lidocaine was used to anesthetize the area at the distal end of the implant. A small stab incision was made right beside the implant on the distal portion.  The implanon rod was grasped using hemostats and removed without difficulty.  There was less than 3 cc blood loss. There were no complications.  A small amount of antibiotic ointment and steri-strips were applied over the small incision.  A pressure bandage was applied to reduce any bruising.  The patient tolerated the procedure well and was given post procedure instructions.

## 2015-09-14 NOTE — Addendum Note (Signed)
Addended by: Garret ReddishBARNES, Helena Sardo M on: 09/14/2015 09:17 AM   Modules accepted: Orders

## 2016-06-12 ENCOUNTER — Other Ambulatory Visit (HOSPITAL_COMMUNITY): Payer: Self-pay | Admitting: Nurse Practitioner

## 2016-06-12 DIAGNOSIS — O09212 Supervision of pregnancy with history of pre-term labor, second trimester: Secondary | ICD-10-CM

## 2016-06-12 DIAGNOSIS — Z3A19 19 weeks gestation of pregnancy: Secondary | ICD-10-CM

## 2016-06-12 DIAGNOSIS — Z3689 Encounter for other specified antenatal screening: Secondary | ICD-10-CM

## 2016-06-12 DIAGNOSIS — O09292 Supervision of pregnancy with other poor reproductive or obstetric history, second trimester: Secondary | ICD-10-CM

## 2016-06-12 DIAGNOSIS — O09522 Supervision of elderly multigravida, second trimester: Secondary | ICD-10-CM

## 2016-06-12 DIAGNOSIS — O09892 Supervision of other high risk pregnancies, second trimester: Secondary | ICD-10-CM

## 2016-06-12 LAB — OB RESULTS CONSOLE HGB/HCT, BLOOD
HCT: 39 %
HEMOGLOBIN: 12.1 g/dL

## 2016-06-12 LAB — OB RESULTS CONSOLE ABO/RH: RH Type: POSITIVE

## 2016-06-12 LAB — OB RESULTS CONSOLE RUBELLA ANTIBODY, IGM: RUBELLA: IMMUNE

## 2016-06-12 LAB — OB RESULTS CONSOLE HIV ANTIBODY (ROUTINE TESTING): HIV: NONREACTIVE

## 2016-06-12 LAB — OB RESULTS CONSOLE HEPATITIS B SURFACE ANTIGEN: HEP B S AG: NEGATIVE

## 2016-06-12 LAB — OB RESULTS CONSOLE GC/CHLAMYDIA
CHLAMYDIA, DNA PROBE: NEGATIVE
Gonorrhea: NEGATIVE

## 2016-06-12 LAB — OB RESULTS CONSOLE PLATELET COUNT: Platelets: 181 10*3/uL

## 2016-06-12 LAB — OB RESULTS CONSOLE ANTIBODY SCREEN: Antibody Screen: NEGATIVE

## 2016-06-12 LAB — OB RESULTS CONSOLE RPR: RPR: NONREACTIVE

## 2016-06-12 LAB — CYSTIC FIBROSIS DIAGNOSTIC STUDY: INTERPRETATION-CFDNA: NEGATIVE

## 2016-06-13 ENCOUNTER — Encounter (HOSPITAL_COMMUNITY): Payer: Self-pay | Admitting: Nurse Practitioner

## 2016-06-20 ENCOUNTER — Encounter (HOSPITAL_COMMUNITY): Payer: Self-pay | Admitting: *Deleted

## 2016-06-23 ENCOUNTER — Ambulatory Visit (HOSPITAL_COMMUNITY)
Admission: RE | Admit: 2016-06-23 | Discharge: 2016-06-23 | Disposition: A | Discharge status: Home / Self Care | Payer: Medicaid Other | Source: Ambulatory Visit | Attending: Nurse Practitioner | Admitting: Nurse Practitioner

## 2016-06-23 ENCOUNTER — Encounter (HOSPITAL_COMMUNITY): Payer: Self-pay

## 2016-06-23 ENCOUNTER — Ambulatory Visit (HOSPITAL_COMMUNITY): Admission: RE | Admit: 2016-06-23 | Payer: Medicaid Other | Source: Ambulatory Visit

## 2016-06-23 DIAGNOSIS — O09892 Supervision of other high risk pregnancies, second trimester: Secondary | ICD-10-CM

## 2016-06-23 DIAGNOSIS — O09292 Supervision of pregnancy with other poor reproductive or obstetric history, second trimester: Secondary | ICD-10-CM

## 2016-06-23 DIAGNOSIS — Z3A19 19 weeks gestation of pregnancy: Secondary | ICD-10-CM

## 2016-06-23 DIAGNOSIS — O09212 Supervision of pregnancy with history of pre-term labor, second trimester: Secondary | ICD-10-CM | POA: Insufficient documentation

## 2016-06-23 DIAGNOSIS — Z3A23 23 weeks gestation of pregnancy: Secondary | ICD-10-CM | POA: Diagnosis not present

## 2016-06-23 DIAGNOSIS — Z363 Encounter for antenatal screening for malformations: Secondary | ICD-10-CM | POA: Diagnosis not present

## 2016-06-23 DIAGNOSIS — Z3689 Encounter for other specified antenatal screening: Secondary | ICD-10-CM

## 2016-06-23 DIAGNOSIS — O09522 Supervision of elderly multigravida, second trimester: Secondary | ICD-10-CM

## 2016-06-27 ENCOUNTER — Other Ambulatory Visit (HOSPITAL_COMMUNITY): Payer: Self-pay

## 2016-06-27 ENCOUNTER — Encounter: Payer: Self-pay | Admitting: *Deleted

## 2016-06-28 ENCOUNTER — Other Ambulatory Visit: Payer: Self-pay | Admitting: Nurse Practitioner

## 2016-06-30 ENCOUNTER — Ambulatory Visit (INDEPENDENT_AMBULATORY_CARE_PROVIDER_SITE_OTHER): Payer: Medicaid Other | Admitting: Obstetrics and Gynecology

## 2016-06-30 ENCOUNTER — Encounter: Payer: Self-pay | Admitting: Obstetrics and Gynecology

## 2016-06-30 VITALS — BP 115/80 | HR 81 | Wt 124.8 lb

## 2016-06-30 DIAGNOSIS — D573 Sickle-cell trait: Secondary | ICD-10-CM

## 2016-06-30 DIAGNOSIS — Z603 Acculturation difficulty: Secondary | ICD-10-CM | POA: Insufficient documentation

## 2016-06-30 DIAGNOSIS — O0992 Supervision of high risk pregnancy, unspecified, second trimester: Secondary | ICD-10-CM

## 2016-06-30 DIAGNOSIS — O09299 Supervision of pregnancy with other poor reproductive or obstetric history, unspecified trimester: Secondary | ICD-10-CM

## 2016-06-30 DIAGNOSIS — O09529 Supervision of elderly multigravida, unspecified trimester: Secondary | ICD-10-CM | POA: Insufficient documentation

## 2016-06-30 DIAGNOSIS — O09292 Supervision of pregnancy with other poor reproductive or obstetric history, second trimester: Secondary | ICD-10-CM

## 2016-06-30 DIAGNOSIS — O09522 Supervision of elderly multigravida, second trimester: Secondary | ICD-10-CM

## 2016-06-30 DIAGNOSIS — Z789 Other specified health status: Secondary | ICD-10-CM | POA: Insufficient documentation

## 2016-06-30 DIAGNOSIS — O99012 Anemia complicating pregnancy, second trimester: Secondary | ICD-10-CM

## 2016-06-30 LAB — COMPREHENSIVE METABOLIC PANEL
ALBUMIN: 3.2 g/dL — AB (ref 3.6–5.1)
ALT: 14 U/L (ref 6–29)
AST: 20 U/L (ref 10–30)
Alkaline Phosphatase: 56 U/L (ref 33–115)
BUN: 11 mg/dL (ref 7–25)
CALCIUM: 8.5 mg/dL — AB (ref 8.6–10.2)
CHLORIDE: 106 mmol/L (ref 98–110)
CO2: 23 mmol/L (ref 20–31)
Creat: 0.53 mg/dL (ref 0.50–1.10)
Glucose, Bld: 77 mg/dL (ref 65–99)
POTASSIUM: 3.9 mmol/L (ref 3.5–5.3)
Sodium: 136 mmol/L (ref 135–146)
Total Bilirubin: 0.3 mg/dL (ref 0.2–1.2)
Total Protein: 6 g/dL — ABNORMAL LOW (ref 6.1–8.1)

## 2016-06-30 MED ORDER — ASPIRIN EC 81 MG PO TBEC: 81.0000 mg | DELAYED_RELEASE_TABLET | Freq: Every day | ORAL | 1 refills | Status: DC

## 2016-06-30 NOTE — Progress Notes (Signed)
Estanislado SpireJarai interpreter present for visit
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
## 2016-07-01 ENCOUNTER — Telehealth (HOSPITAL_COMMUNITY): Payer: Self-pay | Admitting: MS"

## 2016-07-01 NOTE — Telephone Encounter (Signed)
Attempted to call patient regarding results of Noninvasive prenatal screening (NIPS)/Panorama, which were within normal range. However, no telephone [place] interpreter was available at this time to assist with contacting the patient.   Clydie BraunKaren Cherica Heiden 07/01/2016 9:12 AM

## 2016-07-14 NOTE — L&D Delivery Note (Signed)
Delivery Note At 2:40 PM a viable female was delivered via  (Presentation vertex:LOA ;  ).  APGAR:9 ,9 ; weight  .   Placenta status: spont, shultz.  Cord:3 vc  with the following complications:none .  Cord pH: n/a  Anesthesia:  none Episiotomy:  none Lacerations:  none Suture Repair: n/a Est. Blood Loss 300(mL):    Mom to postpartum.  Baby to Couplet care / Skin to Skin.  Sabrina Daniels 10/12/2016, 2:47 PM

## 2016-07-17 ENCOUNTER — Ambulatory Visit (INDEPENDENT_AMBULATORY_CARE_PROVIDER_SITE_OTHER): Payer: Medicaid Other | Admitting: Obstetrics and Gynecology

## 2016-07-17 VITALS — BP 107/72 | HR 82 | Wt 131.4 lb

## 2016-07-17 DIAGNOSIS — O34219 Maternal care for unspecified type scar from previous cesarean delivery: Secondary | ICD-10-CM

## 2016-07-17 DIAGNOSIS — Z23 Encounter for immunization: Secondary | ICD-10-CM | POA: Diagnosis not present

## 2016-07-17 DIAGNOSIS — O09292 Supervision of pregnancy with other poor reproductive or obstetric history, second trimester: Secondary | ICD-10-CM | POA: Diagnosis not present

## 2016-07-17 DIAGNOSIS — Z789 Other specified health status: Secondary | ICD-10-CM | POA: Diagnosis not present

## 2016-07-17 DIAGNOSIS — O0990 Supervision of high risk pregnancy, unspecified, unspecified trimester: Secondary | ICD-10-CM

## 2016-07-17 DIAGNOSIS — O09522 Supervision of elderly multigravida, second trimester: Secondary | ICD-10-CM

## 2016-07-17 DIAGNOSIS — O09299 Supervision of pregnancy with other poor reproductive or obstetric history, unspecified trimester: Secondary | ICD-10-CM

## 2016-07-17 LAB — BASIC METABOLIC PANEL
BUN: 8 mg/dL (ref 7–25)
CO2: 20 mmol/L (ref 20–31)
CREATININE: 0.43 mg/dL — AB (ref 0.50–1.10)
Calcium: 8.5 mg/dL — ABNORMAL LOW (ref 8.6–10.2)
Chloride: 105 mmol/L (ref 98–110)
Glucose, Bld: 62 mg/dL — ABNORMAL LOW (ref 65–99)
Potassium: 3.7 mmol/L (ref 3.5–5.3)
Sodium: 138 mmol/L (ref 135–146)

## 2016-07-17 LAB — POCT URINALYSIS DIP (DEVICE)
Bilirubin Urine: NEGATIVE
GLUCOSE, UA: NEGATIVE mg/dL
HGB URINE DIPSTICK: NEGATIVE
Ketones, ur: NEGATIVE mg/dL
NITRITE: NEGATIVE
PROTEIN: NEGATIVE mg/dL
SPECIFIC GRAVITY, URINE: 1.02 (ref 1.005–1.030)
UROBILINOGEN UA: 0.2 mg/dL (ref 0.0–1.0)
pH: 6 (ref 5.0–8.0)

## 2016-07-17 LAB — CBC
HCT: 36.4 % (ref 35.0–45.0)
Hemoglobin: 11.7 g/dL (ref 11.7–15.5)
MCH: 23.4 pg — AB (ref 27.0–33.0)
MCHC: 32.1 g/dL (ref 32.0–36.0)
MCV: 72.9 fL — ABNORMAL LOW (ref 80.0–100.0)
PLATELETS: 191 10*3/uL (ref 140–400)
RBC: 4.99 MIL/uL (ref 3.80–5.10)
RDW: 15.7 % — AB (ref 11.0–15.0)
WBC: 9.5 10*3/uL (ref 3.8–10.8)

## 2016-07-17 MED ORDER — TETANUS-DIPHTH-ACELL PERTUSSIS 5-2.5-18.5 LF-MCG/0.5 IM SUSP
0.5000 mL | Freq: Once | INTRAMUSCULAR | Status: AC
  Administered 2016-07-17: 0.5 mL via INTRAMUSCULAR

## 2016-07-17 NOTE — Progress Notes (Signed)
28 week labs today, tdap Unable to obtain [place] interpreter, pt does not understand [place], understands some [place]

## 2016-07-17 NOTE — Addendum Note (Signed)
Addended by: Garret ReddishBARNES, Mavis Gravelle M on: 07/17/2016 09:06 AM   Modules accepted: Orders

## 2016-07-17 NOTE — Progress Notes (Signed)
Subjective:  Sabrina Daniels is a 36 y.o. [id number] at 423w0d being seen today for ongoing prenatal care.  She is currently monitored for the following issues for this high-risk pregnancy and has History of HELLP syndrome, currently pregnant; Sickle cell trait (HCC); Supervision of high-risk pregnancy; VBAC (vaginal birth after Cesarean); Language barrier; and AMA (advanced maternal age) multigravida 35+, second trimester on her problem list.  Patient reports no complaints.  Contractions: Not present. Vag. Bleeding: None.  Movement: Present. Denies leaking of fluid.   The following portions of the patient's history were reviewed and updated as appropriate: allergies, current medications, past family history, past medical history, past social history, past surgical history and problem list. Problem list updated.  Objective:   Vitals:   07/17/16 0757  BP: 107/72  Pulse: 82  Weight: 131 lb 6.4 oz (59.6 kg)    Fetal Status: Fetal Heart Rate (bpm): 140   Movement: Present     General:  Alert, oriented and cooperative. Patient is in no acute distress.  Skin: Skin is warm and dry. No rash noted.   Cardiovascular: Normal heart rate noted  Respiratory: Normal respiratory effort, no problems with respiration noted  Abdomen: Soft, gravid, appropriate for gestational age. Pain/Pressure: Absent     Pelvic:  Cervical exam deferred        Extremities: Normal range of motion.  Edema: None  Mental Status: Normal mood and affect. Normal behavior. Normal judgment and thought content.   Urinalysis:      Assessment and Plan:  Pregnancy: [id number] at 3423w0d  1. Supervision of high risk pregnancy, antepartum, unspecified trimester  - 2Hr GTT w/ 1 Hr Carpenter 75 g - CBC - HIV antibody (with reflex) - RPR  2. Need for Tdap vaccination Given today - Tdap (BOOSTRIX) injection 0.5 mL; Inject 0.5 mLs into the muscle once.  3. AMA (advanced maternal age) multigravida 35+, second trimester NIPS normal  4.  Language barrier Understands some English  5. VBAC (vaginal birth after Cesarean) Discuss at next OB visit  6. Supervision of high risk pregnancy, antepartum stable  7. History of HELLP syndrome, currently pregnant Urine for PC ratio today BP stable Continue with BASA  Preterm labor symptoms and general obstetric precautions including but not limited to vaginal bleeding, contractions, leaking of fluid and fetal movement were reviewed in detail with the patient. Please refer to After Visit Summary for other counseling recommendations.  Return in about 3 weeks (around 08/07/2016).   Hermina StaggersMichael L Antania Hoefling, MD

## 2016-07-17 NOTE — Addendum Note (Signed)
Addended by: Hermina StaggersERVIN, MICHAEL L on: 07/17/2016 09:30 AM   Modules accepted: Orders

## 2016-07-18 LAB — RPR

## 2016-07-18 LAB — PROTEIN / CREATININE RATIO, URINE
Creatinine, Urine: 180 mg/dL (ref 20–320)
Protein Creatinine Ratio: 167 mg/g creat — ABNORMAL HIGH (ref 21–161)
Total Protein, Urine: 30 mg/dL — ABNORMAL HIGH (ref 5–24)

## 2016-07-18 LAB — 2HR GTT W 1 HR, CARPENTER, 75 G
GLUCOSE, 1 HR, GEST: 143 mg/dL (ref <180)
GLUCOSE, 2 HR, GEST: 95 mg/dL (ref <153)
GLUCOSE, FASTING, GEST: 68 mg/dL (ref 65–91)

## 2016-07-18 LAB — HIV ANTIBODY (ROUTINE TESTING W REFLEX): HIV: NONREACTIVE

## 2016-07-24 ENCOUNTER — Other Ambulatory Visit: Payer: Self-pay

## 2016-07-24 MED ORDER — PRENATAL VITAMINS 0.8 MG PO TABS: 1.0000 | ORAL_TABLET | Freq: Every day | ORAL | 12 refills | Status: DC

## 2016-07-24 NOTE — Telephone Encounter (Signed)
Patient called and wanted a rx for more prenatal vitamins. Medication have been called into her pharmacy ([facility]).

## 2016-08-07 ENCOUNTER — Encounter: Payer: Self-pay | Admitting: Advanced Practice Midwife

## 2016-08-07 ENCOUNTER — Ambulatory Visit (INDEPENDENT_AMBULATORY_CARE_PROVIDER_SITE_OTHER): Payer: Medicaid Other | Admitting: Advanced Practice Midwife

## 2016-08-07 VITALS — BP 112/70 | HR 68 | Wt 132.8 lb

## 2016-08-07 DIAGNOSIS — O34219 Maternal care for unspecified type scar from previous cesarean delivery: Secondary | ICD-10-CM

## 2016-08-07 DIAGNOSIS — O99613 Diseases of the digestive system complicating pregnancy, third trimester: Secondary | ICD-10-CM

## 2016-08-07 DIAGNOSIS — O0990 Supervision of high risk pregnancy, unspecified, unspecified trimester: Secondary | ICD-10-CM

## 2016-08-07 DIAGNOSIS — O09293 Supervision of pregnancy with other poor reproductive or obstetric history, third trimester: Secondary | ICD-10-CM

## 2016-08-07 DIAGNOSIS — O9913 Other diseases of the blood and blood-forming organs and certain disorders involving the immune mechanism complicating the puerperium: Secondary | ICD-10-CM

## 2016-08-07 DIAGNOSIS — K219 Gastro-esophageal reflux disease without esophagitis: Secondary | ICD-10-CM

## 2016-08-07 DIAGNOSIS — O26899 Other specified pregnancy related conditions, unspecified trimester: Secondary | ICD-10-CM

## 2016-08-07 DIAGNOSIS — O09522 Supervision of elderly multigravida, second trimester: Secondary | ICD-10-CM

## 2016-08-07 DIAGNOSIS — O09523 Supervision of elderly multigravida, third trimester: Secondary | ICD-10-CM

## 2016-08-07 DIAGNOSIS — D573 Sickle-cell trait: Secondary | ICD-10-CM

## 2016-08-07 DIAGNOSIS — O09299 Supervision of pregnancy with other poor reproductive or obstetric history, unspecified trimester: Secondary | ICD-10-CM

## 2016-08-07 DIAGNOSIS — R110 Nausea: Secondary | ICD-10-CM

## 2016-08-07 LAB — POCT URINALYSIS DIP (DEVICE)
BILIRUBIN URINE: NEGATIVE
GLUCOSE, UA: NEGATIVE mg/dL
Hgb urine dipstick: NEGATIVE
Ketones, ur: NEGATIVE mg/dL
LEUKOCYTES UA: NEGATIVE
NITRITE: NEGATIVE
Protein, ur: NEGATIVE mg/dL
Specific Gravity, Urine: 1.015 (ref 1.005–1.030)
Urobilinogen, UA: 0.2 mg/dL (ref 0.0–1.0)
pH: 7 (ref 5.0–8.0)

## 2016-08-07 MED ORDER — ONDANSETRON HCL 4 MG PO TABS: 4.0000 mg | ORAL_TABLET | Freq: Three times a day (TID) | ORAL | 1 refills | Status: DC | PRN

## 2016-08-07 MED ORDER — FAMOTIDINE 20 MG PO TABS: 20.0000 mg | ORAL_TABLET | Freq: Two times a day (BID) | ORAL | 3 refills | Status: DC

## 2016-08-07 NOTE — Patient Instructions (Addendum)
Contraception Choices Birth control (contraception) is the use of any methods or devices to stop pregnancy from happening. Below are some methods to help avoid pregnancy. Hormonal birth control  A small tube put under the skin of the upper arm (implant). The tube can stay in place for 3 years. The implant must be taken out after 3 years.  Shots given every 3 months.  Pills taken every day.  Patches that are changed once a week.  A ring put into the vagina (vaginal ring). The ring is left in place for 3 weeks and removed for 1 week. Then, a new ring is put in the vagina.  Emergency birth control pills taken after unprotected sex (intercourse). Barrier birth control  A thin covering worn on the penis (female condom) during sex.  A soft, loose covering put into the vagina (female condom) before sex.  A rubber bowl that sits over the cervix (diaphragm). The bowl must be made for you. The bowl is put into the vagina before sex. The bowl is left in place for 6 to 8 hours after sex.  A small, soft cup that fits over the cervix (cervical cap). The cup must be made for you. The cup can be left in place for 48 hours after sex.  A sponge that is put into the vagina before sex.  A chemical that kills or stops sperm from getting into the cervix and uterus (spermicide). The chemical may be a cream, jelly, foam, or pill. Intrauterine (IUD) birth control  IUD birth control is a small, T-shaped piece of plastic. The plastic is put inside the uterus. There are 2 types of IUD:  Copper IUD. The IUD is covered in copper wire. The copper makes a fluid that kills sperm. It can stay in place for 10 years.  Hormone IUD. The hormone stops pregnancy from happening. It can stay in place for 5 years. Permanent methods  When the woman has her fallopian tubes sealed, tied, or blocked during surgery. This stops the egg from traveling to the uterus.  The doctor places a small coil or insert into each fallopian  tube. This causes scar tissue to form and blocks the fallopian tubes.  When the female has the tubes that carry sperm tied off (vasectomy). Natural family planning birth control  Natural family planning means not having sex or using barrier birth control on the days the woman could become pregnant.  Use a calendar to keep track of the length of each period and know the days she can get pregnant.  Avoid sex during ovulation.  Use a thermometer to measure body temperature. Also watch for symptoms of ovulation.  Time sex to be after the woman has ovulated. Use condoms to help protect yourself against sexually transmitted infections (STIs). Do this no matter what type of birth control you use. Talk to your doctor about which type of birth control is best for you. This information is not intended to replace advice given to you by your health care provider. Make sure you discuss any questions you have with your health care provider. Document Released: 04/27/2009 Document Revised: 12/06/2015 Document Reviewed: 01/19/2013 Elsevier Interactive Patient Education  2017 Elsevier Inc.  

## 2016-08-07 NOTE — Progress Notes (Signed)
Used interpreter [employer]ma[employer].

## 2016-08-08 ENCOUNTER — Inpatient Hospital Stay (HOSPITAL_COMMUNITY)
Admission: AD | Admit: 2016-08-08 | Discharge: 2016-08-08 | Disposition: A | Discharge status: Home / Self Care | Payer: Medicaid Other | Source: Ambulatory Visit | Attending: Obstetrics & Gynecology | Admitting: Obstetrics & Gynecology

## 2016-08-08 ENCOUNTER — Encounter (HOSPITAL_COMMUNITY): Payer: Self-pay

## 2016-08-08 DIAGNOSIS — J09X2 Influenza due to identified novel influenza A virus with other respiratory manifestations: Secondary | ICD-10-CM | POA: Insufficient documentation

## 2016-08-08 DIAGNOSIS — Z3A30 30 weeks gestation of pregnancy: Secondary | ICD-10-CM | POA: Insufficient documentation

## 2016-08-08 DIAGNOSIS — O98513 Other viral diseases complicating pregnancy, third trimester: Secondary | ICD-10-CM | POA: Diagnosis not present

## 2016-08-08 DIAGNOSIS — D573 Sickle-cell trait: Secondary | ICD-10-CM | POA: Diagnosis not present

## 2016-08-08 DIAGNOSIS — R51 Headache: Secondary | ICD-10-CM | POA: Diagnosis present

## 2016-08-08 DIAGNOSIS — O9989 Other specified diseases and conditions complicating pregnancy, childbirth and the puerperium: Secondary | ICD-10-CM | POA: Diagnosis not present

## 2016-08-08 DIAGNOSIS — J101 Influenza due to other identified influenza virus with other respiratory manifestations: Secondary | ICD-10-CM

## 2016-08-08 DIAGNOSIS — O09523 Supervision of elderly multigravida, third trimester: Secondary | ICD-10-CM | POA: Insufficient documentation

## 2016-08-08 DIAGNOSIS — R509 Fever, unspecified: Secondary | ICD-10-CM | POA: Diagnosis present

## 2016-08-08 LAB — CBC WITH DIFFERENTIAL/PLATELET
BASOS ABS: 0 10*3/uL (ref 0.0–0.1)
Basophils Relative: 0 %
Eosinophils Absolute: 0 10*3/uL (ref 0.0–0.7)
Eosinophils Relative: 0 %
HEMATOCRIT: 31.3 % — AB (ref 36.0–46.0)
HEMOGLOBIN: 10.9 g/dL — AB (ref 12.0–15.0)
LYMPHS PCT: 11 %
Lymphs Abs: 1 10*3/uL (ref 0.7–4.0)
MCH: 24.3 pg — ABNORMAL LOW (ref 26.0–34.0)
MCHC: 34.8 g/dL (ref 30.0–36.0)
MCV: 69.9 fL — AB (ref 78.0–100.0)
MONO ABS: 0.4 10*3/uL (ref 0.1–1.0)
MONOS PCT: 5 %
NEUTROS ABS: 7 10*3/uL (ref 1.7–7.7)
Neutrophils Relative %: 83 %
Platelets: 125 10*3/uL — ABNORMAL LOW (ref 150–400)
RBC: 4.48 MIL/uL (ref 3.87–5.11)
RDW: 15.1 % (ref 11.5–15.5)
WBC: 8.4 10*3/uL (ref 4.0–10.5)

## 2016-08-08 LAB — URINALYSIS, ROUTINE W REFLEX MICROSCOPIC
Bilirubin Urine: NEGATIVE
Glucose, UA: 50 mg/dL — AB
Hgb urine dipstick: NEGATIVE
Ketones, ur: 5 mg/dL — AB
LEUKOCYTES UA: NEGATIVE
Nitrite: NEGATIVE
PH: 6 (ref 5.0–8.0)
PROTEIN: NEGATIVE mg/dL
Specific Gravity, Urine: 1.016 (ref 1.005–1.030)

## 2016-08-08 LAB — CULTURE, OB URINE: Organism ID, Bacteria: NO GROWTH

## 2016-08-08 LAB — INFLUENZA PANEL BY PCR (TYPE A & B)
Influenza A By PCR: POSITIVE — AB
Influenza B By PCR: NEGATIVE

## 2016-08-08 MED ORDER — OSELTAMIVIR PHOSPHATE 75 MG PO CAPS: 75.0000 mg | ORAL_CAPSULE | Freq: Two times a day (BID) | ORAL | 0 refills | Status: AC

## 2016-08-08 MED ORDER — OSELTAMIVIR PHOSPHATE 75 MG PO CAPS
75.0000 mg | ORAL_CAPSULE | Freq: Once | ORAL | Status: AC
  Administered 2016-08-08: 75 mg via ORAL
  Filled 2016-08-08: qty 1

## 2016-08-08 MED ORDER — LACTATED RINGERS IV BOLUS (SEPSIS)
1000.0000 mL | Freq: Once | INTRAVENOUS | Status: AC
  Administered 2016-08-08: 1000 mL via INTRAVENOUS

## 2016-08-08 NOTE — Progress Notes (Signed)
   PRENATAL VISIT NOTE  Subjective:  Sabrina Daniels is a 36 y.o. [id number] at 3452w0d being seen today for ongoing prenatal care.  She is currently monitored for the following issues for this high-risk pregnancy and has History of HELLP syndrome, currently pregnant; Sickle cell trait (HCC); Supervision of high-risk pregnancy; VBAC (vaginal birth after Cesarean); Language barrier; and AMA (advanced maternal age) multigravida 35+, second trimester on her problem list.  Patient reports heartburn and nausea.  Contractions: Not present. Vag. Bleeding: None.  Movement: Present. Denies leaking of fluid.   The following portions of the patient's history were reviewed and updated as appropriate: allergies, current medications, past family history, past medical history, past social history, past surgical history and problem list. Problem list updated.  Objective:   Vitals:   08/07/16 1045  BP: 112/70  Pulse: 68  Weight: 132 lb 12.8 oz (60.2 kg)    Fetal Status: Fetal Heart Rate (bpm): 143 Fundal Height: 30 cm Movement: Present     General:  Alert, oriented and cooperative. Patient is in no acute distress.  Skin: Skin is warm and dry. No rash noted.   Cardiovascular: Normal heart rate noted  Respiratory: Normal respiratory effort, no problems with respiration noted  Abdomen: Soft, gravid, appropriate for gestational age. Pain/Pressure: Absent     Pelvic:  Cervical exam deferred        Extremities: Normal range of motion.  Edema: None  Mental Status: Normal mood and affect. Normal behavior. Normal judgment and thought content.   Assessment and Plan:  Pregnancy: [id number] at 164w1d  1. History of HELLP syndrome, currently pregnant   2. Supervision of high risk pregnancy, antepartum   3. AMA (advanced maternal age) multigravida 35+, second trimester   4. VBAC (vaginal birth after Cesarean) - Desires TOLAC. Consent signed.   5. Sickle cell trait (HCC)  - Culture, OB Urine  6. Pregnancy related  nausea, antepartum  - ondansetron (ZOFRAN) 4 MG tablet; Take 1 tablet (4 mg total) by mouth every 8 (eight) hours as needed for nausea or vomiting.  Dispense: 20 tablet; Refill: 1  7. Gastroesophageal reflux during pregnancy in third trimester, antepartum  - famotidine (PEPCID) 20 MG tablet; Take 1 tablet (20 mg total) by mouth 2 (two) times daily.  Dispense: 60 tablet; Refill: 3  Preterm labor symptoms and general obstetric precautions including but not limited to vaginal bleeding, contractions, leaking of fluid and fetal movement were reviewed in detail with the patient. Please refer to After Visit Summary for other counseling recommendations.  Return in about 2 weeks (around 08/21/2016) for ROB.   Dorathy KinsmanVirginia Crislyn Willbanks, CNM
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
## 2016-08-08 NOTE — MAU Note (Addendum)
Pt was feeling like she had a fever and headache. Started feeling ill at 2am. Was at the clinic yesterday, called them today and they told her to come here.  Having generalized pain

## 2016-08-08 NOTE — Discharge Instructions (Signed)
Influenza, Adult Influenza, more commonly known as the flu, is a viral infection that primarily affects the respiratory tract. The respiratory tract includes organs that help you breathe, such as the lungs, nose, and throat. The flu causes many common cold symptoms, as well as a high fever and body aches. The flu spreads easily from person to person (is contagious). Getting a flu shot (influenza vaccination) every year is the best way to prevent influenza. What are the causes? Influenza is caused by a virus. You can catch the virus by:  Breathing in droplets from an infected person's cough or sneeze.  Touching something that was recently contaminated with the virus and then touching your mouth, nose, or eyes. What increases the risk? The following factors may make you more likely to get the flu:  Not cleaning your hands frequently with soap and water or alcohol-based hand sanitizer.  Having close contact with many people during cold and flu season.  Touching your mouth, eyes, or nose without washing or sanitizing your hands first.  Not drinking enough fluids or not eating a healthy diet.  Not getting enough sleep or exercise.  Being under a high amount of stress.  Not getting a yearly (annual) flu shot. You may be at a higher risk of complications from the flu, such as a severe lung infection (pneumonia), if you:  Are over the age of 49.  Are pregnant.  Have a weakened disease-fighting system (immune system). You may have a weakened immune system if you:  Have HIV or AIDS.  Are undergoing chemotherapy.  Aretaking medicines that reduce the activity of (suppress) the immune system.  Have a long-term (chronic) illness, such as heart disease, kidney disease, diabetes, or lung disease.  Have a liver disorder.  Are obese.  Have anemia. What are the signs or symptoms? Symptoms of this condition typically last 4-10 days and may include:  Fever.  Chills.  Headache, body  aches, or muscle aches.  Sore throat.  Cough.  Runny or congested nose.  Chest discomfort and cough.  Poor appetite.  Weakness or tiredness (fatigue).  Dizziness.  Nausea or vomiting. How is this diagnosed? This condition may be diagnosed based on your medical history and a physical exam. Your health care provider may do a nose or throat swab test to confirm the diagnosis. How is this treated? If influenza is detected early, you can be treated with antiviral medicine that can reduce the length of your illness and the severity of your symptoms. This medicine may be given by mouth (orally) or through an IV tube that is inserted in one of your veins. The goal of treatment is to relieve symptoms by taking care of yourself at home. This may include taking over-the-counter medicines, drinking plenty of fluids, and adding humidity to the air in your home. In some cases, influenza goes away on its own. Severe influenza or complications from influenza may be treated in a hospital. Follow these instructions at home:  Take over-the-counter and prescription medicines only as told by your health care provider.  Use a cool mist humidifier to add humidity to the air in your home. This can make breathing easier.  Rest as needed.  Drink enough fluid to keep your urine clear or pale yellow.  Cover your mouth and nose when you cough or sneeze.  Wash your hands with soap and water often, especially after you cough or sneeze. If soap and water are not available, use hand sanitizer.  Stay home from  work or school as told by your health care provider. Unless you are visiting your health care provider, try to avoid leaving home until your fever has been gone for 24 hours without the use of medicine.  Keep all follow-up visits as told by your health care provider. This is important. How is this prevented?  Getting an annual flu shot is the best way to avoid getting the flu. You may get the flu shot  in late summer, fall, or winter. Ask your health care provider when you should get your flu shot.  Wash your hands often or use hand sanitizer often.  Avoid contact with people who are sick during cold and flu season.  Eat a healthy diet, drink plenty of fluids, get enough sleep, and exercise regularly. Contact a health care provider if:  You develop new symptoms.  You have:  Chest pain.  Diarrhea.  A fever.  Your cough gets worse.  You produce more mucus.  You feel nauseous or you vomit. Get help right away if:  You develop shortness of breath or difficulty breathing.  Your skin or nails turn a bluish color.  You have severe pain or stiffness in your neck.  You develop a sudden headache or sudden pain in your face or ear.  You cannot stop vomiting. This information is not intended to replace advice given to you by your health care provider. Make sure you discuss any questions you have with your health care provider. Document Released: 06/27/2000 Document Revised: 12/06/2015 Document Reviewed: 04/24/2015 Elsevier Interactive Patient Education  2017 Elsevier Inc[employer] SAFE MEDICATIONS IN PREGNANCY  Acne:  Benzoyl Peroxide  Salicylic Acid   Backache/Headache:  Tylenol: 2 regular strength every 4 hours OR        2 Extra strength every 6 hours   Colds/Coughs/Allergies:  Benadryl (alcohol free) 25 mg every 6 hours as needed  Breath right strips  Claritin  Cepacol throat lozenges  Chloraseptic throat spray  Cold-Eeze- up to three times per day  Cough drops, alcohol free  Flonase (by prescription only)  Guaifenesin  Mucinex  Robitussin DM (plain only, alcohol free)  Saline nasal spray/drops  Sudafed (pseudoephedrine) & Actifed * use only after [redacted] weeks gestation and if you do not have high blood pressure  Tylenol  Vicks Vaporub  Zinc lozenges  Zyrtec   Constipation:  Colace  Ducolax suppositories  Fleet enema  Glycerin suppositories  Metamucil    Milk of magnesia  Miralax  Senokot  Smooth move tea   Diarrhea:  Kaopectate  Imodium A-D   *NO pepto Bismol   Hemorrhoids:  Anusol  Anusol HC  Preparation H  Tucks   Indigestion:  Tums  Maalox  Mylanta  Zantac  Pepcid   Insomnia:  Benadryl (alcohol free) 25mg  every 6 hours as needed  Tylenol PM  Unisom, no Gelcaps   Leg Cramps:  Tums  MagGel   Nausea/Vomiting:  Bonine  Dramamine  Emetrol  Ginger extract  Sea bands  Meclizine  Nausea medication to take during pregnancy:  Unisom (doxylamine succinate 25 mg tablets) Take one tablet daily at bedtime. If symptoms are not adequately controlled, the dose can be increased to a maximum recommended dose of two tablets daily (1/2 tablet in the morning, 1/2 tablet mid-afternoon and one at bedtime).  Vitamin B6 100mg  tablets. Take one tablet twice a day (up to 200 mg per day).   Skin Rashes:  Aveeno products  Benadryl cream or 25mg  every 6 hours as needed  Calamine Lotion  1% cortisone cream   Yeast infection:  Gyne-lotrimin 7  Monistat 7    **If taking multiple medications, please check labels to avoid duplicating the same active ingredients  **take medication as directed on the label  ** Do not exceed 4000 mg of tylenol in 24 hours  **Do not take medications that contain aspirin or ibuprofen

## 2016-08-08 NOTE — MAU Note (Signed)
Urine sent to lab @ 0940

## 2016-08-25 ENCOUNTER — Ambulatory Visit (INDEPENDENT_AMBULATORY_CARE_PROVIDER_SITE_OTHER): Payer: Medicaid Other | Admitting: Obstetrics and Gynecology

## 2016-08-25 VITALS — BP 111/76 | HR 86 | Wt 133.0 lb

## 2016-08-25 DIAGNOSIS — D573 Sickle-cell trait: Secondary | ICD-10-CM

## 2016-08-25 DIAGNOSIS — Z8709 Personal history of other diseases of the respiratory system: Secondary | ICD-10-CM | POA: Insufficient documentation

## 2016-08-25 DIAGNOSIS — O34219 Maternal care for unspecified type scar from previous cesarean delivery: Secondary | ICD-10-CM

## 2016-08-25 DIAGNOSIS — O09523 Supervision of elderly multigravida, third trimester: Secondary | ICD-10-CM

## 2016-08-25 DIAGNOSIS — O09522 Supervision of elderly multigravida, second trimester: Secondary | ICD-10-CM

## 2016-08-25 DIAGNOSIS — D696 Thrombocytopenia, unspecified: Secondary | ICD-10-CM | POA: Insufficient documentation

## 2016-08-25 DIAGNOSIS — O99013 Anemia complicating pregnancy, third trimester: Secondary | ICD-10-CM

## 2016-08-25 DIAGNOSIS — O99113 Other diseases of the blood and blood-forming organs and certain disorders involving the immune mechanism complicating pregnancy, third trimester: Secondary | ICD-10-CM

## 2016-08-25 DIAGNOSIS — O0993 Supervision of high risk pregnancy, unspecified, third trimester: Secondary | ICD-10-CM

## 2016-08-25 DIAGNOSIS — O09293 Supervision of pregnancy with other poor reproductive or obstetric history, third trimester: Secondary | ICD-10-CM

## 2016-08-25 DIAGNOSIS — O09299 Supervision of pregnancy with other poor reproductive or obstetric history, unspecified trimester: Secondary | ICD-10-CM

## 2016-08-25 DIAGNOSIS — Z789 Other specified health status: Secondary | ICD-10-CM

## 2016-08-25 LAB — CBC
HEMATOCRIT: 36.7 % (ref 35.0–45.0)
HEMOGLOBIN: 11.9 g/dL (ref 11.7–15.5)
MCH: 23.8 pg — ABNORMAL LOW (ref 27.0–33.0)
MCHC: 32.4 g/dL (ref 32.0–36.0)
MCV: 73.3 fL — AB (ref 80.0–100.0)
Platelets: 198 10*3/uL (ref 140–400)
RBC: 5.01 MIL/uL (ref 3.80–5.10)
RDW: 16.2 % — ABNORMAL HIGH (ref 11.0–15.0)
WBC: 8.4 10*3/uL (ref 3.8–10.8)

## 2016-08-25 MED ORDER — ASPIRIN EC 81 MG PO TBEC: 81.0000 mg | DELAYED_RELEASE_TABLET | Freq: Every day | ORAL | 1 refills | Status: DC

## 2016-08-25 NOTE — Progress Notes (Signed)
Patient requests refill on baby aspirin

## 2016-08-25 NOTE — Progress Notes (Signed)
Prenatal Visit Note Date: 08/25/2016 Clinic: [facility]  Subjective:  Sabrina Daniels is a 36 y.o. [id number] at 7118w4d being seen today for ongoing prenatal care.  She is currently monitored for the following issues for this high-risk pregnancy and has History of HELLP syndrome, currently pregnant; Sickle cell trait (HCC); Supervision of high-risk pregnancy; VBAC (vaginal birth after Cesarean); Language barrier; AMA (advanced maternal age) multigravida 35+, second trimester; Gestational thrombocytopenia without hemorrhage in third trimester (HCC); and History of influenza on her problem list.  Patient reports no complaints.   Contractions: Irritability. Vag. Bleeding: None.  Movement: Present. Denies leaking of fluid.   The following portions of the patient's history were reviewed and updated as appropriate: allergies, current medications, past family history, past medical history, past social history, past surgical history and problem list. Problem list updated.  Objective:   Vitals:   08/25/16 1355  BP: 111/76  Pulse: 86  Weight: 133 lb (60.3 kg)    Fetal Status: Fetal Heart Rate (bpm): 151 Fundal Height: 33 cm Movement: Present  Presentation: Vertex  General:  Alert, oriented and cooperative. Patient is in no acute distress.  Skin: Skin is warm and dry. No rash noted.   Cardiovascular: Normal heart rate noted  Respiratory: Normal respiratory effort, no problems with respiration noted  Abdomen: Soft, gravid, appropriate for gestational age. Pain/Pressure: Absent     Pelvic:  Cervical exam deferred        Extremities: Normal range of motion.  Edema: None  Mental Status: Normal mood and affect. Normal behavior. Normal judgment and thought content.   Urinalysis:      Assessment and Plan:  Pregnancy: [id number] at 6418w4d  1. Supervision of high risk pregnancy in third trimester Routine care. D/w her re: contraception. Will think about BTL. Pt knows will need to sign form  at next visit if she is considering  2. AMA (advanced maternal age) multigravida 35+, second trimester No issues. See prior notes  3. Gestational thrombocytopenia without hemorrhage in third trimester (HCC) Recheck today. Noted at her MAU flu dx visit - CBC  4. History of influenza No sequelae. Pt feels well  5. History of HELLP syndrome, currently pregnant Continue with baby asa.   6. Language barrier Interpreter used.   7. Sickle cell trait (HCC) Negative third trimester ucx already  8. VBAC (vaginal birth after Cesarean) Interested. Consent signed.   Preterm labor symptoms and general obstetric precautions including but not limited to vaginal bleeding, contractions, leaking of fluid and fetal movement were reviewed in detail with the patient. Please refer to After Visit Summary for other counseling recommendations.  Return in about 1 week (around 09/01/2016) for rob.   [place]arl[place] Diona Peregoy, MD

## 2016-09-10 ENCOUNTER — Encounter: Payer: Self-pay | Admitting: Advanced Practice Midwife

## 2016-09-10 ENCOUNTER — Ambulatory Visit (INDEPENDENT_AMBULATORY_CARE_PROVIDER_SITE_OTHER): Payer: Medicaid Other | Admitting: Advanced Practice Midwife

## 2016-09-10 VITALS — BP 115/76 | HR 90 | Wt 135.0 lb

## 2016-09-10 DIAGNOSIS — O09299 Supervision of pregnancy with other poor reproductive or obstetric history, unspecified trimester: Secondary | ICD-10-CM

## 2016-09-10 DIAGNOSIS — D573 Sickle-cell trait: Secondary | ICD-10-CM

## 2016-09-10 DIAGNOSIS — Z603 Acculturation difficulty: Secondary | ICD-10-CM

## 2016-09-10 DIAGNOSIS — O09522 Supervision of elderly multigravida, second trimester: Secondary | ICD-10-CM

## 2016-09-10 DIAGNOSIS — O34219 Maternal care for unspecified type scar from previous cesarean delivery: Secondary | ICD-10-CM

## 2016-09-10 DIAGNOSIS — Z789 Other specified health status: Secondary | ICD-10-CM

## 2016-09-10 DIAGNOSIS — O09292 Supervision of pregnancy with other poor reproductive or obstetric history, second trimester: Secondary | ICD-10-CM

## 2016-09-10 DIAGNOSIS — O0993 Supervision of high risk pregnancy, unspecified, third trimester: Secondary | ICD-10-CM

## 2016-09-10 DIAGNOSIS — O99013 Anemia complicating pregnancy, third trimester: Secondary | ICD-10-CM

## 2016-09-10 NOTE — Patient Instructions (Signed)
Cc bi?n php trnh [place] (Contraception Choices) [place] thai (ki?m sot sinh s?n) l vi?c s? d?ng b?t k? ph??ng php ho?c d?ng c? no ?? trnh [place]. D??i ?y l m?t s? ph??ng php trnh [place]. PH??NG PHP HOC MN  C?y ghp trnh [place]. ?y l m?t ?ng m?ng b?ng nh?a ch?a hoc mn progesterone. N khng ch?a hoc mn estrogen. Chuyn gia ch?m [place] s?c kh?e c?a qu v? s? lu?n ?ng vo ph?n bn trong c?a cnh tay pha trn. ?ng c th? v?n ? ? cho ??n 3 n?m. Sau 3 n?m, ?ng c?y ghp ph?i ???c tho ra. ?ng c?y ghp ng?n khng cho bu?ng tr?ng nh? tr?ng ra (r?ng tr?ng), lm ??c ch?t nh?y ? c? t? cung ?? ng?n khng cho tinh trng xm nh?p vo t? cung, v lm m?ng nim m?c bn trong t? cung.  Tim ch? ring progesterone. Chuyn gia ch?m [place] s?c kh?e s? tim cho qu v? 3 thng m?t l?n ?? trnh [place]. Hoc mn progesterone t?ng h?p ny d?ng vi?c r?ng tr?ng t? bu?ng tr?ng. N c?ng lm ??c ch?t nh?y c? t? cung v thay ??i n?i m?c t? cung. ?i?u ny lm cho tinh trng kh t?n t?i h?n trong t? cung.  Vin thu?c trnh [place]. Nh?ng vin thu?c ny ch?a hoc mn estrogen v progesterone. Chng ho?t ??ng b?ng cch ng?n khng cho bu?ng tr?ng gi?i phng tr?ng (r?ng tr?ng). Chng c?ng lm ch?t nh?y ? c? t? cung ??c l?i, t? ? ng?n c?n tinh trng xm nh?p vo t? cung. Thu?c trnh [place] ???c chuyn gia ch?m [place] s?c kh?e k ??n.Thu?c trnh [place] c?ng c th? ???c dng ?? ?i?u tr? rong kinh.  Vin thu?c minipil. Lo?i vin trnh [place] ny ch? ch?a hoc mn progesterone. Chng ???c dng hng ngy c?a m?i thng v ph?i ???c chuyn gia ch?m [place] s?c kh?e c?a qu v? k ??n.  Mi?ng dn trnh [place]. Mi?ng dn c ch?a hoc mn t??ng t? nh? trong vin thu?c trnh [place]. N ph?i ???c thay ??i m?t l?n m?t tu?n v ph?i do chuyn gia ch?m [place] s?c kh?e k ??n.  Vng ??t m ??o. Vng ??t c ch?a hoc mn t??ng t? nh? trong vin thu?c trnh [place]. N n?m l?i trong m ??o trong 3 tu?n, ???c tho ra trong 1 tu?n, v sau ? m?t vng m?i ???c ??t l?i vo ch? c?.  B?nh nhn ph?i th?y tho?i mi khi ??t v tho vng t? m ??o.C?n ph?i c ??n thu?c c?a m?t chuyn gia ch?m  s?c kh?e.  [place] thai kh?n c?p. Thu?c trnh thai kh?n c?p ng?n ng?a vi?c mang thai sau khi quan h? tnh d?c khng c bi?n php b?o v?. Vin thu?c ny c th? u?ng ngay sau khi quan h? tnh d?c ho?c ??n 5 ngy sau khi quan h? tnh d?c khng c bi?n php b?o v?. Thu?c ny hi?u qu? nh?t khi qu v? dng thu?c s?m sau khi quan h? tnh d?c. H?u h?t cc vin thu?c trnh [place] kh?n c?p c bn m khng c?n k ??n. Ki?m tra v?i d??c s? c?a qu v?. Khng s? d?ng bi?n php trnh [place] kh?n c?p nh? l hnh th?c duy nh?t ?? ki?m sot sinh ??. PH??NG PHP MNG NG?N  Bao cao su cho nam. ?y l m?t bao m?ng (latex hay cao su) ???c ?eo vo d??ng v?t trong khi giao h?p. N c th? ???c s? d?ng v?i ch?t di?t tinh trng ?? t?ng hi?u qu?[place]uanne Bras cao su cho n?. ?  y l m?t mng m?m, r?ng ???c ??a vo m ??o tr??c khi giao h?p.  Mng ch?n. ?y l m?t mng ch?n m?m b?ng latex, c hnh vm m ph?i ???c m?t chuyn gia ch?m [place] s?c kh?e ??t. N ???c ??t vo m ??o, cng v?i m?t ch?t th?ch ?? di?t tinh trng. N ???c ??t vo tr??c khi giao h?p. Mng ch?n nn ?? trong m ??o trong 6-8 gi? sau khi giao h?p.  M? c? t? cung. ?y l m?t c?c hnh trn, m?m b?ng latex ??t ln c? t? cung v ph?i ???c m?t chuyn gia ch?m [place] s?c kh?e ??t. M? ny c th? ???c gi? ? t?i ch? cho ??n 48 gi? sau khi giao h?p.  Mi?ng x?p. ?y l m?t mi?ng x?p polyurethane m?m. Mi?ng x?p c ch?t di?t tinh trng trong ?. N ???c ??a vo m ??o sau khi lm ??t v tr??c khi giao h?p.  Ch?t di?t tinh trng. ?y l nh?ng ha ch?t di?t ho?c ng?n khng cho tinh trng xm nh?p vo c? t? cung v t? cung. Chng c d??i d?ng kem, th?ch, thu?c ??n, b?t, ho?c vin. Nh?ng ch?t ny khng c?n k ??n thu?c. Chng ???c ??a vo m ??o b?ng m?t d?ng c? bi tr??c khi giao h?p. Qu trnh ny ph?i ???c l?p l?i m?i khi qu v? giao h?p. TRNH THAI TRONG T? CUNG  Vng trnh [place]  (IUD). ?y l m?t d?ng c? hnh ch? T ???c ??t vo t? cung c?a ng??i ph? n? trong k? kinh nguy?t ?? trnh [place]. C 2 lo?i vng trnh thai:  IUD b?ng ??ng. ?y l lo?i IUD ???c b?c trong dy ??ng v ???c ??t bn trong t? cung. ??ng lm cho t? cung v ?ng d?n tr?ng s?n xu?t ra m?t ch?t l?ng tiu di?t tinh trng. N c th? ? l?i t?i ch? trong 10 n?m.  IUD hoc mn. ?y l lo?i IUD ch?a progestin hoc mn (progesterone t?ng h?p). Hoc mn s? lm ??c ch?t nh?y c? t? cung v ng?n khng cho tinh trng xm nh?p vo t? cung, v n c?ng lm m?ng nim m?c t? cung ?? ng?n ch?n vi?c c?y tr?ng ? th? tinh. Hoc mn c th? lm suy y?u ho?c tiu di?t tinh trng xm nh?p vo t? cung. N c th? ? l?i t?i ch? trong 3-5 n?m, ty thu?c vo lo?i vng trnh [place] ???c s? d?ng. CC PH??NG PHP TRNH THAI V?NH VI?N  Th?t ?ng d?n tr?ng ? n?. ? l khi ?ng d?n tr?ng c?a ng??i ph? n? ???c b?t, th?t, ho?c ?ng l?i b?ng ph?u thu?t ?? ng?n khng cho tr?ng ?i vo t? cung.  Tri?t s?n b?ng n?i soi t? cung. Bi?n php ny bao g?m vi?c ??t m?t cu?n dy nh? ho?c lu?n vo t?ng ?ng d?n tr?ng. Bc s? c?a qu v? s? d?ng m?t k? thu?t g?i l n?i soi t? cung ?? lm th? thu?t ny. D?ng c? ny lm hnh thnh m s?o. ?i?u ny d?n ??n t?c ?ng d?n tr?ng v?nh vi?n, do ? tinh trng khng th? th? tinh cho tr?ng. C?n kho?ng 3 thng sau khi lm th? thu?t ?? cho cc ?ng d?n tr?ng b? t?c. Qu v? ph?i s? d?ng m?t hnh th?c trnh [place] khc trong 3 thng ny.  Tri?t s?n nam. ? l khi nam gi?i th?t cc ?ng d?n tinh (th?t ?ng d?n tinh).Th? thu?t ny ng?n khng cho tinh trng vo m ??o khi giao h?p. Sau khi lm th? thu?t, nam gi?i v?n  c th? xu?t ra ch?t l?ng (tinh d?ch). CC PH??NG PHP K? HO?CH T? NHIN  K? ho?ch ha gia ?nh t? nhin. ? l khng c giao h?p ho?c s? d?ng m?t ph??ng php mng ch?n (bao cao su, mng ng?n, m? c? t? cung) vo nh?ng ngy ng??i ph? n? c th? mang [place].  Ph??ng php theo l?ch. ?y l vi?c theo di ?? di c?a m?i chu k? kinh nguy?t  v xc ??nh khi no qu v? c th? mang [place].  Ph??ng php r?ng tr?ng. ?y l vi?c trnh giao h?p trong th?i gian r?ng tr?ng.  Ph??ng php tri?u ch?ng - nhi?t (Symptothermal). ?y l vi?c trnh giao h?p trong th?i gian r?ng tr?ng, s? d?ng m?t nhi?t k? v cc tri?u ch?ng c?a r?ng tr?ng.  Ph??ng php sau r?ng tr?ng. ?y l vi?c ??nh th?i gian giao h?p sau khi qu v? ? r?ng tr?ng. B?t k? lo?i ho?c bi?n php trnh [place] m qu v? ch?n, ?i?u quan tr?ng l qu v? ph?i s? d?ng bao cao su ?? b?o v? ch?ng l?i s? ly nhi?m c?a cc b?nh qua ???ng tnh d?c ([facility]). Ni chuy?n v?i chuyn gia ch?m [place] s?c kh?e c?a qu v? v? ph??ng php trnh [place] thch h?p nh?t cho qu v?. Thng tin ny khng nh?m m?c ?ch thay th? cho l?i khuyn m chuyn gia ch?m [place] s?c kh?e ni v?i qu v?. Hy b?o ??m qu v? ph?i th?o lu?n b?t k? v?n ?? g m qu v? c v?i chuyn gia ch?m [place] s?c kh?e c?a qu v?. Document Released: 07/27/2015 Document Revised: 07/27/2015 Document Reviewed: 12/23/2012 Elsevier Interactive Patient Education  2017 [employer].

## 2016-09-10 NOTE — Progress Notes (Signed)
   PRENATAL VISIT NOTE  Subjective:  Sabrina Daniels is a 36 y.o. [id number] at 6774w6d being seen today for ongoing prenatal care.  She is currently monitored for the following issues for this high-risk pregnancy and has History of HELLP syndrome, currently pregnant; Sickle cell trait (HCC); Supervision of high-risk pregnancy; VBAC (vaginal birth after Cesarean); Language barrier; AMA (advanced maternal age) multigravida 35+, second trimester; and History of influenza on her problem list.  Patient reports occasional contractions.  Contractions: Not present. Vag. Bleeding: None.  Movement: Present. Denies leaking of fluid.   The following portions of the patient's history were reviewed and updated as appropriate: allergies, current medications, past family history, past medical history, past social history, past surgical history and problem list. Problem list updated.  Objective:   Vitals:   09/10/16 0931  BP: 115/76  Pulse: 90  Weight: 135 lb (61.2 kg)    Fetal Status: Fetal Heart Rate (bpm): 135 Fundal Height: 34 cm Movement: Present  Presentation: Vertex  General:  Alert, oriented and cooperative. Patient is in no acute distress.  Skin: Skin is warm and dry. No rash noted.   Cardiovascular: Normal heart rate noted  Respiratory: Normal respiratory effort, no problems with respiration noted  Abdomen: Soft, gravid, appropriate for gestational age. Pain/Pressure: Absent     Pelvic:  Cervical exam deferred        Extremities: Normal range of motion.  Edema: None  Mental Status: Normal mood and affect. Normal behavior. Normal judgment and thought content.   Reviewed neg GBS  Assessment and Plan:  Pregnancy: [id number] at 4674w6d  1. Supervision of high risk pregnancy in third trimester   2. VBAC (vaginal birth after Cesarean)   3. Language barrier   4. AMA (advanced maternal age) multigravida 35+, second trimester   5. History of HELLP syndrome, currently pregnant   6. Sickle cell trait  (HCC)  Term labor symptoms and general obstetric precautions including but not limited to vaginal bleeding, contractions, leaking of fluid and fetal movement were reviewed in detail with the patient. Please refer to After Visit Summary for other counseling recommendations.  Return in about 1 week (around 09/17/2016) for ROB/GBS.   Dorathy KinsmanVirginia Devon Kingdon, CNM

## 2016-09-17 ENCOUNTER — Encounter: Payer: Self-pay | Admitting: Family Medicine

## 2016-09-17 ENCOUNTER — Other Ambulatory Visit (HOSPITAL_COMMUNITY)
Admission: RE | Admit: 2016-09-17 | Discharge: 2016-09-17 | Disposition: A | Discharge status: Home / Self Care | Payer: Medicaid Other | Source: Ambulatory Visit | Attending: Advanced Practice Midwife | Admitting: Advanced Practice Midwife

## 2016-09-17 ENCOUNTER — Ambulatory Visit (INDEPENDENT_AMBULATORY_CARE_PROVIDER_SITE_OTHER): Payer: Medicaid Other | Admitting: Advanced Practice Midwife

## 2016-09-17 VITALS — BP 119/78 | HR 90 | Wt 135.5 lb

## 2016-09-17 DIAGNOSIS — Z113 Encounter for screening for infections with a predominantly sexual mode of transmission: Secondary | ICD-10-CM | POA: Insufficient documentation

## 2016-09-17 DIAGNOSIS — O09523 Supervision of elderly multigravida, third trimester: Secondary | ICD-10-CM

## 2016-09-17 DIAGNOSIS — O0993 Supervision of high risk pregnancy, unspecified, third trimester: Secondary | ICD-10-CM

## 2016-09-17 MED ORDER — ASPIRIN EC 81 MG PO TBEC: 81.0000 mg | DELAYED_RELEASE_TABLET | Freq: Every day | ORAL | 1 refills | Status: DC

## 2016-09-17 NOTE — Progress Notes (Signed)
   PRENATAL VISIT NOTE  Subjective:  Sabrina Daniels is a 36 y.o. [id number] at 3156w6d being seen today for ongoing prenatal care.  She is currently monitored for the following issues for this high-risk pregnancy and has History of HELLP syndrome, currently pregnant; Sickle cell trait (HCC); Supervision of high-risk pregnancy; VBAC (vaginal birth after Cesarean); Language barrier; AMA (advanced maternal age) multigravida 35+, second trimester; and History of influenza on her problem list.  Patient reports no complaints.  Contractions: Not present. Vag. Bleeding: None.  Movement: Present. Denies leaking of fluid.   The following portions of the patient's history were reviewed and updated as appropriate: allergies, current medications, past family history, past medical history, past social history, past surgical history and problem list. Problem list updated.  Objective:   Vitals:   09/17/16 1400  BP: 119/78  Pulse: 90  Weight: 135 lb 8 oz (61.5 kg)    Fetal Status: Fetal Heart Rate (bpm): 160 Fundal Height: 35 cm Movement: Present     General:  Alert, oriented and cooperative. Patient is in no acute distress.  Skin: Skin is warm and dry. No rash noted.   Cardiovascular: Normal heart rate noted  Respiratory: Normal respiratory effort, no problems with respiration noted  Abdomen: Soft, gravid, appropriate for gestational age. Pain/Pressure: Absent     Pelvic:  Cervical exam performed        Extremities: Normal range of motion.  Edema: None  Mental Status: Normal mood and affect. Normal behavior. Normal judgment and thought content.   Assessment and Plan:  Pregnancy: [id number] at 5556w6d  1. Supervision of high risk pregnancy in third trimester      Reviewed signs of labor, Interpretor in room for visit  - Culture, beta strep (group b only) - GC[facility]Chlamydia probe [facility]  Term labor symptoms and general obstetric precautions including but not limited to vaginal bleeding,  contractions, leaking of fluid and fetal movement were reviewed in detail with the patient. Please refer to After Visit Summary for other counseling recommendations.  Return in about 1 week (around 09/24/2016) for [facility].   Aviva SignsMarie L Williams, CNM

## 2016-09-17 NOTE — Patient Instructions (Signed)
Ba thng th? th? ba c?a thai k? (Third Trimester of Pregnancy) Ba thng th? ba l t? tu?n 29 ??n tu?n 43, thng th? 7 ??n thng th? 9. Ba thng th? ba c?ng l th?i gian khi bo thai ?ang pht tri?n nhanh chng. Vo cu?i thng th? chn, bo thai di kho?ng 20 inch v n?ng kho?ng 6-10 pao. C? TH? THAY ??I C? th? c?a qu v? tr?i qua r?t nhi?u thay ??i trong th?i gian mang thai. Nh?ng thay ??i khc nhau ? cc ph? n? khc nhau.  Cn n?ng c?a qu v? s? ti?p t?c t?ng. Qu v? c th? t?ng ???c 25-35 pao (11 - 16 kg) vo cu?i thai k?.  Qu v? c th? b?t ??u c cc v?t n?t trn hng, b?ng v ng?c.  Qu v? c th? ?i ti?u th??ng xuyn h?n v bo thai di chuy?n xu?ng th?p h?n vo khung ch?u c?a qu v? v ? vo bng quang c?a qu v?.  Qu v? c th? b? ho?c ti?p t?c b? ? nng do vi?c mang thai.  Qu v? c th? b? to bn v m?t s? hoc mn nh?t ??nh ?ang lm cc c? c ch?c n?ng ??y ch?t th?i qua ???ng ru?t ho?t ??ng ch?m l?i.  Qu v? c th? b? b?nh tr? ho?c s?ng, ph?ng t?nh m?ch (gin t?nh m?ch).  Qu v? c th? ?au khung ch?u v t?ng cn v cc hoc mn mang thai lm l?ng kh?p n?i gi?a cc x??ng ? khung ch?u c?a qu v?. ?au l?ng c th? do s? c? g?ng qu s?c c?a c? b?p h? tr? t? th? c?a qu v?.  Qu v? c nh?ng thay ??i v? tc. Nh?ng thay ??i ny c th? bao g?m tc dy ln, tc m?c nhanh h?n v thay ??i v? k?t c?u. M?t s? ph? n? c?ng b? r?ng tc trong khi ho?c sau khi mang thai, ho?c tc c?m th?y kh ho?c m?ng. Tc c?a qu v? s? c nhi?u kh? n?ng s? tr? l?i bnh th??ng sau khi em b ???c sinh ra.  Ng?c qu v? s? ti?p t?c pht tri?n v nh?y c?m ?au. Ch?t d?ch mu vng c th? r? t? v c?a qu v? g?i l s?a non.  R?n c?a qu v? c th? l?i ra.  Qu v? c th? c?m th?y kh th? v t? cung c?a qu v? to ra.  Qu v? c th? nh?n th?y thai nhi "r?i xu?ng", ho?c di chuy?n xu?ng th?p h?n trong b?ng c?a qu v?.  Qu v? c th? b? ti?t d?ch nh?y c mu. ?i?u ny th??ng x?y ra m?t vi ngy ??n m?t tu?n tr??c khi b?t ??u  sinh ??.  C? t? cung c?a qu v? tr? nn m?ng v m?m (b? m?ng c? t? cung) g?n ngy d? sinh c?a qu v?. TRNG ??I ?I?U G TRONG NH?NG L?N KHM TR??C KHI SINH Qu v? s? ???c khm tr??c khi sinh 2 tu?n m?t l?n cho ??n tu?n 37. Sau ?, qu v? s? ???c khm hng tu?n tr??c khi sinh. Trong m?t l?n khm tr??c khi sinh:  Qu v? s? ???c cn n?ng ?? ??m b?o qu v? v thai nhi ?ang pht tri?n bnh th??ng.  Qu v? ???c ?o huy?t p.  Qu v? s? ???c ?o b?ng ?? theo di s? pht tri?n c?a b.  S? nghe th?y nh?p tim thai.  B?t k? k?t qu? ki?m tra no trong l?n khm tr??c ? s? ???c th?o lu?n.  Qu v? c   th? ???c ki?m tra c? t? cung g?n ngy d? sinh ?? xem c? t? cung ? m?ng ch?a. Lc ???c kho?ng 37 tu?n, chuyn gia ch?m sc y t? c?a qu v? s? ki?m tra c? t? cung c?a qu v?. Cng lc ?, chuyn gia ch?m sc y t? c?ng s? th?c hi?n ki?m tra ch?t ti?t ra c?a m m ??o. Ki?m tra ny l ?? xc ??nh xem c m?t lo?i vi khu?n, lin c?u nhm B, hay khng. Chuyn gia ch?m sc y t? c?a qu v? s? gi?i thch thm. Chuyn gia ch?m sc y t? c th? h?i qu v?:  K? ho?ch sinh c?a qu v? l g.  Qu v? c?m th?y th? no.  Qu v? c c?m th?y em b c? ??ng.  Li?u qu v? c b?t k? tri?u ch?ng b?t th??ng no, ch?ng h?n nh? r r? ch?t l?ng, ch?y mu, ?au ??u nghim tr?ng ho?c co th?t ? b?ng.  Li?u qu v? c ?ang s? d?ng b?t k? s?n ph?m thu?c l no, bao g?m thu?c l d?ng ht, thu?c l d?ng nhai v thu?c l ?i?n t?.  Li?u qu v? c b?t k? cu h?i no khng. Nh?ng ki?m tra ho?c sng l?c khc c th? ???c th?c hi?n trong k? ba thng th? hai c?a qu v? bao g?m:  Cc xt nghi?m mu ki?m tra n?ng ?? s?t th?p (thi?u mu).  Th? thai ?? ki?m tra s?c kh?e, m?c ?? ho?t ??ng v s? pht tri?n c?a thai nhi. Th? nghi?m ???c th?c hi?n n?u qu v? c m?t s? tnh tr?ng b?nh l, ho?c n?u c nh?ng v?n ?? trong qu trnh mang thai.  Xt nghi?m HIV (Vi rt suy gi?m mi?n d?ch ? ng??i). N?u qu v? c nguy c? cao, qu v? c th? ???c ki?m tra HIV  trong ba thng th? ba c?a k? mang thai. CHUY?N D? GI? Qu v? c th? c?m th?y co th?t nh?, khng ??u m d?n d?n s? h?t. Chng ???c g?i l c?n co th?t Braxton Hicks, hay chuy?n d? gi?. Co th?t c th? ko di trong nhi?u gi?, nhi?u ngy, ho?c th?m ch nhi?u tu?n tr??c khi chuy?n d? th?t. N?u c?n co th?t ??u ??n, t?ng c??ng, ho?c tr? nn ?au ??n, t?t nh?t l ph?i ??n g?p chuyn gia ch?m sc y t? ?? khm. CC D?U HI?U TR? D?  Co th?t gi?ng nh? khi c kinh nguy?t.  Cc c?n co th?t cch nhau 5 pht ho?c nhanh h?n.  Co th?t b?t ??u t? ??nh t? cung v lan xu?ng b?ng d??i v l?ng.  C?m gic p l?c ln khung ch?u t?ng ln ho?c ?au l?ng.  Ti?t d?ch nh?y c n??c ho?c mu t? m ??o. N?u qu v? c b?t k? nh?ng d?u hi?u ny tr??c tu?n 38 c?a thai k?, g?i cho chuyn gia ch?m sc y t? ngay l?p t?c. Qu v? s? c?n ??n b?nh vi?n ?? ???c ki?m tra ngay l?p t?c. H??NG D?N CH?M SC T?I NH  Trnh t?t c? thu?c l, th?o d??c, r??u v ma ty khng ???c k ??n. Cc ha ch?t ny ?nh h??ng ??n s? hnh thnh v pht tri?n c?a em b.  Khng s? d?ng b?t c? cc s?n ph?m thu?c l no, bao g?m thu?c l d?ng ht, thu?c l d?ng nhai v thu?c l ?i?n t?. N?u qu v? c?n gip ?? ?? cai thu?c, hy h?i chuyn gia ch?m sc s?c kh?e. Qu v? c th? nh?n ???c h? tr? t?   v?n v cc ngu?n h? tr? khc ?? gip qu v? b? thu?c l.  Tun th? cc ch? d?n c?a chuyn gia ch?m sc y t? v? vi?c s? d?ng thu?c. C nh?ng lo?i thu?c l an ton ho?c khng an ton trong qu trnh mang thai.  T?p th? d?c th??ng xuyn theo ch? d?n c?a chuyn gia ch?m sc y t?. B? co th?t t? cung l m?t d?u hi?u t?t ?? ng?ng t?p th? d?c.  Ti?p t?c ?n cc b?a ?n th??ng xuyn, lnh m?nh.  M?c m?t chi?c o ng?c nng ?? t?t cho v b? nh?y c?m ?au.  Khng s? d?ng b?n t?m n??c nng, phng xng h?i, ho?c phng t?m h?i.  ?eo dy an ton c?a qu v? m?i lc khi li xe.  Trnh th?t s?ng, ph mai ch?a n?u chn, h?p v? sinh c?a mo, v ??t v? sinh dnh cho mo. Nh?ng th? ny mang  m?m b?nh c th? gy d? t?t b?m sinh cho em b.  U?ng vitamin tr??c khi sinh c?a qu v?.  U?ng 1500-2000 mg canxi m?i ngy b?t ??u t? tu?n th? 20 c?a thai k? cho ??n khi sinh con.  Hy th? dng thu?c lm m?m phn (n?u chuyn gia ch?m sc y t? c?a qu v? ch?p thu?n) n?u qu v? b? to bn. ?n thm th?c ?n giu ch?t x?, nh? rau t??i ho?c tri cy v ng? c?c nguyn h?t. U?ng nhi?u n??c ?? gi? cho n??c ti?u trong ho?c vng nh?t.  T?m b?n ng?i ?? lm d?u c?n ?au ho?c s? kh ch?u do b?nh tr? gy ra. S? d?ng kem tr? n?u chuyn gia ch?m sc y t? ch?p thu?n.  N?u qu v? b? gin t?nh m?ch, hy ?eo t?t h? tr?. Nng cao chn c?a qu v? trong 15 pht, 3-4 l?n m?t ngy. H?n ch? mu?i trong ch? ?? ?n c?a qu v?.  Trnh nng v?t n?ng, hay ?i giy th?p gt, v luy?n t?p t? th? ph h?p.  Ngh? ng?i nhi?u v?i ?i chn c?a qu v? nng cao n?u qu v? b? chu?t rt chn ho?c ?au vng th?t l?ng.  ??n khm nha s? n?u qu v? ch?a ??n trong th?i gian qu v? mang thai. S? d?ng m?t bn ch?i m?m ?? ch?i r?ng c?a qu v? v hy nh? nhng khi dng ch? nha khoa.  C th? ???c ti?p t?c c quan h? tnh d?c tr? khi chuyn gia ch?m sc y t? yu c?u khc.  Khng ?i du l?ch xa tr? khi n l hon ton c?n thi?t v ch? ?i v?i s? ch?p thu?n c?a chuyn gia ch?m sc y t?.  Tham gia cc l?p h?c tr??c khi sinh ?? hi?u, th?c hnh, v ??t cu h?i v? tr? d? v sinh con.  Th?c hi?n m?t l?n th? t?i b?nh vi?n.  Chu?n b? ti ?? ?? ?i vi?n.  Chu?n b? phng cho em b.  Ti?p t?c ??n m?i l?n h?n khm tr??c khi sinh theo ch? d?n c?a chuyn gia ch?m sc y t? c?a qu v?.  ?I KHM N?U:  Qu v? khng ch?c ch?n li?u qu v? c tr? d? ho?c li?u qu v? ? v? ?i hay ch?a.  Qu v? b? hoa m?t.  Qu v? b? co th?t vng ch?u, p l?c ln vng ch?u, ?au m ? ? vng b?ng c?a qu v?.  Qu v? b? bu?n nn, nn m?a ho?c tiu ch?y lin t?c.  Qu v? c d?ch m ??o mi kh ch?u.    Qu v? b? ?au khi ?i ti?u.  NGAY L?P T?C ?I KHM N?U:  Qu v? b? s?t.  Qu  v? b? r r? d?ch t? m ??o.  Qu v? c ra ??m mu ho?c ch?y mu t? m ??o.  Qu v? b? co th?t nghim tr?ng ho?c ?au ? b?ng.  Qu v? gi?m cn ho?c t?ng cn nhanh.  Qu v? b? kh th? cng v?i ?au ng?c.  Qu v? th?y s?ng b?t ng? ho?c r?t to ? m?t, tay, m?t c chn, bn chn ho?c c?ng chn.  Qu v? khng th?y em b c? ??ng trong h?n m?t gi?.  Qu v? b? ?au ??u nghim tr?ng m khng h?t sau khi dng thu?c.  Qu v? b? thay ??i th? l?c.  Thng tin ny khng nh?m m?c ?ch thay th? cho l?i khuyn m chuyn gia ch?m sc s?c kh?e ni v?i qu v?. Hy b?o ??m qu v? ph?i th?o lu?n b?t k? v?n ?? g m qu v? c v?i chuyn gia ch?m sc s?c kh?e c?a qu v?. Document Released: 07/27/2015 Document Revised: 07/27/2015 Document Reviewed: 08/31/2012 Elsevier Interactive Patient Education  2017 Elsevier Inc.  

## 2016-09-18 LAB — GC/CHLAMYDIA PROBE AMP (~~LOC~~) NOT AT ARMC
Chlamydia: NEGATIVE
Neisseria Gonorrhea: NEGATIVE

## 2016-09-21 LAB — CULTURE, BETA STREP (GROUP B ONLY): Strep Gp B Culture: NEGATIVE

## 2016-09-25 ENCOUNTER — Ambulatory Visit (INDEPENDENT_AMBULATORY_CARE_PROVIDER_SITE_OTHER): Payer: Medicaid Other | Admitting: Obstetrics and Gynecology

## 2016-09-25 VITALS — BP 109/71 | HR 86 | Wt 136.7 lb

## 2016-09-25 DIAGNOSIS — O34219 Maternal care for unspecified type scar from previous cesarean delivery: Secondary | ICD-10-CM

## 2016-09-25 DIAGNOSIS — Z758 Other problems related to medical facilities and other health care: Secondary | ICD-10-CM

## 2016-09-25 DIAGNOSIS — O09523 Supervision of elderly multigravida, third trimester: Secondary | ICD-10-CM

## 2016-09-25 DIAGNOSIS — Z789 Other specified health status: Secondary | ICD-10-CM

## 2016-09-25 DIAGNOSIS — O0993 Supervision of high risk pregnancy, unspecified, third trimester: Secondary | ICD-10-CM

## 2016-09-25 NOTE — Patient Instructions (Signed)

## 2016-09-25 NOTE — Progress Notes (Signed)
Used interpreter [employer].

## 2016-09-25 NOTE — Progress Notes (Signed)
   PRENATAL VISIT NOTE  Subjective:  Sabrina Daniels is a 36 y.o. [id number] at 6771w0d being seen today for ongoing prenatal care.  She is currently monitored for the following issues for this high-risk pregnancy and has History of HELLP syndrome, currently pregnant; Sickle cell trait (HCC); Supervision of high-risk pregnancy; VBAC (vaginal birth after Cesarean); Language barrier; AMA (advanced maternal age) multigravida 35+, second trimester; and History of influenza on her problem list.  Patient reports no complaints.  Contractions: Not present. Vag. Bleeding: None.  Movement: Present. Denies leaking of fluid.   The following portions of the patient's history were reviewed and updated as appropriate: allergies, current medications, past family history, past medical history, past social history, past surgical history and problem list. Problem list updated.  Objective:   Vitals:   09/25/16 1337  BP: 109/71  Pulse: 86  Weight: 136 lb 11.2 oz (62 kg)    Fetal Status: Fetal Heart Rate (bpm): 132   Movement: Present     General:  Alert, oriented and cooperative. Patient is in no acute distress.  Skin: Skin is warm and dry. No rash noted.   Cardiovascular: Normal heart rate noted  Respiratory: Normal respiratory effort, no problems with respiration noted  Abdomen: Soft, gravid, appropriate for gestational age. Pain/Pressure: Present     Pelvic:  Cervical exam deferred        Extremities: Normal range of motion.  Edema: None  Mental Status: Normal mood and affect. Normal behavior. Normal judgment and thought content.   Assessment and Plan:  Pregnancy: [id number] at 3971w0d  1. Supervision of high risk pregnancy in third trimester -up todate on labs -GBS negative -restriction letter given for work  2. VBAC (vaginal birth after Cesarean) -patient with c-section for HELPP at approx 20 wks.  -2 successful VBACs since -Consent signed  3. Language barrier In person interpreter used  Term labor  symptoms and general obstetric precautions including but not limited to vaginal bleeding, contractions, leaking of fluid and fetal movement were reviewed in detail with the patient. Please refer to After Visit Summary for other counseling recommendations.  Return in about 1 week (around 10/02/2016).   Lorne SkeensNicholas Michael Schenk, MD

## 2016-09-28 LAB — OB RESULTS CONSOLE GBS: GBS: NEGATIVE

## 2016-10-01 ENCOUNTER — Ambulatory Visit (INDEPENDENT_AMBULATORY_CARE_PROVIDER_SITE_OTHER): Payer: Medicaid Other | Admitting: Advanced Practice Midwife

## 2016-10-01 VITALS — BP 116/74 | HR 85 | Wt 137.2 lb

## 2016-10-01 DIAGNOSIS — D573 Sickle-cell trait: Secondary | ICD-10-CM

## 2016-10-01 DIAGNOSIS — O99013 Anemia complicating pregnancy, third trimester: Secondary | ICD-10-CM

## 2016-10-01 DIAGNOSIS — O34219 Maternal care for unspecified type scar from previous cesarean delivery: Secondary | ICD-10-CM

## 2016-10-01 DIAGNOSIS — O09523 Supervision of elderly multigravida, third trimester: Secondary | ICD-10-CM

## 2016-10-01 DIAGNOSIS — Z758 Other problems related to medical facilities and other health care: Secondary | ICD-10-CM

## 2016-10-01 DIAGNOSIS — O09522 Supervision of elderly multigravida, second trimester: Secondary | ICD-10-CM

## 2016-10-01 DIAGNOSIS — O0993 Supervision of high risk pregnancy, unspecified, third trimester: Secondary | ICD-10-CM

## 2016-10-01 DIAGNOSIS — Z789 Other specified health status: Secondary | ICD-10-CM

## 2016-10-01 NOTE — Progress Notes (Signed)
   PRENATAL VISIT NOTE  Subjective:  Sabrina Daniels is a 36 y.o. [id number] at 3748w6d being seen today for ongoing prenatal care.  She is currently monitored for the following issues for this high-risk pregnancy and has History of HELLP syndrome, currently pregnant; Sickle cell trait (HCC); Supervision of high-risk pregnancy; VBAC (vaginal birth after Cesarean); Language barrier; AMA (advanced maternal age) multigravida 35+, second trimester; and History of influenza on her problem list.  Patient reports no complaints.  Contractions: Not present. Vag. Bleeding: None.  Movement: Present. Denies leaking of fluid.   The following portions of the patient's history were reviewed and updated as appropriate: allergies, current medications, past family history, past medical history, past social history, past surgical history and problem list. Problem list updated.  Objective:   Vitals:   10/01/16 1343  BP: 116/74  Pulse: 85  Weight: 137 lb 3.2 oz (62.2 kg)    Fetal Status: Fetal Heart Rate (bpm): 128   Movement: Present     General:  Alert, oriented and cooperative. Patient is in no acute distress.  Skin: Skin is warm and dry. No rash noted.   Cardiovascular: Normal heart rate noted  Respiratory: Normal respiratory effort, no problems with respiration noted  Abdomen: Soft, gravid, appropriate for gestational age. Pain/Pressure: Present     Pelvic:  Cervical exam deferred        Extremities: Normal range of motion.  Edema: Trace  Mental Status: Normal mood and affect. Normal behavior. Normal judgment and thought content.   Assessment and Plan:  Pregnancy: [id number] at 2948w6d  1. Sickle cell trait (HCC)   2. Supervision of high risk pregnancy in third trimester     Reviewed signs of labor  3. VBAC (vaginal birth after Cesarean)   4. Language barrier     In person interpretor used  5. AMA (advanced maternal age) multigravida 35+, second trimester   Term labor symptoms and general obstetric  precautions including but not limited to vaginal bleeding, contractions, leaking of fluid and fetal movement were reviewed in detail with the patient. Please refer to After Visit Summary for other counseling recommendations.  RTO 1 week  Sabrina Daniels, CNM

## 2016-10-01 NOTE — Patient Instructions (Signed)
Trial of Labor After Cesarean Delivery A trial of labor after cesarean delivery (TOLAC) is when a woman tries to give birth vaginally after a previous cesarean delivery. TOLAC may be a safe and appropriate option for you depending on your medical history and other risk factors. When TOLAC is successful and you are able to have a vaginal delivery, this is called a vaginal birth after cesarean delivery (VBAC). Candidates for TOLAC TOLAC is possible for some women who:  Have undergone one or two prior cesarean deliveries in which the incision of the uterus was horizontal (low transverse).  Are carrying twins and have had one prior low transverse incision during a cesarean delivery.  Do not have a vertical (classical) uterine scar.  Have not had a tear in the wall of their uterus (uterine rupture). TOLAC is also supported for women who meet appropriate criteria and:  Are under the age of 40 years.  Are tall and have a body mass index (BMI) of less than 30.  Have an unknown uterine scar.  Give birth in a facility equipped to handle an emergency cesarean delivery. This team should be able to handle possible complications such as a uterine rupture.  Have thorough counseling about the benefits and risks of TOLAC.  Have discussed future pregnancy plans with their health care provider.  Plan to have several more pregnancies. Most successful candidates for TOLAC:  Have had a successful vaginal delivery before or after their cesarean delivery.  Experience labor that begins naturally on or before the due date (40 weeks of gestation).  Do not have a very large (macrosomic) baby.  Had a prior cesarean delivery but are not currently experiencing factors that would prompt a cesarean delivery (such as a breech position).  Had only one prior cesarean delivery.  Had a prior cesarean delivery that was performed early in labor and not after full cervical dilation. TOLAC may be most appropriate for  women who meet the above guidelines and who plan to have more pregnancies. TOLAC is not recommended for home births. Least successful candidates for TOLAC:  Have an induced labor with an unfavorable cervix. An unfavorable cervix is when the cervix is not dilating enough (among other factors).  Have never had a vaginal delivery.  Have had more than two cesarean deliveries.  Have a pregnancy at more than 40 weeks of gestation.  Are pregnant with a baby with a suspected weight greater than 4,000 grams (8 pounds) and who have no prior history of a vaginal delivery.  Have closely spaced pregnancies. Suggested benefits of TOLAC  You may have a faster recovery time.  You may have a shorter stay in the hospital.  You may have less pain and fewer problems than with a cesarean delivery. Women who have a cesarean delivery have a higher chance of needing blood or getting a fever, an infection, or a blood clot in the legs. Suggested risks of TOLAC The highest risk of complications happens to women who attempt a TOLAC and fail. A failed TOLAC results in an unplanned cesarean delivery. Risks related to [facility] or repeat cesarean deliveries include:  Blood loss.  Infection.  Blood clot.  Injury to surrounding tissues or organs.  Having to remove the uterus (hysterectomy).  Potential problems with the placenta (such as placenta previa or placenta accreta) in future pregnancies. Although very rare, the main concerns with TOLAC are:  Rupture of the uterine scar from a past cesarean delivery.  Needing an emergency cesarean delivery.  Having a bad outcome for the baby (perinatal morbidity). Where to find more information:  American Congress of Obstetricians and Gynecologists: www.acog.org  [employer] Nurse-Midwives: www.midwife.org This information is not intended to replace advice given to you by your health care provider. Make sure you discuss any questions you have with your  health care provider. Document Released: 03/18/2011 Document Revised: 05/28/2016 Document Reviewed: 12/20/2012 Elsevier Interactive Patient Education  2017 [employer].

## 2016-10-09 ENCOUNTER — Ambulatory Visit (INDEPENDENT_AMBULATORY_CARE_PROVIDER_SITE_OTHER): Payer: Medicaid Other | Admitting: Obstetrics and Gynecology

## 2016-10-09 VITALS — BP 114/75 | HR 86 | Wt 138.7 lb

## 2016-10-09 DIAGNOSIS — O34219 Maternal care for unspecified type scar from previous cesarean delivery: Secondary | ICD-10-CM

## 2016-10-09 DIAGNOSIS — O0993 Supervision of high risk pregnancy, unspecified, third trimester: Secondary | ICD-10-CM

## 2016-10-09 NOTE — Progress Notes (Signed)
   PRENATAL VISIT NOTE  Subjective:  Sabrina Daniels is a 36 y.o. [id number] at 3358w0d being seen today for ongoing prenatal care.  She is currently monitored for the following issues for this high-risk pregnancy and has History of HELLP syndrome, currently pregnant; Sickle cell trait (HCC); Supervision of high-risk pregnancy; VBAC (vaginal birth after Cesarean); Language barrier; AMA (advanced maternal age) multigravida 35+, second trimester; and History of influenza on her problem list.  Patient reports no complaints.  Contractions: Not present. Vag. Bleeding: None.  Movement: Present. Denies leaking of fluid.   The following portions of the patient's history were reviewed and updated as appropriate: allergies, current medications, past family history, past medical history, past social history, past surgical history and problem list. Problem list updated.  Objective:   Vitals:   10/09/16 1319  BP: 114/75  Pulse: 86  Weight: 138 lb 11.2 oz (62.9 kg)    Fetal Status: Fetal Heart Rate (bpm): 145   Movement: Present     General:  Alert, oriented and cooperative. Patient is in no acute distress.  Skin: Skin is warm and dry. No rash noted.   Cardiovascular: Normal heart rate noted  Respiratory: Normal respiratory effort, no problems with respiration noted  Abdomen: Soft, gravid, appropriate for gestational age. Pain/Pressure: Present     Pelvic:  Cervical exam deferred        Extremities: Normal range of motion.  Edema: None  Mental Status: Normal mood and affect. Normal behavior. Normal judgment and thought content.   Assessment and Plan:  Pregnancy: [id number] at 5958w0d  1. Supervision of high risk pregnancy in third trimester Doing well - occasional contractions GBS negative No other concerns  2. VBAC (vaginal birth after Cesarean) Consent signed.  Term labor symptoms and general obstetric precautions including but not limited to vaginal bleeding, contractions, leaking of fluid and fetal  movement were reviewed in detail with the patient. Please refer to After Visit Summary for other counseling recommendations.  Return in about 1 week (around 10/16/2016).   Lorne SkeensNicholas Michael Ahmaud Duthie, MD

## 2016-10-09 NOTE — Patient Instructions (Signed)
Third Trimester of Pregnancy The third trimester is from week 28 through week 40 (months 7 through 9). The third trimester is a time when the unborn baby (fetus) is growing rapidly. At the end of the ninth month, the fetus is about 20 inches in length and weighs 6-10 pounds. Body changes during your third trimester Your body will continue to go through many changes during pregnancy. The changes vary from woman to woman. During the third trimester:  Your weight will continue to increase. You can expect to gain 25-35 pounds (11-16 kg) by the end of the pregnancy.  You may begin to get stretch marks on your hips, abdomen, and breasts.  You may urinate more often because the fetus is moving lower into your pelvis and pressing on your bladder.  You may develop or continue to have heartburn. This is caused by increased hormones that slow down muscles in the digestive tract.  You may develop or continue to have constipation because increased hormones slow digestion and cause the muscles that push waste through your intestines to relax.  You may develop hemorrhoids. These are swollen veins (varicose veins) in the rectum that can itch or be painful.  You may develop swollen, bulging veins (varicose veins) in your legs.  You may have increased body aches in the pelvis, back, or thighs. This is due to weight gain and increased hormones that are relaxing your joints.  You may have changes in your hair. These can include thickening of your hair, rapid growth, and changes in texture. Some women also have hair loss during or after pregnancy, or hair that feels dry or thin. Your hair will most likely return to normal after your baby is born.  Your breasts will continue to grow and they will continue to become tender. A yellow fluid (colostrum) may leak from your breasts. This is the first milk you are producing for your baby.  Your belly button may stick out.  You may notice more swelling in your hands,  face, or ankles.  You may have increased tingling or numbness in your hands, arms, and legs. The skin on your belly may also feel numb.  You may feel short of breath because of your expanding uterus.  You may have more problems sleeping. This can be caused by the size of your belly, increased need to urinate, and an increase in your body's metabolism.  You may notice the fetus "dropping," or moving lower in your abdomen (lightening).  You may have increased vaginal discharge.  You may notice your joints feel loose and you may have pain around your pelvic bone.  What to expect at prenatal visits You will have prenatal exams every 2 weeks until week 36. Then you will have weekly prenatal exams. During a routine prenatal visit:  You will be weighed to make sure you and the baby are growing normally.  Your blood pressure will be taken.  Your abdomen will be measured to track your baby's growth.  The fetal heartbeat will be listened to.  Any test results from the previous visit will be discussed.  You may have a cervical check near your due date to see if your cervix has softened or thinned (effaced).  You will be tested for Group B streptococcus. This happens between 35 and 37 weeks.  Your health care provider may ask you:  What your birth plan is.  How you are feeling.  If you are feeling the baby move.  If you have had   any abnormal symptoms, such as leaking fluid, bleeding, severe headaches, or abdominal cramping.  If you are using any tobacco products, including cigarettes, chewing tobacco, and electronic cigarettes.  If you have any questions.  Other tests or screenings that may be performed during your third trimester include:  Blood tests that check for low iron levels (anemia).  Fetal testing to check the health, activity level, and growth of the fetus. Testing is done if you have certain medical conditions or if there are problems during the  pregnancy.  Nonstress test (NST). This test checks the health of your baby to make sure there are no signs of problems, such as the baby not getting enough oxygen. During this test, a belt is placed around your belly. The baby is made to move, and its heart rate is monitored during movement.  What is false labor? False labor is a condition in which you feel small, irregular tightenings of the muscles in the womb (contractions) that usually go away with rest, changing position, or drinking water. These are called Braxton Hicks contractions. Contractions may last for hours, days, or even weeks before true labor sets in. If contractions come at regular intervals, become more frequent, increase in intensity, or become painful, you should see your health care provider. What are the signs of labor?  Abdominal cramps.  Regular contractions that start at 10 minutes apart and become stronger and more frequent with time.  Contractions that start on the top of the uterus and spread down to the lower abdomen and back.  Increased pelvic pressure and dull back pain.  A watery or bloody mucus discharge that comes from the vagina.  Leaking of amniotic fluid. This is also known as your "water breaking." It could be a slow trickle or a gush. Let your health care provider know if it has a color or strange odor. If you have any of these signs, call your health care provider right away, even if it is before your due date. Follow these instructions at home: Medicines  Follow your health care provider's instructions regarding medicine use. Specific medicines may be either safe or unsafe to take during pregnancy.  Take a prenatal vitamin that contains at least 600 micrograms (mcg) of folic acid.  If you develop constipation, try taking a stool softener if your health care provider approves. Eating and drinking  Eat a balanced diet that includes fresh fruits and vegetables, whole grains, good sources of protein  such as meat, eggs, or tofu, and low-fat dairy. Your health care provider will help you determine the amount of weight gain that is right for you.  Avoid raw meat and uncooked cheese. These carry germs that can cause birth defects in the baby.  If you have low calcium intake from food, talk to your health care provider about whether you should take a daily calcium supplement.  Eat four or five small meals rather than three large meals a day.  Limit foods that are high in fat and processed sugars, such as fried and sweet foods.  To prevent constipation: ? Drink enough fluid to keep your urine clear or pale yellow. ? Eat foods that are high in fiber, such as fresh fruits and vegetables, whole grains, and beans. Activity  Exercise only as directed by your health care provider. Most women can continue their usual exercise routine during pregnancy. Try to exercise for 30 minutes at least 5 days a week. Stop exercising if you experience uterine contractions.  Avoid heavy   lifting.  Do not exercise in extreme heat or humidity, or at high altitudes.  Wear low-heel, comfortable shoes.  Practice good posture.  You may continue to have sex unless your health care provider tells you otherwise. Relieving pain and discomfort  Take frequent breaks and rest with your legs elevated if you have leg cramps or low back pain.  Take warm sitz baths to soothe any pain or discomfort caused by hemorrhoids. Use hemorrhoid cream if your health care provider approves.  Wear a good support bra to prevent discomfort from breast tenderness.  If you develop varicose veins: ? Wear support pantyhose or compression stockings as told by your healthcare provider. ? Elevate your feet for 15 minutes, 3-4 times a day. Prenatal care  Write down your questions. Take them to your prenatal visits.  Keep all your prenatal visits as told by your health care provider. This is important. Safety  Wear your seat belt at  all times when driving.  Make a list of emergency phone numbers, including numbers for family, friends, the hospital, and police and fire departments. General instructions  Avoid cat litter boxes and soil used by cats. These carry germs that can cause birth defects in the baby. If you have a cat, ask someone to clean the litter box for you.  Do not travel far distances unless it is absolutely necessary and only with the approval of your health care provider.  Do not use hot tubs, steam rooms, or saunas.  Do not drink alcohol.  Do not use any products that contain nicotine or tobacco, such as cigarettes and e-cigarettes. If you need help quitting, ask your health care provider.  Do not use any medicinal herbs or unprescribed drugs. These chemicals affect the formation and growth of the baby.  Do not douche or use tampons or scented sanitary pads.  Do not cross your legs for long periods of time.  To prepare for the arrival of your baby: ? Take prenatal classes to understand, practice, and ask questions about labor and delivery. ? Make a trial run to the hospital. ? Visit the hospital and tour the maternity area. ? Arrange for maternity or paternity leave through employers. ? Arrange for family and friends to take care of pets while you are in the hospital. ? Purchase a rear-facing car seat and make sure you know how to install it in your car. ? Pack your hospital bag. ? Prepare the baby's nursery. Make sure to remove all pillows and stuffed animals from the baby's crib to prevent suffocation.  Visit your dentist if you have not gone during your pregnancy. Use a soft toothbrush to brush your teeth and be gentle when you floss. Contact a health care provider if:  You are unsure if you are in labor or if your water has broken.  You become dizzy.  You have mild pelvic cramps, pelvic pressure, or nagging pain in your abdominal area.  You have lower back pain.  You have persistent  nausea, vomiting, or diarrhea.  You have an unusual or bad smelling vaginal discharge.  You have pain when you urinate. Get help right away if:  Your water breaks before 37 weeks.  You have regular contractions less than 5 minutes apart before 37 weeks.  You have a fever.  You are leaking fluid from your vagina.  You have spotting or bleeding from your vagina.  You have severe abdominal pain or cramping.  You have rapid weight loss or weight gain.    You have shortness of breath with chest pain.  You notice sudden or extreme swelling of your face, hands, ankles, feet, or legs.  Your baby makes fewer than 10 movements in 2 hours.  You have severe headaches that do not go away when you take medicine.  You have vision changes. Summary  The third trimester is from week 28 through week 40, months 7 through 9. The third trimester is a time when the unborn baby (fetus) is growing rapidly.  During the third trimester, your discomfort may increase as you and your baby continue to gain weight. You may have abdominal, leg, and back pain, sleeping problems, and an increased need to urinate.  During the third trimester your breasts will keep growing and they will continue to become tender. A yellow fluid (colostrum) may leak from your breasts. This is the first milk you are producing for your baby.  False labor is a condition in which you feel small, irregular tightenings of the muscles in the womb (contractions) that eventually go away. These are called Braxton Hicks contractions. Contractions may last for hours, days, or even weeks before true labor sets in.  Signs of labor can include: abdominal cramps; regular contractions that start at 10 minutes apart and become stronger and more frequent with time; watery or bloody mucus discharge that comes from the vagina; increased pelvic pressure and dull back pain; and leaking of amniotic fluid. This information is not intended to replace advice  given to you by your health care provider. Make sure you discuss any questions you have with your health care provider. Document Released: 06/24/2001 Document Revised: 12/06/2015 Document Reviewed: 08/31/2012 Elsevier Interactive Patient Education  2017 [employer]  

## 2016-10-12 ENCOUNTER — Encounter (HOSPITAL_COMMUNITY): Payer: Self-pay | Admitting: *Deleted

## 2016-10-12 ENCOUNTER — Inpatient Hospital Stay (HOSPITAL_COMMUNITY)
Admission: AD | Admit: 2016-10-12 | Discharge: 2016-10-14 | DRG: 775 | Disposition: A | Discharge status: Home / Self Care | Payer: Medicaid Other | Source: Ambulatory Visit | Attending: Obstetrics and Gynecology | Admitting: Obstetrics and Gynecology

## 2016-10-12 DIAGNOSIS — Z3A39 39 weeks gestation of pregnancy: Secondary | ICD-10-CM

## 2016-10-12 DIAGNOSIS — O34211 Maternal care for low transverse scar from previous cesarean delivery: Secondary | ICD-10-CM | POA: Diagnosis present

## 2016-10-12 DIAGNOSIS — O09529 Supervision of elderly multigravida, unspecified trimester: Secondary | ICD-10-CM | POA: Diagnosis present

## 2016-10-12 DIAGNOSIS — Z823 Family history of stroke: Secondary | ICD-10-CM | POA: Diagnosis not present

## 2016-10-12 DIAGNOSIS — O4292 Full-term premature rupture of membranes, unspecified as to length of time between rupture and onset of labor: Secondary | ICD-10-CM | POA: Diagnosis present

## 2016-10-12 DIAGNOSIS — Z789 Other specified health status: Secondary | ICD-10-CM | POA: Diagnosis present

## 2016-10-12 DIAGNOSIS — O34219 Maternal care for unspecified type scar from previous cesarean delivery: Secondary | ICD-10-CM | POA: Diagnosis present

## 2016-10-12 DIAGNOSIS — Z603 Acculturation difficulty: Secondary | ICD-10-CM | POA: Diagnosis present

## 2016-10-12 DIAGNOSIS — Z3A38 38 weeks gestation of pregnancy: Secondary | ICD-10-CM

## 2016-10-12 LAB — COMPREHENSIVE METABOLIC PANEL
ALT: 27 U/L (ref 14–54)
AST: 30 U/L (ref 15–41)
Albumin: 3.2 g/dL — ABNORMAL LOW (ref 3.5–5.0)
Alkaline Phosphatase: 186 U/L — ABNORMAL HIGH (ref 38–126)
Anion gap: 8 (ref 5–15)
BILIRUBIN TOTAL: 0.8 mg/dL (ref 0.3–1.2)
BUN: 6 mg/dL (ref 6–20)
CO2: 21 mmol/L — ABNORMAL LOW (ref 22–32)
Calcium: 9.4 mg/dL (ref 8.9–10.3)
Chloride: 106 mmol/L (ref 101–111)
Creatinine, Ser: 0.4 mg/dL — ABNORMAL LOW (ref 0.44–1.00)
GFR calc Af Amer: >60 mL/min (ref >60)
Glucose, Bld: 71 mg/dL (ref 65–99)
POTASSIUM: 3.8 mmol/L (ref 3.5–5.1)
Sodium: 135 mmol/L (ref 135–145)
TOTAL PROTEIN: 6.8 g/dL (ref 6.5–8.1)

## 2016-10-12 LAB — CBC
HCT: 38.7 % (ref 36.0–46.0)
Hemoglobin: 13.1 g/dL (ref 12.0–15.0)
MCH: 24.8 pg — AB (ref 26.0–34.0)
MCHC: 33.9 g/dL (ref 30.0–36.0)
MCV: 73.2 fL — ABNORMAL LOW (ref 78.0–100.0)
PLATELETS: 139 10*3/uL — AB (ref 150–400)
RBC: 5.29 MIL/uL — AB (ref 3.87–5.11)
RDW: 15.2 % (ref 11.5–15.5)
WBC: 11.4 10*3/uL — ABNORMAL HIGH (ref 4.0–10.5)

## 2016-10-12 LAB — PROTEIN / CREATININE RATIO, URINE
Creatinine, Urine: 20 mg/dL
Total Protein, Urine: <6 mg/dL

## 2016-10-12 LAB — TYPE AND SCREEN
ABO/RH(D): O POS
Antibody Screen: NEGATIVE

## 2016-10-12 MED ORDER — ACETAMINOPHEN 325 MG PO TABS: 650.0000 mg | ORAL_TABLET | ORAL | Status: DC | PRN

## 2016-10-12 MED ORDER — IBUPROFEN 600 MG PO TABS
600.0000 mg | ORAL_TABLET | Freq: Four times a day (QID) | ORAL | Status: DC
  Administered 2016-10-12 – 2016-10-14 (×7): 600 mg via ORAL
  Filled 2016-10-12 (×7): qty 1

## 2016-10-12 MED ORDER — TETANUS-DIPHTH-ACELL PERTUSSIS 5-2.5-18.5 LF-MCG/0.5 IM SUSP: 0.5000 mL | Freq: Once | INTRAMUSCULAR | Status: DC

## 2016-10-12 MED ORDER — OXYTOCIN 40 UNITS IN LACTATED RINGERS INFUSION - SIMPLE MED
1.0000 m[IU]/min | INTRAVENOUS | Status: DC
  Administered 2016-10-12: 2 m[IU]/min via INTRAVENOUS
  Filled 2016-10-12: qty 1000

## 2016-10-12 MED ORDER — ONDANSETRON HCL 4 MG/2ML IJ SOLN: 4.0000 mg | Freq: Four times a day (QID) | INTRAMUSCULAR | Status: DC | PRN

## 2016-10-12 MED ORDER — HYDROXYZINE HCL 50 MG PO TABS
50.0000 mg | ORAL_TABLET | Freq: Four times a day (QID) | ORAL | Status: DC | PRN
  Filled 2016-10-12: qty 1

## 2016-10-12 MED ORDER — OXYTOCIN 10 UNIT/ML IJ SOLN
10.0000 [IU] | Freq: Once | INTRAMUSCULAR | Status: DC
Start: 2016-10-12 — End: 2016-10-12

## 2016-10-12 MED ORDER — TERBUTALINE SULFATE 1 MG/ML IJ SOLN
0.2500 mg | Freq: Once | INTRAMUSCULAR | Status: DC | PRN
  Filled 2016-10-12: qty 1

## 2016-10-12 MED ORDER — ONDANSETRON HCL 4 MG/2ML IJ SOLN: 4.0000 mg | INTRAMUSCULAR | Status: DC | PRN

## 2016-10-12 MED ORDER — OXYCODONE-ACETAMINOPHEN 5-325 MG PO TABS: 1.0000 | ORAL_TABLET | ORAL | Status: DC | PRN

## 2016-10-12 MED ORDER — LACTATED RINGERS IV SOLN: 500.0000 mL | INTRAVENOUS | Status: DC | PRN

## 2016-10-12 MED ORDER — SODIUM CHLORIDE 0.9 % IV SOLN: 250.0000 mL | INTRAVENOUS | Status: DC | PRN

## 2016-10-12 MED ORDER — OXYCODONE-ACETAMINOPHEN 5-325 MG PO TABS: 2.0000 | ORAL_TABLET | ORAL | Status: DC | PRN

## 2016-10-12 MED ORDER — OXYTOCIN 40 UNITS IN LACTATED RINGERS INFUSION - SIMPLE MED
2.5000 [IU]/h | INTRAVENOUS | Status: DC
  Administered 2016-10-12: 2.5 [IU]/h via INTRAVENOUS

## 2016-10-12 MED ORDER — SENNOSIDES-DOCUSATE SODIUM 8.6-50 MG PO TABS
2.0000 | ORAL_TABLET | ORAL | Status: DC
Start: 2016-10-13 — End: 2016-10-14
  Administered 2016-10-12 – 2016-10-13 (×2): 2 via ORAL
  Filled 2016-10-12 (×2): qty 2

## 2016-10-12 MED ORDER — SODIUM CHLORIDE 0.9% FLUSH: 3.0000 mL | INTRAVENOUS | Status: DC | PRN

## 2016-10-12 MED ORDER — MISOPROSTOL 200 MCG PO TABS
ORAL_TABLET | ORAL | Status: AC
  Filled 2016-10-12: qty 4

## 2016-10-12 MED ORDER — METHYLERGONOVINE MALEATE 0.2 MG/ML IJ SOLN: 0.2000 mg | Freq: Once | INTRAMUSCULAR | Status: DC

## 2016-10-12 MED ORDER — DIBUCAINE 1 % RE OINT: 1.0000 "application " | TOPICAL_OINTMENT | RECTAL | Status: DC | PRN

## 2016-10-12 MED ORDER — SODIUM CHLORIDE 0.9% FLUSH: 3.0000 mL | Freq: Two times a day (BID) | INTRAVENOUS | Status: DC

## 2016-10-12 MED ORDER — DIPHENHYDRAMINE HCL 25 MG PO CAPS: 25.0000 mg | ORAL_CAPSULE | Freq: Four times a day (QID) | ORAL | Status: DC | PRN

## 2016-10-12 MED ORDER — COCONUT OIL OIL: 1.0000 "application " | TOPICAL_OIL | Status: DC | PRN

## 2016-10-12 MED ORDER — MISOPROSTOL 200 MCG PO TABS
400.0000 ug | ORAL_TABLET | Freq: Once | ORAL | Status: AC
Start: 2016-10-12 — End: 2016-10-12
  Administered 2016-10-12: 400 ug via BUCCAL
  Filled 2016-10-12: qty 2

## 2016-10-12 MED ORDER — WITCH HAZEL-GLYCERIN EX PADS: 1.0000 "application " | MEDICATED_PAD | CUTANEOUS | Status: DC | PRN

## 2016-10-12 MED ORDER — SIMETHICONE 80 MG PO CHEW: 80.0000 mg | CHEWABLE_TABLET | ORAL | Status: DC | PRN

## 2016-10-12 MED ORDER — FENTANYL CITRATE (PF) 100 MCG/2ML IJ SOLN: 50.0000 ug | INTRAMUSCULAR | Status: DC | PRN

## 2016-10-12 MED ORDER — ONDANSETRON HCL 4 MG PO TABS: 4.0000 mg | ORAL_TABLET | ORAL | Status: DC | PRN

## 2016-10-12 MED ORDER — BENZOCAINE-MENTHOL 20-0.5 % EX AERO
1.0000 | INHALATION_SPRAY | CUTANEOUS | Status: DC | PRN
Start: 2016-10-12 — End: 2016-10-14

## 2016-10-12 MED ORDER — LIDOCAINE HCL (PF) 1 % IJ SOLN
30.0000 mL | INTRAMUSCULAR | Status: DC | PRN
  Filled 2016-10-12: qty 30

## 2016-10-12 MED ORDER — LACTATED RINGERS IV SOLN: INTRAVENOUS | Status: DC

## 2016-10-12 MED ORDER — METHYLERGONOVINE MALEATE 0.2 MG/ML IJ SOLN
INTRAMUSCULAR | Status: AC
  Administered 2016-10-12: 0.2 mg
  Filled 2016-10-12: qty 1

## 2016-10-12 MED ORDER — MEASLES, MUMPS & RUBELLA VAC ~~LOC~~ INJ: 0.5000 mL | INJECTION | Freq: Once | SUBCUTANEOUS | Status: DC

## 2016-10-12 MED ORDER — FAMOTIDINE 20 MG PO TABS
20.0000 mg | ORAL_TABLET | Freq: Two times a day (BID) | ORAL | Status: DC
  Administered 2016-10-12 – 2016-10-13 (×3): 20 mg via ORAL
  Filled 2016-10-12 (×3): qty 1

## 2016-10-12 MED ORDER — ZOLPIDEM TARTRATE 5 MG PO TABS: 5.0000 mg | ORAL_TABLET | Freq: Every evening | ORAL | Status: DC | PRN

## 2016-10-12 MED ORDER — PRENATAL MULTIVITAMIN CH
1.0000 | ORAL_TABLET | Freq: Every day | ORAL | Status: DC
  Administered 2016-10-13: 1 via ORAL
  Filled 2016-10-12: qty 1

## 2016-10-12 MED ORDER — OXYTOCIN BOLUS FROM INFUSION
500.0000 mL | Freq: Once | INTRAVENOUS | Status: AC
  Administered 2016-10-12: 500 mL via INTRAVENOUS

## 2016-10-12 NOTE — Anesthesia Pain Management Evaluation Note (Signed)
  CRNA Pain Management Visit Note  Patient: Sabrina Daniels, 36 y.o., female  "Hello I am a member of the anesthesia team at [facility]. We have an anesthesia team available at all times to provide care throughout the hospital, including epidural management and anesthesia for C-section. I don't know your plan for the delivery whether it a natural birth, water birth, IV sedation, nitrous supplementation, doula or epidural, but we want to meet your pain goals."   1.Was your pain managed to your expectations on prior hospitalizations?   Yes   2.What is your expectation for pain management during this hospitalization?     Labor support without medications  3.How can we help you reach that goal?   Record the patient's initial score and the patient's pain goal.   Pain: 4  Pain Goal: 10 [facility] wants you to be able to say your pain was always managed very well.  Laban Emperor 10/12/2016
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
## 2016-10-12 NOTE — Progress Notes (Signed)
Sabrina Daniels is a 36 y.o. [id number] at [redacted]w[redacted]d by ultrasound admitted for rupture of membranes  Subjective:   Objective: BP 120/71   Pulse 79   Temp 97.6 F (36.4 C) (Oral)   Resp 18   Ht 5' (1.524 m)   Wt 139 lb (63 kg)   LMP 02/05/2016   SpO2 100%   BMI 27.15 kg/m  No intake/output data recorded. No intake/output data recorded.  FHT:  FHR: 130's bpm, variability: moderate,  accelerations:  Present,  decelerations:  Absent UC:   regular, every 3-5 minutes SVE:   Dilation: 8.5 Effacement (%): 80 Station: -2 Exam by:: herr  Labs: Lab Results  Component Value Date   WBC 11.4 (H) 10/12/2016   HGB 13.1 10/12/2016   HCT 38.7 10/12/2016   MCV 73.2 (L) 10/12/2016   PLT 139 (L) 10/12/2016    Assessment / Plan: Augmentation of labor, progressing well  Labor: Progressing normally Preeclampsia:  no signs or symptoms of toxicity Fetal Wellbeing:  Category I Pain Control:  Epidural I/D:  n/a Anticipated MOD:  NSVD  Wyvonnia Dusky 10/12/2016, 1:40 PM

## 2016-10-12 NOTE — MAU Note (Signed)
Noted bleeding when went to bathroom. Pt denies pain.  Had clear fluid running down leg when arrived in triage. Small puddle on floor, slide obtained. (+fern)

## 2016-10-12 NOTE — Progress Notes (Signed)
Language device used for interpretation of admission information, safety, answer questions, and meal orders. Interpreter Diaana, [id number] used. Dinner and breakfast meals ordered for patient. Patient states she wants to breast and formula feed. Discussed benefits of exclusive feeding, reassured that medical staff will assist and supervise infants intake and output and assist as needed. Mother reports she breast fed for 7-8 months with other children and had to formula supplement because of concerns of milk supply.

## 2016-10-13 LAB — RPR: RPR Ser Ql: NONREACTIVE

## 2016-10-13 NOTE — Lactation Note (Signed)
This note was copied from a baby's chart. Lactation Consultation Note  Assisted experience BF mother with feeding. Reviewed feeding cues, hand expression and positioning. Initially latch was painful but with adjustments and deeper latch the pain ceased.  Gave her phone # to call if pain persists or she has DL. Information given on support groups and OP services. Follow-up planned.  Patient Name: Sabrina Daniels Today's Date: 10/13/2016 Reason for consult: Initial assessment   Maternal Data Has patient been taught Hand Expression?: Yes Does the patient have breastfeeding experience prior to this delivery?: Yes  Feeding Feeding Type: Breast Fed Length of feed:  (observed 20 minutes)  LATCH Score/Interventions Latch: Repeated attempts needed to sustain latch, nipple held in mouth throughout feeding, stimulation needed to elicit sucking reflex.  Audible Swallowing: A few with stimulation  Type of Nipple: Everted at rest and after stimulation  Comfort (Breast/Nipple): Filling, red/small blisters or bruises, mild/mod discomfort  Problem noted: Mild/Moderate discomfort Interventions (Mild/moderate discomfort): Hand massage  Hold (Positioning): Assistance needed to correctly position infant at breast and maintain latch.  LATCH Score: 6  Lactation Tools Discussed/Used     Consult Status Consult Status: Follow-up Date: 10/14/16 Follow-up type: In-patient    Soyla Dryer 10/13/2016, 10:26 AM

## 2016-10-14 ENCOUNTER — Encounter: Payer: Self-pay | Admitting: Family Medicine

## 2016-10-14 MED ORDER — IBUPROFEN 600 MG PO TABS: 600.0000 mg | ORAL_TABLET | Freq: Four times a day (QID) | ORAL | 0 refills | Status: DC

## 2016-10-14 NOTE — Discharge Instructions (Signed)

## 2016-10-14 NOTE — Discharge Summary (Signed)
OB Discharge Summary     Patient Name: Sabrina Daniels DOB: 1980/12/23 MRN: [id number]  Date of admission: 10/12/2016 Delivering MD: Greer Ee   Date of discharge: 10/14/2016  Admitting diagnosis: 39 wks went to the bathroom spotting Intrauterine pregnancy: [redacted]w[redacted]d     Secondary diagnosis:  Active Problems:   VBAC (vaginal birth after Cesarean)   Language barrier   AMA (advanced maternal age) multigravida 35+, second trimester   Labor without complication  Additional problems: none     Discharge diagnosis: VBAC                                                                                                Post partum procedures:none  Augmentation: AROM and Pitocin  Complications: None  Hospital course:  Onset of Labor With Vaginal Delivery     36 y.o. yo [id number] at [redacted]w[redacted]d was admitted in Active Labor on 10/12/2016. Patient had an uncomplicated labor course as follows:  Membrane Rupture Time/Date: 10:50 AM ,10/12/2016   Intrapartum Procedures: Episiotomy: None [1]                                         Lacerations:  None [1]  Patient had a delivery of a Viable infant. 10/12/2016  Information for the patient's newborn:  Makinze, Jani Boy Earlena [[id number]]  Delivery Method: VBAC, Spontaneous (Filed from Delivery Summary)    Pateint had an uncomplicated postpartum course.  She is ambulating, tolerating a regular diet, passing flatus, and urinating well. Patient is discharged home in stable condition on 10/14/16.   Physical exam  Vitals:   10/12/16 1730 10/12/16 2100 10/13/16 0520 10/13/16 1712  BP: 116/75 (!) 101/55 96/61 108/62  Pulse: 61 76 70 68  Resp: Temp: 98.4 F (36.9 C) 98.2 F (36.8 C) 97.6 F (36.4 C) 98.1 F (36.7 C)  TempSrc: Oral   Oral  SpO2:      Weight:      Height:       General: alert, cooperative and no distress Lochia: appropriate Uterine Fundus: firm Incision: N/A DVT Evaluation: No evidence of DVT seen on physical exam. Labs: Lab Results   Component Value Date   WBC 11.4 (H) 10/12/2016   HGB 13.1 10/12/2016   HCT 38.7 10/12/2016   MCV 73.2 (L) 10/12/2016   PLT 139 (L) 10/12/2016   CMP Latest Ref Rng & Units 10/12/2016  Glucose 65 - 99 mg/dL 71  BUN 6 - 20 mg/dL 6  Creatinine 1.30 - 8.65 mg/dL 7.84(O)  Sodium 962 - 952 mmol/L 135  Potassium 3.5 - 5.1 mmol/L 3.8  Chloride 101 - 111 mmol/L 106  CO2 22 - 32 mmol/L 21(L)  Calcium 8.9 - 10.3 mg/dL 9.4  Total Protein 6.5 - 8.1 g/dL 6.8  Total Bilirubin 0.3 - 1.2 mg/dL 0.8  Alkaline Phos 38 - 126 U/L 186(H)  AST 15 - 41 U/L 30  ALT 14 - 54 U/L 27    Discharge instruction: per After  Visit Summary and "Baby and Me Booklet".  After visit meds:  Allergies as of 10/14/2016   No Known Allergies     Medication List    STOP taking these medications   aspirin EC 81 MG tablet     TAKE these medications   famotidine 20 MG tablet Commonly known as:  PEPCID Take 1 tablet (20 mg total) by mouth 2 (two) times daily.   ibuprofen 600 MG tablet Commonly known as:  ADVIL,MOTRIN Take 1 tablet (600 mg total) by mouth every 6 (six) hours.   Prenatal Vitamins 0.8 MG tablet Take 1 tablet by mouth daily.       Diet: routine diet  Activity: Advance as tolerated. Pelvic rest for 6 weeks.   Outpatient follow up:6 weeks Follow up Appt:No future appointments. Follow up Visit:No Follow-up on file.  Postpartum contraception: Combination OCPs  Newborn Data: Live born female  Birth Weight: 7 lb 5.8 oz (3340 g) APGAR: 9, 9  Baby Feeding: Bottle and Breast Disposition:home with mother  10/14/2016 Almon Hercules, MD

## 2016-10-14 NOTE — Lactation Note (Signed)
This note was copied from a baby's chart. Lactation Consultation Note  Patient Name: Sabrina Daniels Today's Date: 10/14/2016 Reason for consult: Follow-up assessment  Visited with Mom, baby 66 hrs old.  Mom had baby on breast in cradle hold using scissor hold of breast.  Offered to assist with positioning.  Undressed baby to allow for STS.  Added pillow to support baby, and adjusted Mom's hands to facilitate cross cradle.  Baby latched again easily.  Breasts are filling, but soft.  Milk easily expressed.  Hand pump given with instructions on use and cleaning.  Encouraged Mom to keep baby STS, and feed often on cue, goal of 8-12 feedings per 24 hrs.  Talked about change in stool to be expected in next couple days.   Mom aware of OP Lactation services.  Encouraged Mom to call prn for assistance.  Consult Status Consult Status: Complete Date: 10/14/16 Follow-up type: Call as needed    Judee Clara 10/14/2016, 8:55 AM

## 2016-10-16 ENCOUNTER — Encounter: Payer: Medicaid Other | Admitting: Obstetrics and Gynecology

## 2016-10-21 ENCOUNTER — Encounter: Payer: Self-pay | Admitting: Certified Nurse Midwife

## 2016-11-11 ENCOUNTER — Ambulatory Visit (INDEPENDENT_AMBULATORY_CARE_PROVIDER_SITE_OTHER): Payer: Medicaid Other | Admitting: Certified Nurse Midwife

## 2016-11-11 ENCOUNTER — Encounter: Payer: Self-pay | Admitting: Certified Nurse Midwife

## 2016-11-11 DIAGNOSIS — Z30011 Encounter for initial prescription of contraceptive pills: Secondary | ICD-10-CM

## 2016-11-11 DIAGNOSIS — O9229 Other disorders of breast associated with pregnancy and the puerperium: Secondary | ICD-10-CM

## 2016-11-11 MED ORDER — NORETHINDRONE 0.35 MG PO TABS: 1.0000 | ORAL_TABLET | Freq: Every day | ORAL | 6 refills | Status: DC

## 2016-11-11 NOTE — Progress Notes (Signed)
Subjective:     Sabrina Daniels is a 36 y.o. female who presents for a postpartum visit. She is 4 weeks postpartum following a spontaneous vaginal delivery. I have fully reviewed the prenatal and intrapartum course. The delivery was at 39 gestational weeks. Outcome: vaginal birth after cesarean (VBAC). Anesthesia: none. Postpartum course has been uncomplicated. Baby's course has been uncomplicated. Baby is feeding by both breast and bottle - Similac Advance. Bleeding no bleeding. Bowel function is normal. Bladder function is normal. Patient is not sexually active. Contraception method is oral progesterone-only contraceptive. Postpartum depression screening: negative. Reports nipple pain x4 weeks, worsened with latching and contact by water.  The following portions of the patient's history were reviewed and updated as appropriate: allergies, current medications, past family history, past medical history, past social history, past surgical history and problem list.  Review of Systems Pertinent items are noted in HPI.   Objective:    BP 122/82   Pulse 60   Wt 122 lb 6.4 oz (55.5 kg)   Breastfeeding? Yes Comment: Breast and bottle   BMI 23.90 kg/m   General:  alert, cooperative and no distress   Breasts:  negative findings: normal in size and symmetry, normal contour with no evidence of flattening or dimpling, skin normal and positive findings: nipple discharge bilaterally and milky, small superficial cracking noted bilaterally  Lungs:   Heart:    Abdomen:    Vulva:    Vagina:   Cervix:    Corpus:   Adnexa:    Rectal Exam:         Assessment:      Normal postpartum exam. Pap smear not done at today's visit.   Postpartum nipple pain Oral (POP) contraceptive counseling  Plan:    1. Contraception: oral progesterone-only contraceptive-call clinic when d/c BF for switch to COC or patch  2. Recommend using expressed milk or coconut oil as moisturizer  3. Follow up in: 1 year or as needed.

## 2016-11-18 ENCOUNTER — Other Ambulatory Visit: Payer: Self-pay | Admitting: Student

## 2016-11-25 ENCOUNTER — Encounter (HOSPITAL_COMMUNITY): Payer: Self-pay

## 2016-11-25 ENCOUNTER — Inpatient Hospital Stay (HOSPITAL_COMMUNITY)
Admission: AD | Admit: 2016-11-25 | Discharge: 2016-11-25 | Disposition: A | Discharge status: Home / Self Care | Payer: Medicaid Other | Source: Ambulatory Visit | Attending: Obstetrics and Gynecology | Admitting: Obstetrics and Gynecology

## 2016-11-25 DIAGNOSIS — H9211 Otorrhea, right ear: Secondary | ICD-10-CM | POA: Insufficient documentation

## 2016-11-25 DIAGNOSIS — K219 Gastro-esophageal reflux disease without esophagitis: Secondary | ICD-10-CM | POA: Diagnosis not present

## 2016-11-25 DIAGNOSIS — K2100 Gastro-esophageal reflux disease with esophagitis, without bleeding: Secondary | ICD-10-CM

## 2016-11-25 DIAGNOSIS — R200 Anesthesia of skin: Secondary | ICD-10-CM | POA: Diagnosis not present

## 2016-11-25 DIAGNOSIS — M79604 Pain in right leg: Secondary | ICD-10-CM | POA: Insufficient documentation

## 2016-11-25 DIAGNOSIS — M79605 Pain in left leg: Secondary | ICD-10-CM | POA: Diagnosis not present

## 2016-11-25 DIAGNOSIS — R1110 Vomiting, unspecified: Secondary | ICD-10-CM | POA: Diagnosis present

## 2016-11-25 DIAGNOSIS — O99613 Diseases of the digestive system complicating pregnancy, third trimester: Secondary | ICD-10-CM

## 2016-11-25 LAB — URINALYSIS, ROUTINE W REFLEX MICROSCOPIC
Bilirubin Urine: NEGATIVE
GLUCOSE, UA: NEGATIVE mg/dL
HGB URINE DIPSTICK: NEGATIVE
KETONES UR: NEGATIVE mg/dL
Leukocytes, UA: NEGATIVE
Nitrite: NEGATIVE
PH: 6 (ref 5.0–8.0)
Protein, ur: NEGATIVE mg/dL
Specific Gravity, Urine: 1.002 — ABNORMAL LOW (ref 1.005–1.030)

## 2016-11-25 LAB — POCT PREGNANCY, URINE: Preg Test, Ur: NEGATIVE

## 2016-11-25 MED ORDER — FAMOTIDINE 20 MG PO TABS
40.0000 mg | ORAL_TABLET | Freq: Once | ORAL | Status: AC
  Administered 2016-11-25: 40 mg via ORAL
  Filled 2016-11-25: qty 2

## 2016-11-25 MED ORDER — FAMOTIDINE 20 MG PO TABS: 20.0000 mg | ORAL_TABLET | Freq: Two times a day (BID) | ORAL | 3 refills | Status: DC

## 2016-11-25 NOTE — MAU Note (Signed)
Pt presents to MAU with complaints of having vomiting and not able to sleep since she has numbness in her upper chest area. Delivered vaginally on April the 1st.
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
## 2016-11-25 NOTE — Discharge Instructions (Signed)
Food Choices for Gastroesophageal Reflux Disease, Adult When you have gastroesophageal reflux disease (GERD), the foods you eat and your eating habits are very important. Choosing the right foods can help ease your discomfort. What guidelines do I need to follow?  Choose fruits, vegetables, whole grains, and low-fat dairy products.  Choose low-fat meat, fish, and poultry.  Limit fats such as oils, salad dressings, butter, nuts, and avocado.  Keep a food diary. This helps you identify foods that cause symptoms.  Avoid foods that cause symptoms. These may be different for everyone.  Eat small meals often instead of 3 large meals a day.  Eat your meals slowly, in a place where you are relaxed.  Limit fried foods.  Cook foods using methods other than frying.  Avoid drinking alcohol.  Avoid drinking large amounts of liquids with your meals.  Avoid bending over or lying down until 2-3 hours after eating. What foods are not recommended? These are some foods and drinks that may make your symptoms worse: Vegetables  Tomatoes. Tomato juice. Tomato and spaghetti sauce. Chili peppers. Onion and garlic. Horseradish. Fruits  Oranges, grapefruit, and lemon (fruit and juice). Meats  High-fat meats, fish, and poultry. This includes hot dogs, ribs, ham, sausage, salami, and bacon. Dairy  Whole milk and chocolate milk. Sour cream. Cream. Butter. Ice cream. Cream cheese. Drinks  Coffee and tea. Bubbly (carbonated) drinks or energy drinks. Condiments  Hot sauce. Barbecue sauce. Sweets/Desserts  Chocolate and cocoa. Donuts. Peppermint and spearmint. Fats and Oils  High-fat foods. This includes French fries and potato chips. Other  Vinegar. Strong spices. This includes black pepper, white pepper, red pepper, cayenne, curry powder, cloves, ginger, and chili powder. The items listed above may not be a complete list of foods and drinks to avoid. Contact your dietitian for more information.    This information is not intended to replace advice given to you by your health care provider. Make sure you discuss any questions you have with your health care provider. Document Released: 12/30/2011 Document Revised: 12/06/2015 Document Reviewed: 05/04/2013 Elsevier Interactive Patient Education  2017 Elsevier Inc.  

## 2017-08-17 ENCOUNTER — Other Ambulatory Visit: Payer: Self-pay | Admitting: Advanced Practice Midwife

## 2017-08-17 DIAGNOSIS — K219 Gastro-esophageal reflux disease without esophagitis: Secondary | ICD-10-CM

## 2017-08-17 DIAGNOSIS — O99613 Diseases of the digestive system complicating pregnancy, third trimester: Principal | ICD-10-CM

## 2017-11-30 ENCOUNTER — Ambulatory Visit (INDEPENDENT_AMBULATORY_CARE_PROVIDER_SITE_OTHER): Payer: Self-pay | Admitting: Medical

## 2017-11-30 ENCOUNTER — Encounter: Payer: Self-pay | Admitting: Medical

## 2017-11-30 VITALS — BP 125/85 | HR 65 | Wt 124.0 lb

## 2017-11-30 DIAGNOSIS — R300 Dysuria: Secondary | ICD-10-CM

## 2017-11-30 DIAGNOSIS — N941 Unspecified dyspareunia: Secondary | ICD-10-CM

## 2017-11-30 LAB — POCT URINALYSIS DIP (DEVICE)
BILIRUBIN URINE: NEGATIVE
Glucose, UA: NEGATIVE mg/dL
HGB URINE DIPSTICK: NEGATIVE
KETONES UR: NEGATIVE mg/dL
Leukocytes, UA: NEGATIVE
NITRITE: NEGATIVE
PROTEIN: NEGATIVE mg/dL
SPECIFIC GRAVITY, URINE: 1.02 (ref 1.005–1.030)
UROBILINOGEN UA: 0.2 mg/dL (ref 0.0–1.0)
pH: 7 (ref 5.0–8.0)

## 2017-11-30 MED ORDER — PHENAZOPYRIDINE HCL 200 MG PO TABS: 200.0000 mg | ORAL_TABLET | Freq: Three times a day (TID) | ORAL | 0 refills | Status: DC | PRN

## 2017-11-30 NOTE — Progress Notes (Addendum)
Having urinary pain for 2 wks. Feels like having bladder spasms. Burns with urination. 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

## 2017-11-30 NOTE — Progress Notes (Signed)
  History:  [name] is a 37 y.o. [id number] who presents to clinic today for dysuria and dyspareunia x 2 weeks. The patient is still breastfeeding. She is not on birth control. She states last intercourse was yesterday. She feels that dyspareunia is related to vaginal dryness. She states regular periods since birth of last child 1 year ago. LMP ~ 2 weeks ago. She denies vaginal discharge or bleeding today. She feels that pain is    The following portions of the patient's history were reviewed and updated as appropriate: allergies, current medications, family history, past medical history, social history, past surgical history and problem list.  Review of Systems:  Review of Systems  Constitutional: Negative for fever.  Gastrointestinal: Negative for abdominal pain.  Genitourinary: Positive for dysuria.       Neg - vaginal bleeding, discharge      Objective:  Physical Exam BP 125/85   Pulse 65   Wt 124 lb (56.2 kg)   LMP 11/23/2017   Breastfeeding? Yes   BMI 24.22 kg/m  Physical Exam  Constitutional: She appears well-developed and well-nourished. No distress.  HENT:  Head: Normocephalic.  Cardiovascular: Normal rate.  Pulmonary/Chest: Effort normal.  Abdominal: Soft.  Skin: Skin is warm and dry. No erythema.  Nursing note and vitals reviewed.    Labs and Imaging Results for orders placed or performed in visit on 11/30/17 (from the past 24 hour(s))  POCT urinalysis dip (device)     Status: None   Collection Time: 11/30/17  5:22 PM  Result Value Ref Range   Glucose, UA NEGATIVE NEGATIVE mg/dL   Bilirubin Urine NEGATIVE NEGATIVE   Ketones, ur NEGATIVE NEGATIVE mg/dL   Specific Gravity, Urine 1.020 1.005 - 1.030   Hgb urine dipstick NEGATIVE NEGATIVE   pH 7.0 5.0 - 8.0   Protein, ur NEGATIVE NEGATIVE mg/dL   Urobilinogen, UA 0.2 0.0 - 1.0 mg/dL   Nitrite NEGATIVE NEGATIVE   Leukocytes, UA NEGATIVE NEGATIVE    Assessment & Plan:  1. Dysuria - No evidence of acute  infection today  - phenazopyridine (PYRIDIUM) 200 MG tablet; Take 1 tablet (200 mg total) by mouth 3 (three) times daily as needed for pain.  Dispense: 20 tablet; Refill: 0 - Patient will call back if still having symptoms in 2 weeks for possible referral to Urology - Also given contact information for [facility] for further evaluation and management   2. Dyspareunia in female - Advised water based lubricant for intercourse as dryness is likely related to continued breastfeeding  - Can re-evaluate when no longer breastfeeding if continues to be an issue   Patient may follow-up with [facility] PR[facility]   Kathlene Cote 11/30/2017 6:11 PM

## 2017-11-30 NOTE — Addendum Note (Signed)
Addended by: Vrishank Moster R on: 11/30/2017 07:24 PM   Modules accepted: Orders  

## 2017-11-30 NOTE — Patient Instructions (Addendum)
Dyspareunia, Female Dyspareunia is pain that is associated with sexual activity. This can affect any part of the genitals or lower abdomen, and there are many possible causes. This condition ranges from mild to severe. Depending on the cause, dyspareunia may get better with treatment, or it may return (recur) over time. What are the causes? The cause of this condition is not always known. Possible causes include:  Cancer.  Psychological factors, such as depression, anxiety, or previous traumatic experiences.  Severe pain and tenderness of the skin around the vagina (vulva) when it is touched (vulvar vestibulitis syndrome).  Infection of the pelvis or the vulva.  Infection of the vagina.  Painful, involuntary tightening (contraction) of the vaginal muscles when anything is put inside the vagina (vaginismus).  Allergic reaction.  Ovarian cysts.  Solid growths of tissue (tumors) in the ovaries or the uterus.  Scar tissue in the ovaries, vagina, or pelvis.  Vaginal dryness.  Thinning of the tissue (atrophy) of the vulva and vagina.  Skin conditions that affect the vulva (vulvar dermatoses), such as lichen sclerosus or lichen planus.  Endometriosis.  Tubal pregnancy.  A tilted uterus.  Uterine prolapse.  Adhesions in the vagina.  Bladder problems.  Intestinal problems.  Certain medicines.  Medical conditions such as diabetes, arthritis, or thyroid disease.  What increases the risk? The following factors may make you more likely to develop this condition:  Having experienced physical or sexual trauma.  Having given birth more than once.  Taking birth control pills.  Having gone through menopause.  Having recently given birth, typically within the past 3-6 months.  Breastfeeding.  What are the signs or symptoms? The main symptom of this condition is pain in any part of the genitals or lower abdomen during or after sexual activity. This may include pain  during sexual arousal, genital stimulation, or orgasm. Pain may get worse when anything is inserted into the vagina, or when the genitals are touched in any way, such as when sitting or wearing pants. Pain can range from mild to severe, depending on the cause of the condition. In some cases, symptoms go away with treatment and return (recur) at a later date. How is this diagnosed? This condition may be diagnosed based on:  Your symptoms, including: ? Where your pain is located. ? When your pain occurs.  Your medical history.  A physical exam. This may include a pelvic exam and a Pap test. This is a screening test that is used to check for signs of cancer of the vagina, cervix, and uterus.  Tests, including: ? Blood tests. ? Ultrasound. This uses sound waves to make a picture of the area that is being tested. ? Urine culture. This test involves checking a urine sample for signs of infection. ? Culture test. This is when your health care provider uses a swab to collect a sample of vaginal fluid. The sample is checked for signs of infection. ? X-rays. ? MRI. ? CT scan. ? Laparoscopy. This is a procedure in which a small incision is made in your lower abdomen and a lighted, pencil-sized instrument (laparoscope) is passed through the incision and used to look inside your pelvis.  You may be referred to a health care provider who specializes in women's health (gynecologist). In some cases, diagnosing the cause of dyspareunia can be difficult. How is this treated? Treatment depends on the cause of your condition and your symptoms. In most cases, you may need to stop sexual activity until your symptoms   improve. Treatment may include:  Lubricants.  Kegel exercises or vaginal dilators.  Medicated skin creams.  Medicated vaginal creams.  Hormonal therapy.  Antibiotic medicine to prevent or fight infection.  Medicines that help to relieve pain.  Medicines that treat depression  (antidepressants).  Psychological counseling.  Sex therapy.  Surgery.  Follow these instructions at home: Lifestyle  Avoid tight clothing and irritating materials around your genital and abdominal area.  Use water-based lubricants as needed. Avoid oil-based lubricants.  Do not use any products that irritate you. This may include certain condoms, spermicides, lubricants, soaps, tampons, vaginal sprays, or douches.  Always practice safe sex. Talk with your health care provider about which form of birth control (contraception) is best for you.  Maintain open communication with your sexual partner. General instructions  Take over-the-counter and prescription medicines only as told by your health care provider.  If you had tests done, it is your responsibility to get your tests results. Ask your health care provider or the department performing the test when your results will be ready.  Urinate before you engage in sexual activity.  Consider joining a support group.  Keep all follow-up visits as told by your health care provider. This is important. Contact a health care provider if:  You develop vaginal bleeding after sexual intercourse.  You develop a lump at the opening of your vagina. Seek medical care even if the lump is painless.  You have: ? Abnormal vaginal discharge. ? Vaginal dryness. ? Itchiness or irritation of your vulva or vagina. ? A new rash. ? Symptoms that get worse or do not improve with treatment. ? A fever. ? Pain when you urinate. ? Blood in your urine. Get help right away if:  You develop severe pain in your abdomen during or shortly after sexual intercourse.  You pass out after having sexual intercourse. This information is not intended to replace advice given to you by your health care provider. Make sure you discuss any questions you have with your health care provider. Document Released: 07/20/2007 Document Revised: 11/09/2015 Document  Reviewed: 01/30/2015 Elsevier Interactive Patient Education  2018 [employer].  [facility] -  [phone]

## 2017-12-01 LAB — POCT PREGNANCY, URINE: Preg Test, Ur: NEGATIVE

## 2018-01-01 IMAGING — US US MFM OB DETAIL+14 WK
1 series · 14 of 28 positions shown · non-contrast
Comparison: none

[Series 1: us mfm ob detail+14 wk · 82 acquisitions, 14 frames shown]
[im 4/82]
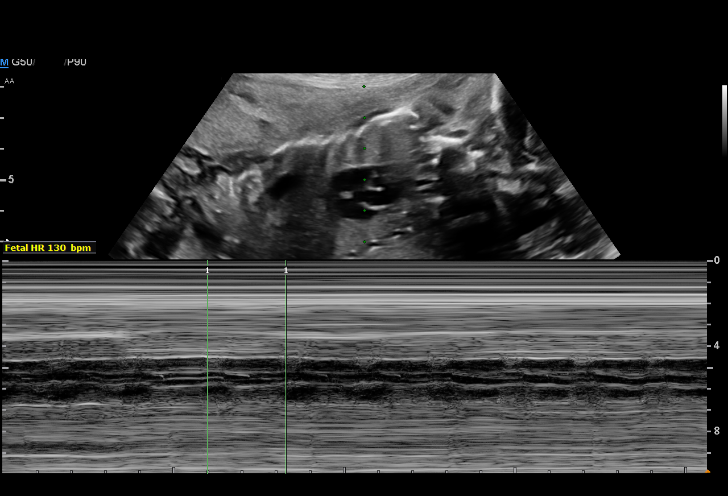
[im 10/82]
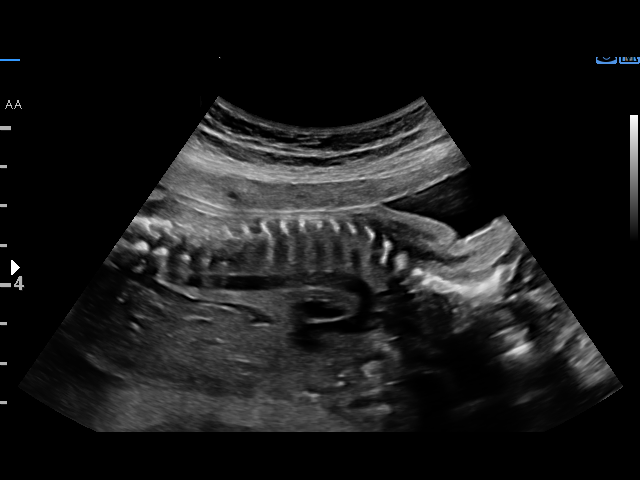
[im 16/82]
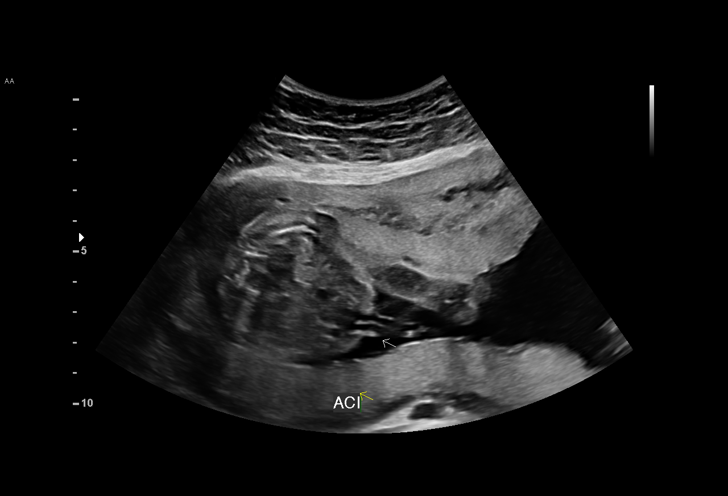
[im 22/82]
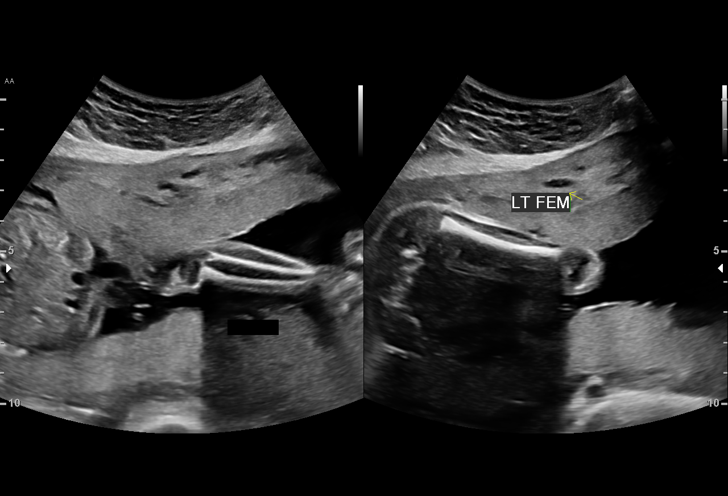
[im 28/82]
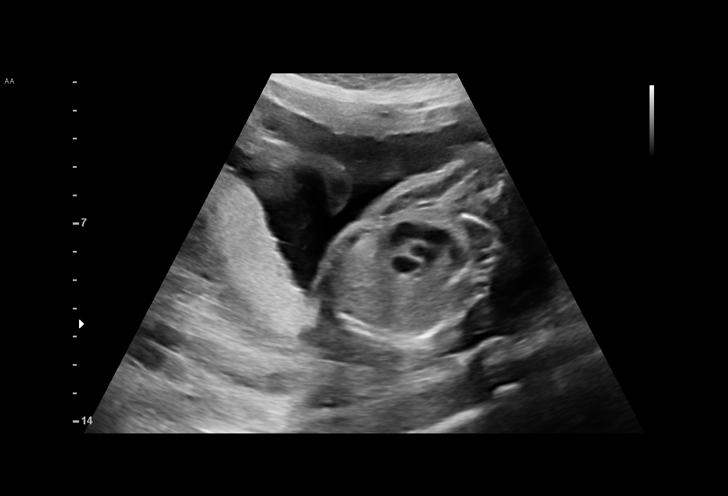
[im 34/82]
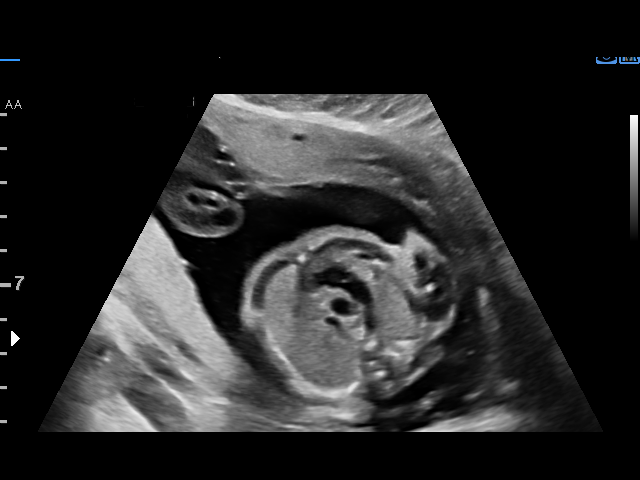
[im 40/82]
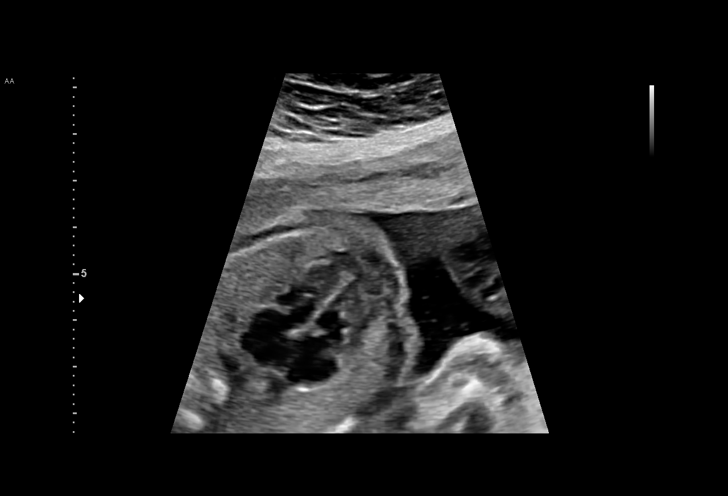
[im 46/82]
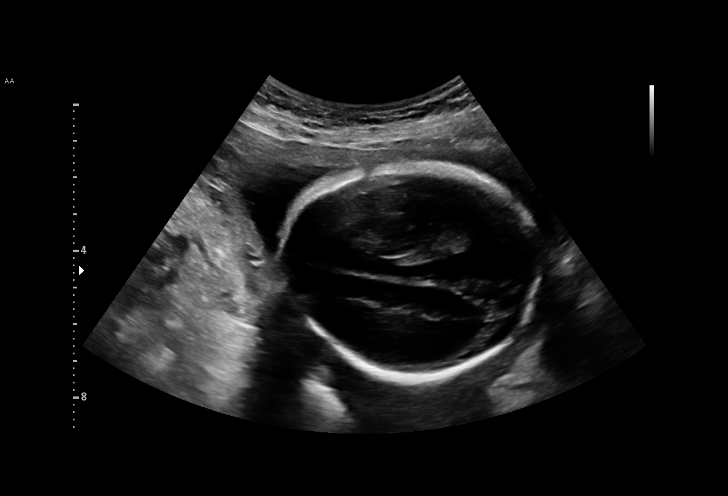
[im 52/82]
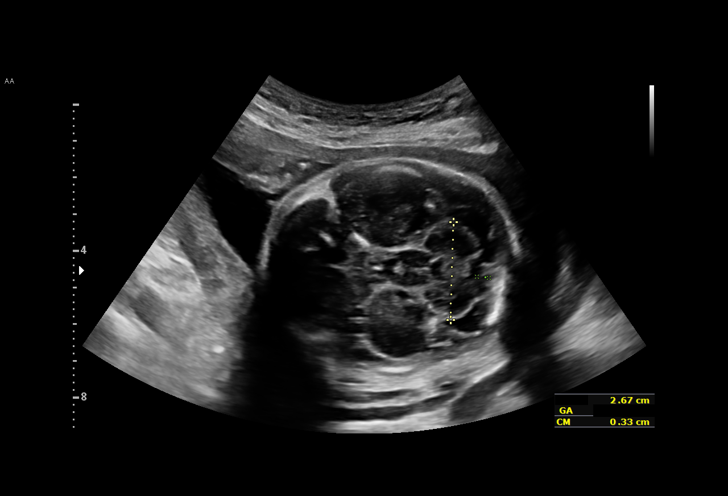
[im 58/82]
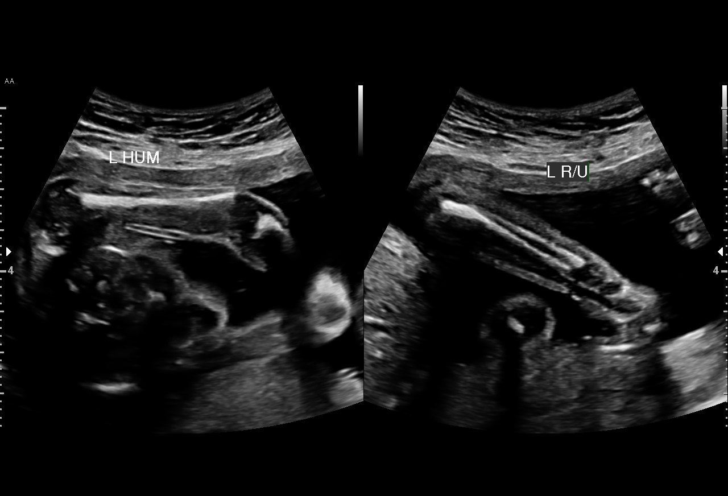
[im 64/82]
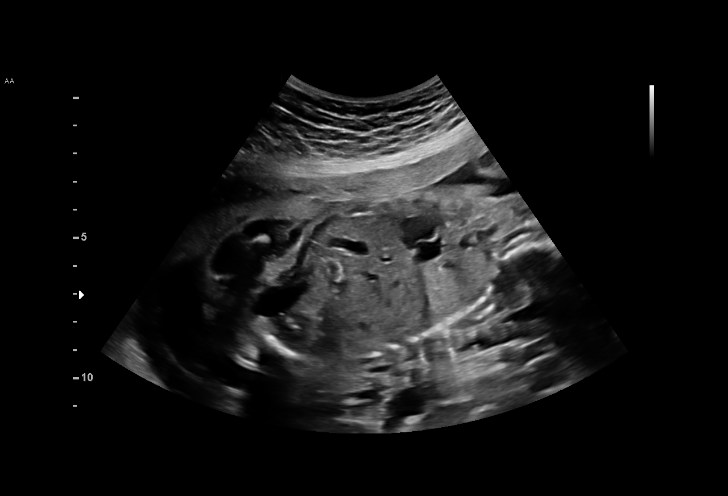
[im 70/82]
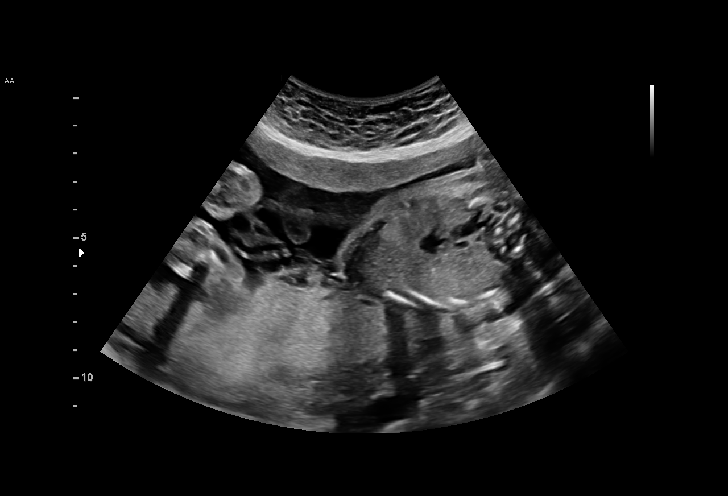
[im 76/82]
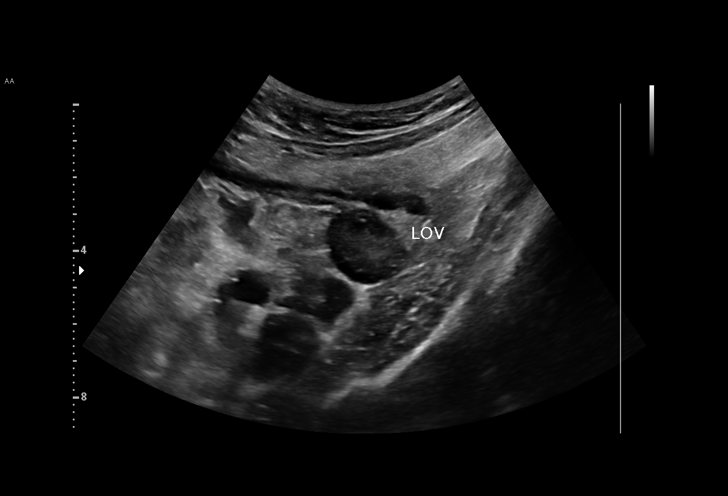
[im 82/82]
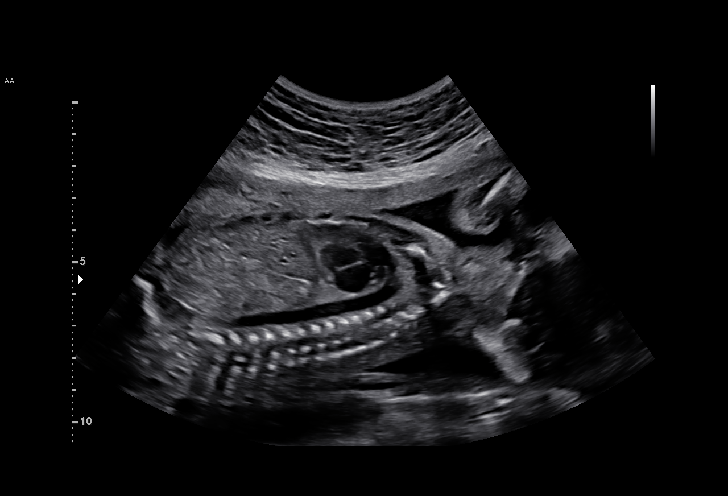

[14 of 28 positions shown; findings below may reference images not displayed]

JUMPER NP

1  KEYKERS GANTENG            [id number]      [id number]     [id number]
Indications

23 weeks gestation of pregnancy
Encounter for antenatal screening for
malformations
Advanced maternal age multigravida 35+,
second trimester
Poor obstetric history: Previous preterm
delivery, antepartum
Poor obstetric history: Previous neonatal
death
Poor obstetric history: Previous gestational
HTN
Medical complication of pregnancy (specify);
hemoglobinopathy
OB History

Gravidity:    6         Term:   3        Prem:   1
Living:       4
Fetal Evaluation

Num Of Fetuses:     1
Fetal Heart         130
Rate(bpm):
Cardiac Activity:   Observed
Presentation:       Cephalic
Placenta:           Posterior, above cervical os
P. Cord Insertion:  Visualized

Amniotic Fluid
AFI FV:      Subjectively within normal limits
Largest Pocket(cm)
4.
Biometry

BPD:      60.3  mm     G. Age:  24w 4d         79  %    CI:        77.39   %   70 - 86
FL/HC:      19.0   %   18.7 -
HC:       217   mm     G. Age:  23w 5d         41  %    HC/AC:      1.22       1.05 -
AC:      178.1  mm     G. Age:  22w 5d         18  %    FL/BPD:     68.3   %   71 - 87
FL:       41.2  mm     G. Age:  23w 3d         32  %    FL/AC:      23.1   %   20 - 24
CER:      26.7  mm     G. Age:  24w 2d         64  %
CM:        3.3  mm
TIB:      34.9  mm     G. Age:  23w 0d         36  %
FIB:      35.7  mm     G. Age:  23w 2d         59  %

Est. FW:     566  gm      1 lb 4 oz     43  %
Gestational Age

LMP:           19w 6d       Date:   02/05/16                 EDD:   11/11/16
U/S Today:     23w 4d                                        EDD:   10/16/16
Best:          23w 4d    Det. By:   U/S (06/23/16)           EDD:   10/16/16
Anatomy

Cranium:               Appears normal         Aortic Arch:            Appears normal
Cavum:                 Appears normal         Ductal Arch:            Appears normal
Ventricles:            Appears normal         Diaphragm:              Appears normal
Choroid Plexus:        Appears normal         Stomach:                Appears normal, left
sided
Cerebellum:            Appears normal         Abdomen:                Appears normal
Posterior Fossa:       Appears normal         Abdominal Wall:         Appears nml (cord
insert, abd wall)
Nuchal Fold:           Appears normal         Cord Vessels:           Appears normal (3
vessel cord)
Face:                  Appears normal         Kidneys:                Appear normal
(orbits and profile)
Lips:                  Appears normal         Bladder:                Appears normal
Thoracic:              Appears normal         Spine:                  Appears normal
Heart:                 Appears normal         Upper Extremities:      Appears normal
(4CH, axis, and
situs)
RVOT:                  Appears normal         Lower Extremities:      Appears normal
LVOT:                  Appears normal

Other:  Fetus appears to be a male. Heels and 5th digit visualized.
Cervix Uterus Adnexa

Cervix
Length:           3.53  cm.
Normal appearance by transabdominal scan.

Left Ovary
Within normal limits.

Right Ovary
Within normal limits.
Impression

SIUP at 23+4 weeks
Normal detailed fetal anatomy
Markers of aneuploidy: none
Normal amniotic fluid volume
EDC based on today's measurements: 10/16/16

After counseling (by myself) through an interpreter, Ms. Kpa
decided to have cell free DNA screening.
Recommendations

Consider follow-up ultrasound for growth in third trimester
(AMA)

## 2018-05-03 ENCOUNTER — Ambulatory Visit: Payer: Medicaid Other

## 2018-05-05 ENCOUNTER — Ambulatory Visit (INDEPENDENT_AMBULATORY_CARE_PROVIDER_SITE_OTHER): Payer: Self-pay

## 2018-05-05 ENCOUNTER — Encounter: Payer: Self-pay | Admitting: Family Medicine

## 2018-05-05 DIAGNOSIS — Z3201 Encounter for pregnancy test, result positive: Secondary | ICD-10-CM

## 2018-05-05 LAB — POCT PREGNANCY, URINE: PREG TEST UR: POSITIVE — AB

## 2018-05-05 NOTE — Progress Notes (Signed)
I have reviewed the chart and agree with nursing staff's documentation of this patient's encounter.  Sabrina Cogburn, MD 05/05/2018 8:57 AM    

## 2018-05-05 NOTE — Progress Notes (Signed)
Pt here for UPT-Positive,LMP: 01/25/18, EDD; 10/31/17, GA: [redacted]w[redacted]d.Reviewed meds, advsd to start Prenatal Vitamins. Pt states wants to start care here, advised at checkout see if she can make appt. If not they can advise elsewhere.

## 2018-05-18 ENCOUNTER — Encounter: Payer: Medicaid Other | Admitting: Obstetrics & Gynecology

## 2018-05-20 ENCOUNTER — Encounter: Payer: Self-pay | Admitting: *Deleted

## 2018-05-20 ENCOUNTER — Ambulatory Visit (INDEPENDENT_AMBULATORY_CARE_PROVIDER_SITE_OTHER): Payer: Medicaid Other | Admitting: Obstetrics & Gynecology

## 2018-05-20 ENCOUNTER — Other Ambulatory Visit (HOSPITAL_COMMUNITY)
Admission: RE | Admit: 2018-05-20 | Discharge: 2018-05-20 | Disposition: A | Discharge status: Home / Self Care | Payer: Medicaid Other | Source: Ambulatory Visit | Attending: Obstetrics & Gynecology | Admitting: Obstetrics & Gynecology

## 2018-05-20 ENCOUNTER — Encounter: Payer: Self-pay | Admitting: Obstetrics & Gynecology

## 2018-05-20 DIAGNOSIS — O0992 Supervision of high risk pregnancy, unspecified, second trimester: Secondary | ICD-10-CM

## 2018-05-20 DIAGNOSIS — O0990 Supervision of high risk pregnancy, unspecified, unspecified trimester: Secondary | ICD-10-CM | POA: Diagnosis not present

## 2018-05-20 DIAGNOSIS — Z23 Encounter for immunization: Secondary | ICD-10-CM

## 2018-05-20 DIAGNOSIS — Z3A16 16 weeks gestation of pregnancy: Secondary | ICD-10-CM

## 2018-05-20 LAB — POCT URINALYSIS DIP (DEVICE)
Bilirubin Urine: NEGATIVE
Glucose, UA: NEGATIVE mg/dL
Hgb urine dipstick: NEGATIVE
Ketones, ur: NEGATIVE mg/dL
Leukocytes, UA: NEGATIVE
Nitrite: NEGATIVE
Protein, ur: NEGATIVE mg/dL
Specific Gravity, Urine: 1.02 (ref 1.005–1.030)
Urobilinogen, UA: 0.2 mg/dL (ref 0.0–1.0)
pH: 7 (ref 5.0–8.0)
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
## 2018-05-20 NOTE — Progress Notes (Signed)
Live Estanislado Spire Interpreter- [facility]

## 2018-05-20 NOTE — Patient Instructions (Signed)
Second Trimester of Pregnancy The second trimester is from week 13 through week 28, month 4 through 6. This is often the time in pregnancy that you feel your best. Often times, morning sickness has lessened or quit. You may have more energy, and you may get hungry more often. Your unborn baby (fetus) is growing rapidly. At the end of the sixth month, he or she is about 9 inches long and weighs about 1 pounds. You will likely feel the baby move (quickening) between 18 and 20 weeks of pregnancy. Follow these instructions at home:  Avoid all smoking, herbs, and alcohol. Avoid drugs not approved by your doctor.  Do not use any tobacco products, including cigarettes, chewing tobacco, and electronic cigarettes. If you need help quitting, ask your doctor. You may get counseling or other support to help you quit.  Only take medicine as told by your doctor. Some medicines are safe and some are not during pregnancy.  Exercise only as told by your doctor. Stop exercising if you start having cramps.  Eat regular, healthy meals.  Wear a good support bra if your breasts are tender.  Do not use hot tubs, steam rooms, or saunas.  Wear your seat belt when driving.  Avoid raw meat, uncooked cheese, and liter boxes and soil used by cats.  Take your prenatal vitamins.  Take 1500-2000 milligrams of calcium daily starting at the 20th week of pregnancy until you deliver your baby.  Try taking medicine that helps you poop (stool softener) as needed, and if your doctor approves. Eat more fiber by eating fresh fruit, vegetables, and whole grains. Drink enough fluids to keep your pee (urine) clear or pale yellow.  Take warm water baths (sitz baths) to soothe pain or discomfort caused by hemorrhoids. Use hemorrhoid cream if your doctor approves.  If you have puffy, bulging veins (varicose veins), wear support hose. Raise (elevate) your feet for 15 minutes, 3-4 times a day. Limit salt in your diet.  Avoid heavy  lifting, wear low heals, and sit up straight.  Rest with your legs raised if you have leg cramps or low back pain.  Visit your dentist if you have not gone during your pregnancy. Use a soft toothbrush to brush your teeth. Be gentle when you floss.  You can have sex (intercourse) unless your doctor tells you not to.  Go to your doctor visits. Get help if:  You feel dizzy.  You have mild cramps or pressure in your lower belly (abdomen).  You have a nagging pain in your belly area.  You continue to feel sick to your stomach (nauseous), throw up (vomit), or have watery poop (diarrhea).  You have bad smelling fluid coming from your vagina.  You have pain with peeing (urination). Get help right away if:  You have a fever.  You are leaking fluid from your vagina.  You have spotting or bleeding from your vagina.  You have severe belly cramping or pain.  You lose or gain weight rapidly.  You have trouble catching your breath and have chest pain.  You notice sudden or extreme puffiness (swelling) of your face, hands, ankles, feet, or legs.  You have not felt the baby move in over an hour.  You have severe headaches that do not go away with medicine.  You have vision changes. This information is not intended to replace advice given to you by your health care provider. Make sure you discuss any questions you have with your health care   provider. Document Released: 09/24/2009 Document Revised: 12/06/2015 Document Reviewed: 08/31/2012 Elsevier Interactive Patient Education  2017 Elsevier Inc.  

## 2018-05-21 LAB — PROTEIN / CREATININE RATIO, URINE
Creatinine, Urine: 164.9 mg/dL
PROTEIN/CREAT RATIO: 99 mg/g{creat} (ref 0–200)
Protein, Ur: 16.3 mg/dL

## 2018-05-24 LAB — CYTOLOGY - PAP
DIAGNOSIS: NEGATIVE
HPV (WINDOPATH): NOT DETECTED

## 2018-05-27 ENCOUNTER — Encounter: Payer: Self-pay | Admitting: Obstetrics & Gynecology

## 2018-05-27 DIAGNOSIS — R8271 Bacteriuria: Secondary | ICD-10-CM | POA: Insufficient documentation

## 2018-05-27 LAB — URINE CULTURE, OB REFLEX

## 2018-05-27 LAB — CULTURE, OB URINE

## 2018-05-31 ENCOUNTER — Encounter: Payer: Self-pay | Admitting: *Deleted

## 2018-05-31 ENCOUNTER — Telehealth: Payer: Self-pay | Admitting: *Deleted

## 2018-05-31 ENCOUNTER — Encounter (HOSPITAL_COMMUNITY): Payer: Self-pay

## 2018-05-31 DIAGNOSIS — B951 Streptococcus, group B, as the cause of diseases classified elsewhere: Secondary | ICD-10-CM

## 2018-05-31 DIAGNOSIS — O2340 Unspecified infection of urinary tract in pregnancy, unspecified trimester: Principal | ICD-10-CM

## 2018-05-31 MED ORDER — AMPICILLIN 500 MG PO CAPS: 500.0000 mg | ORAL_CAPSULE | Freq: Two times a day (BID) | ORAL | 0 refills | Status: DC

## 2018-05-31 NOTE — Telephone Encounter (Signed)
-----   Message from Adam PhenixJames G Arnold, MD sent at 05/27/2018  4:03 PM EST ----- GBS UTI, Rx ampicillin 500 mg BID 7 days

## 2018-05-31 NOTE — Telephone Encounter (Signed)
Interpreter Vung Ksor used to inform pt of UTI and the need for antibiotics.  Informed pt that [facility]  Pt instructed to take the medication 2 times a day for 7 days. Pt did not pick up.  Message left.

## 2018-06-01 ENCOUNTER — Telehealth: Payer: Self-pay | Admitting: Family Medicine

## 2018-06-01 LAB — OBSTETRIC PANEL, INCLUDING HIV
ANTIBODY SCREEN: NEGATIVE
BASOS: 0 %
Basophils Absolute: 0 10*3/uL (ref 0.0–0.2)
EOS (ABSOLUTE): 0.1 10*3/uL (ref 0.0–0.4)
Eos: 1 %
HEMATOCRIT: 36 % (ref 34.0–46.6)
HIV SCREEN 4TH GENERATION: NONREACTIVE
Hemoglobin: 11.4 g/dL (ref 11.1–15.9)
Hepatitis B Surface Ag: NEGATIVE
Immature Grans (Abs): 0 10*3/uL (ref 0.0–0.1)
Immature Granulocytes: 0 %
LYMPHS ABS: 1.4 10*3/uL (ref 0.7–3.1)
Lymphs: 16 %
MCH: 23.1 pg — AB (ref 26.6–33.0)
MCHC: 31.7 g/dL (ref 31.5–35.7)
MCV: 73 fL — AB (ref 79–97)
Monocytes Absolute: 0.4 10*3/uL (ref 0.1–0.9)
Monocytes: 5 %
NEUTROS ABS: 6.4 10*3/uL (ref 1.4–7.0)
Neutrophils: 78 %
Platelets: 227 10*3/uL (ref 150–450)
RBC: 4.94 x10E6/uL (ref 3.77–5.28)
RDW: 14.6 % (ref 12.3–15.4)
RPR Ser Ql: NONREACTIVE
Rh Factor: POSITIVE
Rubella Antibodies, IGG: 6.46 index (ref >0.99)
WBC: 8.3 10*3/uL (ref 3.4–10.8)

## 2018-06-01 LAB — SMN1 COPY NUMBER ANALYSIS (SMA CARRIER SCREENING)

## 2018-06-01 LAB — HEMOGLOBINOPATHY EVALUATION
Ferritin: 45 ng/mL (ref 15–150)
HGB A: 80.3 % — AB (ref 96.4–98.8)
HGB C: 0 %
HGB F QUANT: 0 % (ref 0.0–2.0)
HGB S: 0 %
Hgb A2 Quant: 4.9 % — ABNORMAL HIGH (ref 1.8–3.2)
Hgb Solubility: NEGATIVE
Hgb Variant: 14.8 % — ABNORMAL HIGH

## 2018-06-01 LAB — COMPREHENSIVE METABOLIC PANEL
ALBUMIN: 3.8 g/dL (ref 3.5–5.5)
ALT: 11 IU/L (ref 0–32)
AST: 13 IU/L (ref 0–40)
Albumin/Globulin Ratio: 1.5 (ref 1.2–2.2)
Alkaline Phosphatase: 53 IU/L (ref 39–117)
BUN / CREAT RATIO: 13 (ref 9–23)
BUN: 6 mg/dL (ref 6–20)
Bilirubin Total: <0.2 mg/dL (ref 0.0–1.2)
CO2: 18 mmol/L — AB (ref 20–29)
CREATININE: 0.46 mg/dL — AB (ref 0.57–1.00)
Calcium: 8.6 mg/dL — ABNORMAL LOW (ref 8.7–10.2)
Chloride: 104 mmol/L (ref 96–106)
GFR, EST AFRICAN AMERICAN: 148 mL/min/{1.73_m2} (ref >59)
GFR, EST NON AFRICAN AMERICAN: 128 mL/min/{1.73_m2} (ref >59)
GLOBULIN, TOTAL: 2.5 g/dL (ref 1.5–4.5)
GLUCOSE: 72 mg/dL (ref 65–99)
Potassium: 3.4 mmol/L — ABNORMAL LOW (ref 3.5–5.2)
SODIUM: 136 mmol/L (ref 134–144)
TOTAL PROTEIN: 6.3 g/dL (ref 6.0–8.5)

## 2018-06-01 LAB — ALPHA-THALASSEMIA

## 2018-06-01 LAB — CYSTIC FIBROSIS GENE TEST

## 2018-06-01 LAB — TSH: TSH: 1.19 u[IU]/mL (ref 0.450–4.500)

## 2018-06-01 NOTE — Telephone Encounter (Signed)
Pt called in stating that someone had called her yesterday. After waiting on hold while I tried to get an interpretor on the line for her she hung up.

## 2018-06-08 ENCOUNTER — Ambulatory Visit (HOSPITAL_COMMUNITY): Admission: RE | Admit: 2018-06-08 | Payer: Medicaid Other | Source: Ambulatory Visit

## 2018-06-17 ENCOUNTER — Ambulatory Visit (INDEPENDENT_AMBULATORY_CARE_PROVIDER_SITE_OTHER): Payer: Self-pay | Admitting: Obstetrics & Gynecology

## 2018-06-17 VITALS — BP 125/67 | HR 86 | Wt 130.1 lb

## 2018-06-17 DIAGNOSIS — O0992 Supervision of high risk pregnancy, unspecified, second trimester: Secondary | ICD-10-CM

## 2018-06-17 DIAGNOSIS — O34219 Maternal care for unspecified type scar from previous cesarean delivery: Secondary | ICD-10-CM

## 2018-06-17 DIAGNOSIS — O99612 Diseases of the digestive system complicating pregnancy, second trimester: Secondary | ICD-10-CM

## 2018-06-17 DIAGNOSIS — O99613 Diseases of the digestive system complicating pregnancy, third trimester: Secondary | ICD-10-CM

## 2018-06-17 DIAGNOSIS — K219 Gastro-esophageal reflux disease without esophagitis: Secondary | ICD-10-CM

## 2018-06-17 DIAGNOSIS — Z641 Problems related to multiparity: Secondary | ICD-10-CM | POA: Insufficient documentation

## 2018-06-17 DIAGNOSIS — Z789 Other specified health status: Secondary | ICD-10-CM

## 2018-06-17 DIAGNOSIS — R8271 Bacteriuria: Secondary | ICD-10-CM

## 2018-06-17 DIAGNOSIS — O09522 Supervision of elderly multigravida, second trimester: Secondary | ICD-10-CM

## 2018-06-17 DIAGNOSIS — Z3A20 20 weeks gestation of pregnancy: Secondary | ICD-10-CM

## 2018-06-17 LAB — OB RESULTS CONSOLE GBS
GBS: POSITIVE
GBS: POSITIVE

## 2018-06-17 MED ORDER — PRENATAL VITAMINS 0.8 MG PO TABS: 1.0000 | ORAL_TABLET | Freq: Every day | ORAL | 12 refills | Status: DC

## 2018-06-17 MED ORDER — FAMOTIDINE 20 MG PO TABS: 20.0000 mg | ORAL_TABLET | Freq: Two times a day (BID) | ORAL | 3 refills | Status: DC

## 2018-06-17 NOTE — Progress Notes (Signed)
   PRENATAL VISIT NOTE  Subjective:  Sabrina Daniels is a 37 y.o. [id number] at 313w3d being seen today for ongoing prenatal care.  She is currently monitored for the following issues for this high-risk pregnancy and has Sickle cell trait (HCC); Supervision of high-risk pregnancy; Previous cesarean delivery, antepartum condition or complication; Language barrier; AMA (advanced maternal age) multigravida 35+; and GBS bacteriuria on their problem list.  Patient reports some dysuria.  Contractions: Not present. Vag. Bleeding: None.  Movement: Absent. Denies leaking of fluid.   The following portions of the patient's history were reviewed and updated as appropriate: allergies, current medications, past family history, past medical history, past social history, past surgical history and problem list. Problem list updated.  Objective:   Vitals:   06/17/18 1011  BP: 125/67  Pulse: 86  Weight: 130 lb 1.6 oz (59 kg)    Fetal Status: Fetal Heart Rate (bpm): 135   Movement: Absent     General:  Alert, oriented and cooperative. Patient is in no acute distress.  Skin: Skin is warm and dry. No rash noted.   Cardiovascular: Normal heart rate noted  Respiratory: Normal respiratory effort, no problems with respiration noted  Abdomen: Soft, gravid, appropriate for gestational age.  Pain/Pressure: Present     Pelvic: Cervical exam deferred        Extremities: Normal range of motion.  Edema: None  Mental Status: Normal mood and affect. Normal behavior. Normal judgment and thought content.   Assessment and Plan:  Pregnancy: [id number] at 1213w3d  1. Supervision of high risk pregnancy in second trimester - anatomy u/s and genetic counseling next week  2. Previous cesarean delivery, antepartum condition or complication - She would like a TOLAC  3. Language barrier - live interpretor present  4. GBS bacteriuria - treat in labor - Culture, OB Urine  5. Multigravida of advanced maternal age in second  trimester - she is considering BTL  6. Gastroesophageal reflux during pregnancy in third trimester, antepartum  - famotidine (PEPCID) 20 MG tablet; Take 1 tablet (20 mg total) by mouth 2 (two) times daily.  Dispense: 60 tablet; Refill: 3  7. AMA (advanced maternal age) multigravida 35+, second trimester - NIPS today - AMB referral to maternal fetal medicine  Preterm labor symptoms and general obstetric precautions including but not limited to vaginal bleeding, contractions, leaking of fluid and fetal movement were reviewed in detail with the patient. Please refer to After Visit Summary for other counseling recommendations.  Return in about 4 weeks (around 07/15/2018).  Future Appointments  Date Time Provider Department Center  06/28/2018  9:00 AM [facility] US 3 [facility]  06/28/2018 10:00 AM [facility] GENETIC COUNSELING RM [facility]    Allie BossierMyra C Rhianon Zabawa, MD

## 2018-06-17 NOTE — Addendum Note (Signed)
Addended by: Osvaldo HumanEISMA, ASHLEY P on: 06/17/2018 10:51 AM   Modules accepted: Orders

## 2018-06-17 NOTE — Progress Notes (Signed)
Anatomy ultrasound scheduled for 06/28/18 @ 0900.

## 2018-06-19 LAB — CULTURE, OB URINE

## 2018-06-19 LAB — URINE CULTURE, OB REFLEX

## 2018-06-22 ENCOUNTER — Encounter: Payer: Self-pay | Admitting: *Deleted

## 2018-06-25 ENCOUNTER — Encounter (HOSPITAL_COMMUNITY): Payer: Self-pay

## 2018-06-28 ENCOUNTER — Other Ambulatory Visit (HOSPITAL_COMMUNITY): Payer: Medicaid Other

## 2018-06-28 ENCOUNTER — Encounter (HOSPITAL_COMMUNITY): Payer: Medicaid Other

## 2018-07-02 ENCOUNTER — Ambulatory Visit (HOSPITAL_COMMUNITY)
Admission: RE | Admit: 2018-07-02 | Discharge: 2018-07-02 | Disposition: A | Discharge status: Home / Self Care | Payer: Medicaid Other | Source: Ambulatory Visit | Attending: Obstetrics & Gynecology | Admitting: Obstetrics & Gynecology

## 2018-07-02 ENCOUNTER — Ambulatory Visit (HOSPITAL_COMMUNITY): Payer: Self-pay | Admitting: Obstetrics & Gynecology

## 2018-07-02 ENCOUNTER — Encounter (HOSPITAL_COMMUNITY): Payer: Self-pay

## 2018-07-02 DIAGNOSIS — O09522 Supervision of elderly multigravida, second trimester: Secondary | ICD-10-CM | POA: Diagnosis not present

## 2018-07-02 DIAGNOSIS — O34219 Maternal care for unspecified type scar from previous cesarean delivery: Secondary | ICD-10-CM | POA: Diagnosis not present

## 2018-07-02 DIAGNOSIS — O0992 Supervision of high risk pregnancy, unspecified, second trimester: Secondary | ICD-10-CM | POA: Insufficient documentation

## 2018-07-02 DIAGNOSIS — Z363 Encounter for antenatal screening for malformations: Secondary | ICD-10-CM

## 2018-07-02 DIAGNOSIS — O09212 Supervision of pregnancy with history of pre-term labor, second trimester: Secondary | ICD-10-CM | POA: Diagnosis not present

## 2018-07-02 DIAGNOSIS — O09292 Supervision of pregnancy with other poor reproductive or obstetric history, second trimester: Secondary | ICD-10-CM

## 2018-07-02 DIAGNOSIS — O0990 Supervision of high risk pregnancy, unspecified, unspecified trimester: Secondary | ICD-10-CM

## 2018-07-02 DIAGNOSIS — Z3A22 22 weeks gestation of pregnancy: Secondary | ICD-10-CM

## 2018-07-14 NOTE — L&D Delivery Note (Signed)
Patient: Sabrina Daniels MRN: [id number]  GBS status: Positive, IAP given:PCN   Patient is a 38 y.o. now [id number] s/p NSVD at [redacted]w[redacted]d, who was admitted for IOL for A2GDM. AROM 0h 12m prior to delivery with clear fluid.    Delivery Note At 5:54 PM a viable female was delivered via VBAC, Spontaneous (Presentation: LOA).  APGAR: 9, 9; weight pending.   Placenta status: spontaneous, intact.  Cord: 3 vessel  with the following complications: nuchal x1 and body cord.   Anesthesia:  None  Episiotomy: None Lacerations: None Suture Repair: None Est. Blood Loss (mL): 162  Head delivered LOA. Nuchal cord present x1, reduced at perineum. Shoulder and body delivered in usual fashion.n Body cord noted. Infant with spontaneous cry, placed on mother's abdomen, dried and bulb suctioned. Cord clamped x 2 after 1-minute delay, and cut by family member. Cord blood drawn. Placenta delivered spontaneously with gentle cord traction. Fundus firm with massage and Pitocin. Perineum inspected and found to have no lacerations.  Mom to postpartum.  Baby to Couplet care / Skin to Skin.  De Hollingshead 10/25/2018, 6:20 PM

## 2018-07-22 ENCOUNTER — Ambulatory Visit (INDEPENDENT_AMBULATORY_CARE_PROVIDER_SITE_OTHER): Payer: Medicaid Other | Admitting: Obstetrics & Gynecology

## 2018-07-22 VITALS — BP 112/73 | HR 79 | Wt 134.0 lb

## 2018-07-22 DIAGNOSIS — O34219 Maternal care for unspecified type scar from previous cesarean delivery: Secondary | ICD-10-CM

## 2018-07-22 DIAGNOSIS — O0992 Supervision of high risk pregnancy, unspecified, second trimester: Secondary | ICD-10-CM

## 2018-07-22 DIAGNOSIS — Z789 Other specified health status: Secondary | ICD-10-CM

## 2018-07-22 DIAGNOSIS — Z3A25 25 weeks gestation of pregnancy: Secondary | ICD-10-CM

## 2018-07-22 DIAGNOSIS — O09522 Supervision of elderly multigravida, second trimester: Secondary | ICD-10-CM

## 2018-07-22 NOTE — Progress Notes (Signed)
BTL paper signed

## 2018-07-22 NOTE — Progress Notes (Signed)
   PRENATAL VISIT NOTE  Subjective:  Sabrina Daniels is a 38 y.o. [id number] at [redacted]w[redacted]d being seen today for ongoing prenatal care.  She is currently monitored for the following issues for this low-risk pregnancy and has Sickle cell trait (HCC); Supervision of high-risk pregnancy; Previous cesarean delivery, antepartum condition or complication; Language barrier; AMA (advanced maternal age) multigravida 35+; GBS bacteriuria; and Grand multipara on their problem list.  Patient reports no complaints.  Contractions: Not present. Vag. Bleeding: None.  Movement: Present. Denies leaking of fluid.   The following portions of the patient's history were reviewed and updated as appropriate: allergies, current medications, past family history, past medical history, past social history, past surgical history and problem list. Problem list updated.  Objective:   Vitals:   07/22/18 1117  BP: 112/73  Pulse: 79  Weight: 134 lb (60.8 kg)    Fetal Status: Fetal Heart Rate (bpm): 157   Movement: Present     General:  Alert, oriented and cooperative. Patient is in no acute distress.  Skin: Skin is warm and dry. No rash noted.   Cardiovascular: Normal heart rate noted  Respiratory: Normal respiratory effort, no problems with respiration noted  Abdomen: Soft, gravid, appropriate for gestational age.  Pain/Pressure: Present     Pelvic: Cervical exam deferred        Extremities: Normal range of motion.  Edema: None  Mental Status: Normal mood and affect. Normal behavior. Normal judgment and thought content.   Assessment and Plan:  Pregnancy: [id number] at [redacted]w[redacted]d  1. Supervision of high risk pregnancy in second trimester   Preterm labor symptoms and general obstetric precautions including but not limited to vaginal bleeding, contractions, leaking of fluid and fetal movement were reviewed in detail with the patient. Please refer to After Visit Summary for other counseling recommendations.  No follow-ups on file.  No  future appointments.  Allie Bossier, MD

## 2018-07-29 ENCOUNTER — Encounter: Payer: Self-pay | Admitting: *Deleted

## 2018-08-08 ENCOUNTER — Inpatient Hospital Stay (HOSPITAL_COMMUNITY)
Admission: AD | Admit: 2018-08-08 | Discharge: 2018-08-08 | Disposition: A | Discharge status: Home / Self Care | Payer: Medicaid Other | Attending: Obstetrics and Gynecology | Admitting: Obstetrics and Gynecology

## 2018-08-08 ENCOUNTER — Encounter (HOSPITAL_COMMUNITY): Payer: Self-pay | Admitting: *Deleted

## 2018-08-08 DIAGNOSIS — O2342 Unspecified infection of urinary tract in pregnancy, second trimester: Secondary | ICD-10-CM | POA: Diagnosis not present

## 2018-08-08 DIAGNOSIS — Z3A27 27 weeks gestation of pregnancy: Secondary | ICD-10-CM | POA: Diagnosis not present

## 2018-08-08 DIAGNOSIS — O09522 Supervision of elderly multigravida, second trimester: Secondary | ICD-10-CM | POA: Insufficient documentation

## 2018-08-08 DIAGNOSIS — Z3689 Encounter for other specified antenatal screening: Secondary | ICD-10-CM

## 2018-08-08 DIAGNOSIS — R300 Dysuria: Secondary | ICD-10-CM | POA: Diagnosis present

## 2018-08-08 LAB — URINALYSIS, ROUTINE W REFLEX MICROSCOPIC
Bacteria, UA: NONE SEEN
Bilirubin Urine: NEGATIVE
Glucose, UA: NEGATIVE mg/dL
Hgb urine dipstick: NEGATIVE
Ketones, ur: NEGATIVE mg/dL
Nitrite: NEGATIVE
PH: 6 (ref 5.0–8.0)
Protein, ur: NEGATIVE mg/dL
Specific Gravity, Urine: 1.017 (ref 1.005–1.030)
WBC, UA: >50 WBC/hpf — ABNORMAL HIGH (ref 0–5)

## 2018-08-08 MED ORDER — CEPHALEXIN 500 MG PO CAPS: 500.0000 mg | ORAL_CAPSULE | Freq: Four times a day (QID) | ORAL | 0 refills | Status: AC

## 2018-08-08 MED ORDER — PHENAZOPYRIDINE HCL 200 MG PO TABS: 200.0000 mg | ORAL_TABLET | Freq: Three times a day (TID) | ORAL | 0 refills | Status: DC

## 2018-08-08 NOTE — MAU Note (Signed)
Sabrina Daniels is a 38 y.o. at [redacted]w[redacted]d here in MAU reporting: dysuria, increased urinary frequency, and states not a lot of urine comes out. No abdominal pain, no vaginal bleeding, + FM, no LOF  Onset of complaint: yesterday Pain score: 0/10 Vitals:   08/08/18 1050  BP: 112/72  Pulse: 80  Resp: 18  Temp: 97.6 F (36.4 C)     FHT:134 Lab orders placed from triage: UA

## 2018-08-08 NOTE — Discharge Instructions (Signed)
Pregnancy and Urinary Tract Infection  What is a urinary tract infection?    A urinary tract infection (UTI) is an infection of any part of the urinary tract. This includes the kidneys, the tubes that connect your kidneys to your bladder (ureters), the bladder, and the tube that carries urine out of your body (urethra). These organs make, store, and get rid of urine in the body.   An upper UTI affects the ureters and kidneys (pyelonephritis), and a lower UTI affects the bladder (cystitis) and urethra (urethritis). Most urinary tract infections are caused by bacteria in your genital area, around the entrance to your urinary tract (urethra). These bacteria grow and cause irritation and inflammation of your urinary tract.  Why am I more likely to get a UTI during pregnancy?  You are more likely to develop a UTI during pregnancy because:  · The physical and hormonal changes your body goes through can make it easier for bacteria to get into your urinary tract.  · Your growing baby puts pressure on your uterus and can affect urine flow.  Does a UTI place my baby at risk?  An untreated UTI during pregnancy could lead to a kidney infection, which can cause health problems that could affect your baby. Possible complications of an untreated UTI include:  · Having your baby before 37 weeks of pregnancy (premature).  · Having a baby with a low birth weight.  · Developing high blood pressure during pregnancy (preeclampsia).  · Having a low hemoglobin level (anemia).  What are the symptoms of a UTI?  Symptoms of a UTI include:  · Needing to urinate right away (urgently).  · Frequent urination or passing small amounts of urine frequently.  · Pain or burning with urination.  · Blood in the urine.  · Urine that smells bad or unusual.  · Trouble urinating.  · Cloudy urine.  · Pain in the abdomen or lower back.  · Vaginal discharge.  You may also have:  · Vomiting or a decreased appetite.  · Confusion.  · Irritability or  tiredness.  · A fever.  · Diarrhea.  What are the treatment options for a UTI during pregnancy?  Treatment for this condition may include:  · Antibiotic medicines that are safe to take during pregnancy.  · Other medicines to treat less common causes of UTI.  How can I prevent a UTI?  To prevent a UTI:  · Go to the bathroom as soon as you feel the need. Do not hold urine for long periods of time.  · Always wipe from front to back after a bowel movement. Use each tissue one time when you wipe.  · Empty your bladder after sex.  · Keep your genital area dry.  · Drink 6-10 glasses of water each day.  · Do not douche or use deodorant sprays.  Contact a health care provider if:  · Your symptoms do not improve or they get worse.  · You have abnormal vaginal discharge.  Get help right away if:  · You have a fever.  · You have nausea and vomiting.  · You have back or side pain.  · You feel contractions in your uterus.  · You have lower belly pain.  · You have a gush of fluid from your vagina.  · You have blood in your urine.  Summary  · A urinary tract infection (UTI) is an infection of any part of the urinary tract, which includes the kidneys, ureters,   bladder, and urethra.  · Most urinary tract infections are caused by bacteria in your genital area, around the entrance to your urinary tract (urethra).  · You are more likely to develop a UTI during pregnancy.  · If you were prescribed an antibiotic, take it as told by your health care provider. Do not stop taking the antibiotic even if you start to feel better.  This information is not intended to replace advice given to you by your health care provider. Make sure you discuss any questions you have with your health care provider.  Document Released: 10/25/2010 Document Revised: 08/25/2017 Document Reviewed: 05/21/2015  Elsevier Interactive Patient Education © 2019 Elsevier Inc.
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
## 2018-08-10 LAB — CULTURE, OB URINE: Culture: 40000 — AB

## 2018-08-11 ENCOUNTER — Other Ambulatory Visit: Payer: Self-pay | Admitting: *Deleted

## 2018-08-11 DIAGNOSIS — O34219 Maternal care for unspecified type scar from previous cesarean delivery: Secondary | ICD-10-CM

## 2018-08-11 DIAGNOSIS — O0992 Supervision of high risk pregnancy, unspecified, second trimester: Secondary | ICD-10-CM

## 2018-08-11 DIAGNOSIS — O09522 Supervision of elderly multigravida, second trimester: Secondary | ICD-10-CM

## 2018-08-12 ENCOUNTER — Other Ambulatory Visit: Payer: Medicaid Other

## 2018-08-12 ENCOUNTER — Ambulatory Visit (INDEPENDENT_AMBULATORY_CARE_PROVIDER_SITE_OTHER): Payer: Medicaid Other | Admitting: Advanced Practice Midwife

## 2018-08-12 VITALS — BP 114/72 | HR 79 | Wt 135.1 lb

## 2018-08-12 DIAGNOSIS — O0992 Supervision of high risk pregnancy, unspecified, second trimester: Secondary | ICD-10-CM

## 2018-08-12 DIAGNOSIS — Z23 Encounter for immunization: Secondary | ICD-10-CM

## 2018-08-12 DIAGNOSIS — Z3A28 28 weeks gestation of pregnancy: Secondary | ICD-10-CM | POA: Diagnosis not present

## 2018-08-12 DIAGNOSIS — O34219 Maternal care for unspecified type scar from previous cesarean delivery: Secondary | ICD-10-CM

## 2018-08-12 DIAGNOSIS — O09522 Supervision of elderly multigravida, second trimester: Secondary | ICD-10-CM

## 2018-08-12 NOTE — Progress Notes (Signed)
   PRENATAL VISIT NOTE  Subjective:  Sabrina Daniels is a 38 y.o. [id number] at [redacted]w[redacted]d being seen today for ongoing prenatal care.  She is currently monitored for the following issues for this high-risk pregnancy and has Sickle cell trait (HCC); Supervision of high-risk pregnancy; Previous cesarean delivery, antepartum condition or complication; Language barrier; AMA (advanced maternal age) multigravida 35+; GBS bacteriuria; and Grand multipara on their problem list.  Patient reports no complaints.  Contractions: Not present. Vag. Bleeding: None.  Movement: Present. Denies leaking of fluid.   The following portions of the patient's history were reviewed and updated as appropriate: allergies, current medications, past family history, past medical history, past social history, past surgical history and problem list. Problem list updated.  Seen in MAu 08/08/18. Dx UTI. Taking ABX as instructed.   Objective:   Vitals:   08/12/18 1041  BP: 114/72  Pulse: 79  Weight: 135 lb 1.6 oz (61.3 kg)    Fetal Status: Fetal Heart Rate (bpm): 143 Fundal Height: 29 cm Movement: Present  Presentation: Vertex  General:  Alert, oriented and cooperative. Patient is in no acute distress.  Skin: Skin is warm and dry. No rash noted.   Cardiovascular: Normal heart rate noted  Respiratory: Normal respiratory effort, no problems with respiration noted  Abdomen: Soft, gravid, appropriate for gestational age.  Pain/Pressure: Absent     Pelvic: Cervical exam deferred        Extremities: Normal range of motion.  Edema: None  Mental Status: Normal mood and affect. Normal behavior. Normal judgment and thought content.   Assessment and Plan:  Pregnancy: [id number] at [redacted]w[redacted]d  1. Supervision of high risk pregnancy in second trimester - 28 week labs - Tdap vaccine greater than or equal to 7yo IM  2 UTI - TOC lat Feb - Pyelo precautions.   Preterm labor symptoms and general obstetric precautions including but not limited to  vaginal bleeding, contractions, leaking of fluid and fetal movement were reviewed in detail with the patient. Please refer to After Visit Summary for other counseling recommendations.  Return in about 2 weeks (around 08/26/2018) for ROB.  No future appointments.  Dorathy Kinsman, CNM

## 2018-08-12 NOTE — Patient Instructions (Addendum)
Nhi?m tru?ng ????ng ti?u, Ng???i l??n Urinary Tract Infection, Adult  Nhi?m trng ???ng ti?u (Urinary Tract Infection, UTI) l m?t b?nh nhi?m trng ? b?t k? b? ph?n no c?a ???ng ti?u. ???ng ti?u bao g?m hai qu? th?n, ni?u qu?n, bng quang v ni?u ??o. Nh??ng c? quan na?y ta?o ra, ch??a va? tha?i n???c ti?u trong c? th? ra ngoi. Chuyn gia ch?m [place] s?c kh?e c th? s? d?ng cc tn khc ?? m t? b?nh nhi?m trng. UTI trn ?nh h??ng ??n ni?u qu?n v th?n (vim b? th?n). UTI d??i h?n ?nh h??ng ??n bng quang (vim bng quang) v ni?u ??o (vim ni?u ??o). Nguyn nhn g gy ra? H?u h?t cc tr??ng h?p nhi?m trng ???ng ti?u l do vi khu?n ? vng sinh d?c c?a qu v?, quanh l?i vo ???ng ti?u (ni?u ??o) gy ra. Nh?ng vi khu?n ny pht tri?n v gy vim ???ng ti?u c?a qu v?. ?i?u g lm t?ng nguy c?? Qu v? d? b? tnh tr?ng ny h?n n?u:  Quy? vi? ?ang ???c ??t m?t ?ng thng ti?u (n??m trong).  Qu v? khng th? ki?m sot khi no qu v? ti?u ti?n ho?c ??i ti?n (qu v? b? khng t? ch?).  Qu v? l n? gi?i v qu v?: ? S? d?ng thu?c di?t tinh trng ho?c mng ch?n ?? trnh [place]. ? C n?ng ?? estrogen th?p. ? Co? [place].  Qu v? c m?t s? gen nh?t ??nh lm t?ng nguy c? (di truy?n).  Qu v? c quan h? tnh d?c.  Qu v? dng thu?c khng sinh.  Qu v? c m?t tnh tr?ng khi?n cho dng n??c ti?u ch?y ch?m l?i, ch?ng h?n nh?: ? Tuy?n ti?n li?t ph ??i, n?u qu v? l nam gi?i. ? T?c ngh?n trong ni?u ??o (cht h?p). ? S?i th?n. ? Tnh tr?ng ? dy th?n kinh ?nh h??ng ??n kh? n?ng ki?m sot bng quang (bng quang th?n kinh). ? Khng u?ng ?? n??c, ho?c khng ?i ti?u th??ng xuyn.  Qu v? c m?t s? tnh tr?ng b?nh l nh?t ??nh, ch?ng h?n nh?: ? Ti?u ???ng. ? Co? h? th?ng pho?ng ch?ng b?nh t?t (h? mi?n di?ch) y?u. ? B?nh h?ng c?u hnh li?m. ? B?nh gu?t. ? Ch?n th??ng t?y s?ng. Cc d?u hi?u ho?c tri?u ch?ng l g? Nh?ng tri?u ch?ng c?a tnh tr?ng ny bao g?m:  C?n ?i ti?u ngay l?p t?c  (mt ti?u).  Ti?u ti?n th???ng xuyn ho??c ?i l???ng nho? n???c ti?u th???ng xuyn.  ?au ho??c no?ng rt khi ti?u ti?n.  Mu trong n??c ti?u.  N???c ti?u co? mu?i hi ho??c b?t th???ng.  Kho? ?i ti?u.  N???c ti?u v?n ?u?c.  Kh h? ?? m ?a?o, n?u quy? vi? la? phu? n??.  ?au ? vng b?ng d??i ho??c th?t l?ng. Qu v? c?ng c th? ???c:  Nn ho?c gi?m c?m gic thm ?n.  L l?n.  Kch thch ho?c m?t m?i.  S?t.  Tiu ch?y. Tri?u ch?ng ??u tin ? ng??i cao tu?i c th? l l l?n. Trong m?t s? tr??ng h?p, h? c th? khng c b?t k? tri?u ch?ng no cho ??n khi nhi?m trng tr? nn tr?m tr?ng h?n. Ch?n ?on tnh tr?ng ny nh? th? no? Tnh tr?ng ny c th? ???c ch?n ?on d?a vo khai thc b?nh s? v khm th?c th?[place]. Qu v? c?ng c th? ph?i lm cc ki?m tra khc, bao g?m:  Xt nghi?m n??c ti?u.  Xt nghi?m mu.  Xt nghi?m cc b?nh nhi?m trng ly  truy?n qua ???ng tnh d?c (STI). N?u quy? vi? bi? nhi?u h?n m?t b?nh UTI th c th? c?n ph?i th?c hi?n vi?c n?i soi ba?ng quang ho??c ca?c nghin c?u hi?nh a?nh ?? xa?c ?i?nh nguyn nhn gy nhi?m tru?ng. Tnh tr?ng ny ???c ?i?u tr? nh? th? no? ?i?u tr? tnh tr?ng ny bao g?m:  Thu?c khng sinh.  Thu?c khng k ??n ?? lm gi?m c?m gic kh ch?u.  U?ng ?u? n???c ?? c? th? lun ?u? n???c. N?u qu v? b? nhi?m trng th??ng xuyn ho?c c cc tnh tr?ng khc nh? s?i th?n, qu v? c th? c?n ?i khm chuyn gia ch?m [place] s?c kh?e chuyn v? ???ng ti?t ni?u (bc s? chuyn khoa ti?t ni?u). Trong cc tr??ng h?p hi?m g?p, nhi?m trng ???ng ti?u c th? gy ra nhi?m trng huy?t. Nhi?m trng huy?t l tnh tr?ng ?e d?a tnh m?ng x?y ra khi c? th? ?p ?ng v?i m?t tnh tr?ng nhi?m trng. Nhi?m trng huy?t ???c ?i?u tr? trong b?nh vi?n b?ng thu?c khng sinh, d?ch truy?n v cc thu?c khc ???ng t?nh m?ch (IV). Tun th? nh?ng h??ng d?n ny ? nh:  Thu?c  Ch? s? d?ng thu?c khng k ??n v thu?c k ??n theo ch? d?n c?a chuyn gia ch?m [place] s?c  kh?e.  N?u qu v? ???c k thu?c khng sinh, hy dng thu?c theo ch? d?n c?a chuyn gia ch?m [place] s?c kh?e. Khng d?ng s? d?ng thu?c khng sinh ngay c? khi qu v? b?t ??u c?m th?y ?? h?n. H??ng d?n chung  ??m b?o qu v?: ? ?i ti?u th???ng xuyn va? h?t bi. Khng nhi?n ?i ti?u trong th??i gian da?i. ? ?i ti?u sau khi quan h? tnh d?c. ? Lau t?? tr???c ra sau sau khi ?i ?a?i ti?n n?u quy? vi? la? n?? gi?i. S?? du?ng m?i t? kh?n gi?y m?t l?n khi quy? vi? lau.  U?ng ?? n??c ?? gi? cho n??c ti?u c mu vng nh?t.  Tun th? t?t c? cc l?n khm theo di theo ch? d?n c?a chuyn gia ch?m [place] s?c kh?e. ?i?u ny c vai tr quan tr?ng. Hy lin l?c v?i chuyn gia ch?m [place] s?c kh?e n?u:  Cc tri?u ch?ng c?a qu v? khng ?? h?n sau 1-2 ngy.  Cc tri?u ch?ng h?t v sau ? ti pht. Yu c?u tr? gip ngay l?p t?c n?u qu v? c:  ?au r?t nhi?u ?? l?ng ho?c b?ng d??i c?a quy? vi?.  S?t.  Bu?n nn ho?c nn. Tm t?t  Nhi?m tru?ng ????ng ti?u (UTI) la? m?t nhi?m tru?ng ?? b?t c?? ph?n na?o cu?a ????ng ti?u, bao g?m th?n, ni?u qua?n, ba?ng quang va? ni?u ?a?o.  H?u h?t cc tr??ng h?p nhi?m trng ???ng ti?u l do vi khu?n ? vng sinh d?c c?a qu v?, quanh l?i vo ???ng ti?u (ni?u ??o) gy ra.  ?i?u tr? tnh tr?ng ny th??ng bao g?m dng cc thu?c khng sinh.  N?u qu v? ???c k thu?c khng sinh, hy dng thu?c theo ch? d?n c?a chuyn gia ch?m [place] s?c kh?e. Khng d?ng s? d?ng thu?c khng sinh ngay c? khi qu v? b?t ??u c?m th?y ?? h?n.  Tun th? t?t c? cc l?n khm theo di theo ch? d?n c?a chuyn gia ch?m [place] s?c kh?e. ?i?u ny c vai tr quan tr?ng. Thng tin ny khng nh?m m?c ?ch thay th? cho l?i khuyn m chuyn gia ch?m  s?c kh?e ni v?i qu v?. Hy b?o ??m qu v? ph?i th?o lu?n b?t k?  v?n ?? g m qu v? c v?i chuyn gia ch?m [place] s?c kh?e c?a qu v?. Document Released: 06/30/2005 Document Revised: 03/01/2018 Document Reviewed: 03/01/2018 Elsevier Interactive Patient  Education  2019 [employer].

## 2018-08-13 ENCOUNTER — Encounter: Payer: Self-pay | Admitting: Advanced Practice Midwife

## 2018-08-13 ENCOUNTER — Other Ambulatory Visit: Payer: Self-pay | Admitting: Advanced Practice Midwife

## 2018-08-13 DIAGNOSIS — O09293 Supervision of pregnancy with other poor reproductive or obstetric history, third trimester: Secondary | ICD-10-CM

## 2018-08-13 DIAGNOSIS — O09219 Supervision of pregnancy with history of pre-term labor, unspecified trimester: Secondary | ICD-10-CM

## 2018-08-13 DIAGNOSIS — O24419 Gestational diabetes mellitus in pregnancy, unspecified control: Secondary | ICD-10-CM | POA: Insufficient documentation

## 2018-08-13 DIAGNOSIS — Z8632 Personal history of gestational diabetes: Secondary | ICD-10-CM | POA: Insufficient documentation

## 2018-08-13 DIAGNOSIS — Z8751 Personal history of pre-term labor: Secondary | ICD-10-CM | POA: Insufficient documentation

## 2018-08-13 LAB — CBC
HEMATOCRIT: 36.6 % (ref 34.0–46.6)
HEMOGLOBIN: 11.7 g/dL (ref 11.1–15.9)
MCH: 23.8 pg — ABNORMAL LOW (ref 26.6–33.0)
MCHC: 32 g/dL (ref 31.5–35.7)
MCV: 74 fL — ABNORMAL LOW (ref 79–97)
Platelets: 178 10*3/uL (ref 150–450)
RBC: 4.92 x10E6/uL (ref 3.77–5.28)
RDW: 14.5 % (ref 11.7–15.4)
WBC: 7.1 10*3/uL (ref 3.4–10.8)

## 2018-08-13 LAB — GLUCOSE TOLERANCE, 2 HOURS W/ 1HR
GLUCOSE, 2 HOUR: 143 mg/dL (ref 65–152)
Glucose, 1 hour: 230 mg/dL — ABNORMAL HIGH (ref 65–179)
Glucose, Fasting: 72 mg/dL (ref 65–91)

## 2018-08-13 LAB — HIV ANTIBODY (ROUTINE TESTING W REFLEX): HIV Screen 4th Generation wRfx: NONREACTIVE

## 2018-08-13 LAB — RPR: RPR Ser Ql: NONREACTIVE

## 2018-08-13 MED ORDER — ASPIRIN EC 81 MG PO TBEC: 81.0000 mg | DELAYED_RELEASE_TABLET | Freq: Every day | ORAL | 2 refills | Status: DC

## 2018-08-13 NOTE — Progress Notes (Signed)
Needs to start Baby ASA due to Hx HELLP Syndrome.

## 2018-08-18 ENCOUNTER — Telehealth: Payer: Self-pay | Admitting: *Deleted

## 2018-08-18 NOTE — Telephone Encounter (Signed)
-----   Message from [place] sent at 08/13/2018  1:47 PM EST ----- Dx GDM. Please inform pt. Needs DM teaching. Needs Estanislado Spire interpreter.

## 2018-08-18 NOTE — Telephone Encounter (Signed)
Attempted to call  patient with [employer]- without success- they do not have anyone on duty for [place] at this time.

## 2018-08-24 NOTE — Telephone Encounter (Signed)
Pt has office appt scheduled on 2/12 w/live interpreter. She will be informed of Dx GDM and recommended plan of care.

## 2018-08-25 ENCOUNTER — Other Ambulatory Visit: Payer: Self-pay | Admitting: General Practice

## 2018-08-25 ENCOUNTER — Encounter (HOSPITAL_COMMUNITY): Payer: Self-pay

## 2018-08-25 ENCOUNTER — Inpatient Hospital Stay (HOSPITAL_COMMUNITY)
Admission: AD | Admit: 2018-08-25 | Discharge: 2018-08-26 | Disposition: A | Discharge status: Home / Self Care | Payer: Medicaid Other | Attending: Obstetrics & Gynecology | Admitting: Obstetrics & Gynecology

## 2018-08-25 ENCOUNTER — Ambulatory Visit (INDEPENDENT_AMBULATORY_CARE_PROVIDER_SITE_OTHER): Payer: Medicaid Other | Admitting: Obstetrics & Gynecology

## 2018-08-25 VITALS — BP 113/62 | HR 84 | Wt 136.1 lb

## 2018-08-25 DIAGNOSIS — Z3A30 30 weeks gestation of pregnancy: Secondary | ICD-10-CM

## 2018-08-25 DIAGNOSIS — O09523 Supervision of elderly multigravida, third trimester: Secondary | ICD-10-CM

## 2018-08-25 DIAGNOSIS — O2343 Unspecified infection of urinary tract in pregnancy, third trimester: Secondary | ICD-10-CM | POA: Insufficient documentation

## 2018-08-25 DIAGNOSIS — O09213 Supervision of pregnancy with history of pre-term labor, third trimester: Secondary | ICD-10-CM

## 2018-08-25 DIAGNOSIS — O24419 Gestational diabetes mellitus in pregnancy, unspecified control: Secondary | ICD-10-CM | POA: Diagnosis not present

## 2018-08-25 DIAGNOSIS — N3001 Acute cystitis with hematuria: Secondary | ICD-10-CM | POA: Diagnosis not present

## 2018-08-25 DIAGNOSIS — D573 Sickle-cell trait: Secondary | ICD-10-CM

## 2018-08-25 DIAGNOSIS — R8271 Bacteriuria: Secondary | ICD-10-CM

## 2018-08-25 DIAGNOSIS — O09299 Supervision of pregnancy with other poor reproductive or obstetric history, unspecified trimester: Secondary | ICD-10-CM

## 2018-08-25 DIAGNOSIS — O09219 Supervision of pregnancy with history of pre-term labor, unspecified trimester: Principal | ICD-10-CM

## 2018-08-25 DIAGNOSIS — O4693 Antepartum hemorrhage, unspecified, third trimester: Secondary | ICD-10-CM | POA: Diagnosis present

## 2018-08-25 DIAGNOSIS — O34219 Maternal care for unspecified type scar from previous cesarean delivery: Secondary | ICD-10-CM

## 2018-08-25 DIAGNOSIS — O09293 Supervision of pregnancy with other poor reproductive or obstetric history, third trimester: Secondary | ICD-10-CM

## 2018-08-25 DIAGNOSIS — O09899 Supervision of other high risk pregnancies, unspecified trimester: Secondary | ICD-10-CM

## 2018-08-25 DIAGNOSIS — Z789 Other specified health status: Secondary | ICD-10-CM

## 2018-08-25 DIAGNOSIS — Z641 Problems related to multiparity: Secondary | ICD-10-CM

## 2018-08-25 LAB — URINALYSIS, ROUTINE W REFLEX MICROSCOPIC
Bilirubin Urine: NEGATIVE
Glucose, UA: NEGATIVE mg/dL
Ketones, ur: NEGATIVE mg/dL
Nitrite: NEGATIVE
PROTEIN: 100 mg/dL — AB
Specific Gravity, Urine: 1.015 (ref 1.005–1.030)
pH: 6.5 (ref 5.0–8.0)

## 2018-08-25 LAB — GLUCOSE, CAPILLARY: Glucose-Capillary: 110 mg/dL — ABNORMAL HIGH (ref 70–99)

## 2018-08-25 LAB — URINALYSIS, MICROSCOPIC (REFLEX)
RBC / HPF: >50 RBC/hpf (ref 0–5)
WBC, UA: >50 WBC/hpf (ref 0–5)

## 2018-08-25 LAB — POCT URINALYSIS DIP (DEVICE)
Bilirubin Urine: NEGATIVE
Glucose, UA: NEGATIVE mg/dL
Hgb urine dipstick: NEGATIVE
Ketones, ur: NEGATIVE mg/dL
Nitrite: NEGATIVE
Protein, ur: NEGATIVE mg/dL
Specific Gravity, Urine: 1.02 (ref 1.005–1.030)
Urobilinogen, UA: 1 mg/dL (ref 0.0–1.0)
pH: 7 (ref 5.0–8.0)

## 2018-08-25 LAB — WET PREP, GENITAL
Clue Cells Wet Prep HPF POC: NONE SEEN
Sperm: NONE SEEN
Trich, Wet Prep: NONE SEEN
Yeast Wet Prep HPF POC: NONE SEEN

## 2018-08-25 LAB — HEMOGLOBIN A1C
Est. average glucose Bld gHb Est-mCnc: 97 mg/dL
Hgb A1c MFr Bld: 5 % (ref 4.8–5.6)

## 2018-08-25 MED ORDER — ACCU-CHEK FASTCLIX LANCETS MISC: 1.0000 | Freq: Four times a day (QID) | 12 refills | Status: DC

## 2018-08-25 MED ORDER — ACCU-CHEK GUIDE W/DEVICE KIT: 1.0000 | PACK | Freq: Once | 0 refills | Status: DC

## 2018-08-25 MED ORDER — GLUCOSE BLOOD VI STRP: ORAL_STRIP | 12 refills | Status: DC

## 2018-08-25 MED ORDER — ACCU-CHEK FASTCLIX LANCETS MISC: 1.0000 [IU] | Freq: Four times a day (QID) | 12 refills | Status: DC

## 2018-08-25 NOTE — Progress Notes (Signed)
   PRENATAL VISIT NOTE  Subjective:  Sabrina Daniels is a 38 y.o. [id number] at [redacted]w[redacted]d being seen today for ongoing prenatal care.  She is currently monitored for the following issues for this high-risk pregnancy and has Sickle cell trait (HCC); Supervision of high-risk pregnancy; Previous cesarean delivery, antepartum condition or complication; Language barrier; AMA (advanced maternal age) multigravida 35+; GBS bacteriuria; Grand multipara; Gestational diabetes mellitus (GDM) affecting pregnancy, antepartum; History of HELLP syndrome; and History of preterm delivery, currently pregnant on their problem list.  Patient reports no complaints.  Contractions: Not present. Vag. Bleeding: None.  Movement: Present. Denies leaking of fluid.   The following portions of the patient's history were reviewed and updated as appropriate: allergies, current medications, past family history, past medical history, past social history, past surgical history and problem list. Problem list updated.  Objective:   Vitals:   08/25/18 0837  BP: 113/62  Pulse: 84  Weight: 136 lb 1.6 oz (61.7 kg)    Fetal Status: Fetal Heart Rate (bpm): 142   Movement: Present     General:  Alert, oriented and cooperative. Patient is in no acute distress.  Skin: Skin is warm and dry. No rash noted.   Cardiovascular: Normal heart rate noted  Respiratory: Normal respiratory effort, no problems with respiration noted  Abdomen: Soft, gravid, appropriate for gestational age.  Pain/Pressure: Present     Pelvic: Cervical exam deferred        Extremities: Normal range of motion.  Edema: None  Mental Status: Normal mood and affect. Normal behavior. Normal judgment and thought content.   Assessment and Plan:  Pregnancy: [id number] at [redacted]w[redacted]d  1. History of preterm delivery, currently pregnant   2. History of HELLP syndrome - on baby asa  3. Grand multipara - plans for BTL  4. GBS bacteriuria - treat in labor  5. Multigravida of advanced  maternal age in third trimester - low risk NIPS  6. Language barrier - live interpretor   7. Previous cesarean delivery, antepartum condition or complication - only 1 c/s, followed by 3 VBACs - she wants a TOLAC  8. Sickle cell trait (HCC)   9. Gestational diabetes mellitus (GDM) affecting pregnancy, antepartum - Her 1 hour showed 230 but today, about an hour after eating a sandwich, her sugar is only 110 - Supplies have been prescribed and she will attend a diabetes class and come back in a week with her sugars  Preterm labor symptoms and general obstetric precautions including but not limited to vaginal bleeding, contractions, leaking of fluid and fetal movement were reviewed in detail with the patient. Please refer to After Visit Summary for other counseling recommendations.  No follow-ups on file.  No future appointments.  Allie Bossier, MD
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
## 2018-08-25 NOTE — MAU Note (Signed)
Reports having pain and burning when she voids-also having pressure and urgency even when not using the restroom.  Also notes some blood on her tissue when she wipes.  No ctx/LOF.  +FM.

## 2018-08-25 NOTE — MAU Note (Signed)
Pt reported she went to the bathroom and wiped and saw some blood on the tissue. Denies any pain at this time. Good fetal movement felt.

## 2018-08-26 ENCOUNTER — Encounter: Payer: Self-pay | Admitting: Registered"

## 2018-08-26 ENCOUNTER — Encounter: Payer: Medicaid Other | Attending: Obstetrics & Gynecology | Admitting: Registered"

## 2018-08-26 DIAGNOSIS — O99810 Abnormal glucose complicating pregnancy: Secondary | ICD-10-CM | POA: Insufficient documentation

## 2018-08-26 MED ORDER — TERBUTALINE SULFATE 1 MG/ML IJ SOLN
0.2500 mg | Freq: Once | INTRAMUSCULAR | Status: AC
  Administered 2018-08-26: 0.25 mg via SUBCUTANEOUS
  Filled 2018-08-26: qty 1

## 2018-08-26 MED ORDER — CEPHALEXIN 500 MG PO CAPS
500.0000 mg | ORAL_CAPSULE | Freq: Once | ORAL | Status: AC
  Administered 2018-08-26: 500 mg via ORAL
  Filled 2018-08-26: qty 1

## 2018-08-26 MED ORDER — PHENAZOPYRIDINE HCL 100 MG PO TABS
100.0000 mg | ORAL_TABLET | Freq: Once | ORAL | Status: AC
  Administered 2018-08-26: 100 mg via ORAL
  Filled 2018-08-26: qty 1

## 2018-08-26 MED ORDER — CEPHALEXIN 500 MG PO CAPS: 500.0000 mg | ORAL_CAPSULE | Freq: Four times a day (QID) | ORAL | 0 refills | Status: DC

## 2018-08-26 MED ORDER — PHENAZOPYRIDINE HCL 100 MG PO TABS: 100.0000 mg | ORAL_TABLET | Freq: Three times a day (TID) | ORAL | 0 refills | Status: DC | PRN

## 2018-08-26 MED ORDER — NIFEDIPINE 10 MG PO CAPS
10.0000 mg | ORAL_CAPSULE | ORAL | Status: DC | PRN
  Administered 2018-08-26: 10 mg via ORAL
  Filled 2018-08-26 (×2): qty 1

## 2018-08-26 MED ORDER — PHENAZOPYRIDINE HCL 200 MG PO TABS: 200.0000 mg | ORAL_TABLET | Freq: Three times a day (TID) | ORAL | 1 refills | Status: DC | PRN

## 2018-08-26 MED ORDER — ACCU-CHEK GUIDE W/DEVICE KIT: 1.0000 | PACK | Freq: Once | 0 refills | Status: AC

## 2018-08-26 NOTE — Progress Notes (Signed)
Diabetes Self-Management Education  Visit Type: First/Initial  Appt. Start Time: 1055 Appt. End Time: 1210  08/26/2018  [name], identified by name and date of birth, is a 38 y.o. female with a diagnosis of Diabetes: Gestational Diabetes.   This patient is accompanied in the office by Sabrina Daniels Interpreter Sabrina Daniels).  ASSESSMENT  Last menstrual period 01/25/2018, currently breastfeeding. There is no height or weight on file to calculate BMI.   Pt states she was at [facility] until 2 am. Pt states she has urinary infection also was concerned about reflux.  Pt diet consists mostly of white rice, some noodles, vegetables and minimal protein intake, no nuts, occasionally peanut butter.  Pt was instructed on use of meter which she picked up from the pharmacy. BG was 193 mg/dL ~54 min after eating breakfast, an estimated 2 cups of rice (nothing else).  Diabetes Self-Management Education - 08/26/18 1057      Visit Information   Visit Type  First/Initial      Initial Visit   Diabetes Type  Gestational Diabetes    Are you currently following a meal plan?  No    Are you taking your medications as prescribed?  Not on Medications    Date Diagnosed  Aug 12 2018      Health Coping   How would you rate your overall health?  Excellent      Psychosocial Assessment   Patient Belief/Attitude about Diabetes  Other (comment)    How often do you need to have someone help you when you read instructions, pamphlets, or other written materials from your doctor or pharmacy?  5 - Always    What is the last grade level you completed in school?  0      Complications   Last HgB A1C per patient/outside source  5 %    How often do you check your blood sugar?  0 times/day (not testing)      Dietary Intake   Breakfast  2 c white rice    Lunch  rice    Dinner  rice, yucca leaves    Snack (evening)  none    Beverage(s)  water      Exercise   Exercise Type  Light (walking / raking  leaves)    How many days per week to you exercise?  1    How many minutes per day do you exercise?  15    Total minutes per week of exercise  15      Patient Education   Previous Diabetes Education  No    Nutrition management   Role of diet in the treatment of diabetes and the relationship between the three main macronutrients and blood glucose level    Monitoring  Taught/evaluated SMBG meter.    Acute complications  Taught treatment of hypoglycemia - the 15 rule.    Preconception care  Pregnancy and GDM  Role of pre-pregnancy blood glucose control on the development of the fetus;Reviewed with patient blood glucose goals with pregnancy      Individualized Goals (developed by patient)   Nutrition  General guidelines for healthy choices and portions discussed    Monitoring   test my blood glucose as discussed      Outcomes   Expected Outcomes  Demonstrated interest in learning. Expect positive outcomes    Future DMSE  PRN    Program Status  Completed      Individualized Plan for Diabetes Self-Management Training:   Learning  Objective:  Patient will have a greater understanding of diabetes self-management. Patient education plan is to attend individual and/or group sessions per assessed needs and concerns.   Patient Instructions  Aim to eat 3 balanced meals per day. Can try smaller meals with snacks to help with reflux. More exercise may also help control BG Consider continuing diet and exercise recommendations postpartum to reduce T2DM risk Check BG 4x day as directed by MD   Expected Outcomes:  Demonstrated interest in learning. Expect positive outcomes  Education material provided: BG Log sheet, MyPlate  If problems or questions, patient to contact team via:  Phone  Future DSME appointment: PRN

## 2018-08-26 NOTE — Discharge Instructions (Signed)
Preterm Labor and Birth Information Pregnancy normally lasts 39-41 weeks. Preterm labor is when labor starts early. It starts before you have been pregnant for 37 whole weeks. What are the risk factors for preterm labor? Preterm labor is more likely to occur in women who:  Have an infection while pregnant.  Have a cervix that is short.  Have gone into preterm labor before.  Have had surgery on their cervix.  Are younger than age 37.  Are older than age 37.  Are African American.  Are pregnant with two or more babies.  Take street drugs while pregnant.  Smoke while pregnant.  Do not gain enough weight while pregnant.  Got pregnant right after another pregnancy. What are the symptoms of preterm labor? Symptoms of preterm labor include:  Cramps. The cramps may feel like the cramps some women get during their period. The cramps may happen with watery poop (diarrhea).  Pain in the belly (abdomen).  Pain in the lower back.  Regular contractions or tightening. It may feel like your belly is getting tighter.  Pressure in the lower belly that seems to get stronger.  More fluid (discharge) leaking from the vagina. The fluid may be watery or bloody.  Water breaking. Why is it important to notice signs of preterm labor? Babies who are born early may not be fully developed. They have a higher chance for:  Long-term heart problems.  Long-term lung problems.  Trouble controlling body systems, like breathing.  Bleeding in the brain.  A condition called cerebral palsy.  Learning difficulties.  Death. These risks are highest for babies who are born before 34 weeks of pregnancy. How is preterm labor treated? Treatment depends on:  How long you were pregnant.  Your condition.  The health of your baby. Treatment may involve:  Having a stitch (suture) placed in your cervix. When you give birth, your cervix opens so the baby can come out. The stitch keeps the cervix  from opening too soon.  Staying at the hospital.  Taking or getting medicines, such as: ? Hormone medicines. ? Medicines to stop contractions. ? Medicines to help the babys lungs develop. ? Medicines to prevent your baby from having cerebral palsy. What should I do if I am in preterm labor? If you think you are going into labor too soon, call your doctor right away. How can I prevent preterm labor?  Do not use any tobacco products. ? Examples of these are cigarettes, chewing tobacco, and e-cigarettes. ? If you need help quitting, ask your doctor.  Do not use street drugs.  Do not use any medicines unless you ask your doctor if they are safe for you.  Talk with your doctor before taking any herbal supplements.  Make sure you gain enough weight.  Watch for infection. If you think you might have an infection, get it checked right away.  If you have gone into preterm labor before, tell your doctor. This information is not intended to replace advice given to you by your health care provider. Make sure you discuss any questions you have with your health care provider. Document Released: 09/26/2008 Document Revised: 12/11/2015 Document Reviewed: 11/21/2015 Elsevier Interactive Patient Education  2019 [employer] Pregnancy and Urinary Tract Infection What is a urinary tract infection?  A urinary tract infection (UTI) is an infection of any part of the urinary tract. This includes the kidneys, the tubes that connect your kidneys to your bladder (ureters), the bladder, and the tube that carries urine  out of your body (urethra). These organs make, store, and get rid of urine in the body.  An upper UTI affects the ureters and kidneys (pyelonephritis), and a lower UTI affects the bladder (cystitis) and urethra (urethritis). Most urinary tract infections are caused by bacteria in your genital area, around the entrance to your urinary tract (urethra). These bacteria grow and cause irritation  and inflammation of your urinary tract. Why am I more likely to get a UTI during pregnancy? You are more likely to develop a UTI during pregnancy because:  The physical and hormonal changes your body goes through can make it easier for bacteria to get into your urinary tract.  Your growing baby puts pressure on your uterus and can affect urine flow. Does a UTI place my baby at risk? An untreated UTI during pregnancy could lead to a kidney infection, which can cause health problems that could affect your baby. Possible complications of an untreated UTI include:  Having your baby before 37 weeks of pregnancy (premature).  Having a baby with a low birth weight.  Developing high blood pressure during pregnancy (preeclampsia).  Having a low hemoglobin level (anemia). What are the symptoms of a UTI? Symptoms of a UTI include:  Needing to urinate right away (urgently).  Frequent urination or passing small amounts of urine frequently.  Pain or burning with urination.  Blood in the urine.  Urine that smells bad or unusual.  Trouble urinating.  Cloudy urine.  Pain in the abdomen or lower back.  Vaginal discharge. You may also have:  Vomiting or a decreased appetite.  Confusion.  Irritability or tiredness.  A fever.  Diarrhea. What are the treatment options for a UTI during pregnancy? Treatment for this condition may include:  Antibiotic medicines that are safe to take during pregnancy.  Other medicines to treat less common causes of UTI. How can I prevent a UTI? To prevent a UTI:  Go to the bathroom as soon as you feel the need. Do not hold urine for long periods of time.  Always wipe from front to back after a bowel movement. Use each tissue one time when you wipe.  Empty your bladder after sex.  Keep your genital area dry.  Drink 6-10 glasses of water each day.  Do not douche or use deodorant sprays. Contact a health care provider if:  Your symptoms do  not improve or they get worse.  You have abnormal vaginal discharge. Get help right away if:  You have a fever.  You have nausea and vomiting.  You have back or side pain.  You feel contractions in your uterus.  You have lower belly pain.  You have a gush of fluid from your vagina.  You have blood in your urine. Summary  A urinary tract infection (UTI) is an infection of any part of the urinary tract, which includes the kidneys, ureters, bladder, and urethra.  Most urinary tract infections are caused by bacteria in your genital area, around the entrance to your urinary tract (urethra).  You are more likely to develop a UTI during pregnancy.  If you were prescribed an antibiotic, take it as told by your health care provider. Do not stop taking the antibiotic even if you start to feel better. This information is not intended to replace advice given to you by your health care provider. Make sure you discuss any questions you have with your health care provider. Document Released: 10/25/2010 Document Revised: 08/25/2017 Document Reviewed: 05/21/2015 Elsevier Interactive  Patient Education  2019 [employer].

## 2018-08-26 NOTE — Patient Instructions (Signed)
Aim to eat 3 balanced meals per day. Can try smaller meals with snacks to help with reflux. More exercise may also help control BG Consider continuing diet and exercise recommendations postpartum to reduce T2DM risk Check BG 4x day as directed by MD

## 2018-08-27 LAB — GC/CHLAMYDIA PROBE AMP (~~LOC~~) NOT AT ARMC
Chlamydia: NEGATIVE
NEISSERIA GONORRHEA: NEGATIVE

## 2018-08-28 LAB — CULTURE, OB URINE

## 2018-09-01 ENCOUNTER — Ambulatory Visit (INDEPENDENT_AMBULATORY_CARE_PROVIDER_SITE_OTHER): Payer: Medicaid Other | Admitting: Obstetrics & Gynecology

## 2018-09-01 VITALS — BP 110/73 | HR 81 | Wt 136.7 lb

## 2018-09-01 DIAGNOSIS — O09523 Supervision of elderly multigravida, third trimester: Secondary | ICD-10-CM

## 2018-09-01 DIAGNOSIS — D573 Sickle-cell trait: Secondary | ICD-10-CM

## 2018-09-01 DIAGNOSIS — Z758 Other problems related to medical facilities and other health care: Secondary | ICD-10-CM

## 2018-09-01 DIAGNOSIS — O0993 Supervision of high risk pregnancy, unspecified, third trimester: Secondary | ICD-10-CM

## 2018-09-01 DIAGNOSIS — O34219 Maternal care for unspecified type scar from previous cesarean delivery: Secondary | ICD-10-CM

## 2018-09-01 DIAGNOSIS — O09219 Supervision of pregnancy with history of pre-term labor, unspecified trimester: Principal | ICD-10-CM

## 2018-09-01 DIAGNOSIS — Z3A31 31 weeks gestation of pregnancy: Secondary | ICD-10-CM

## 2018-09-01 DIAGNOSIS — Z789 Other specified health status: Secondary | ICD-10-CM

## 2018-09-01 DIAGNOSIS — O09213 Supervision of pregnancy with history of pre-term labor, third trimester: Secondary | ICD-10-CM

## 2018-09-01 DIAGNOSIS — O09899 Supervision of other high risk pregnancies, unspecified trimester: Secondary | ICD-10-CM

## 2018-09-01 LAB — POCT URINALYSIS DIP (DEVICE)
Bilirubin Urine: NEGATIVE
Glucose, UA: NEGATIVE mg/dL
Hgb urine dipstick: NEGATIVE
Ketones, ur: NEGATIVE mg/dL
LEUKOCYTE UA: NEGATIVE
Nitrite: NEGATIVE
Protein, ur: NEGATIVE mg/dL
Specific Gravity, Urine: <=1.005 (ref 1.005–1.030)
Urobilinogen, UA: 0.2 mg/dL (ref 0.0–1.0)
pH: 5.5 (ref 5.0–8.0)

## 2018-09-01 NOTE — Progress Notes (Signed)
   PRENATAL VISIT NOTE  Subjective:  Sabrina Daniels is a 38 y.o. [id number] at [redacted]w[redacted]d being seen today for ongoing prenatal care.  She is currently monitored for the following issues for this high-risk pregnancy and has Sickle cell trait (HCC); Supervision of high-risk pregnancy; Previous cesarean delivery, antepartum condition or complication; Language barrier; AMA (advanced maternal age) multigravida 35+; GBS bacteriuria; Grand multipara; Gestational diabetes mellitus (GDM) affecting pregnancy, antepartum; History of HELLP syndrome; History of preterm delivery, currently pregnant; and Abnormal glucose tolerance test (GTT) during pregnancy, antepartum on their problem list.  Patient reports that she feels like she has a UTI again. I have told her that I will send a culture today and have rec'd she try OTC Uristat. Rec lots of water.   Contractions: Not present. Vag. Bleeding: None.  Movement: Present. Denies leaking of fluid.   The following portions of the patient's history were reviewed and updated as appropriate: allergies, current medications, past family history, past medical history, past social history, past surgical history and problem list. Problem list updated.  Objective:   Vitals:   09/01/18 1425  BP: 110/73  Pulse: 81  Weight: 136 lb 11.2 oz (62 kg)    Fetal Status: Fetal Heart Rate (bpm): 143   Movement: Present     General:  Alert, oriented and cooperative. Patient is in no acute distress.  Skin: Skin is warm and dry. No rash noted.   Cardiovascular: Normal heart rate noted  Respiratory: Normal respiratory effort, no problems with respiration noted  Abdomen: Soft, gravid, appropriate for gestational age.  Pain/Pressure: Absent     Pelvic: Cervical exam deferred        Extremities: Normal range of motion.  Edema: None  Mental Status: Normal mood and affect. Normal behavior. Normal judgment and thought content.   Assessment and Plan:  Pregnancy: [id number] at [redacted]w[redacted]d  -ALL normal  sugars (She has been checking them for the last week). She does not need to check any more and I told her that she does not have GDM. 1. History of preterm delivery, currently pregnant   2. Sickle cell trait (HCC) - urine culture  3. Supervision of high risk pregnancy in third trimester   4. Previous cesarean delivery, antepartum condition or complication - she has had 3 VBACs and wants a TOLAC  5. Language barrier - live interpretor present  6. Multigravida of advanced maternal age in third trimester -plans BTL  Preterm labor symptoms and general obstetric precautions including but not limited to vaginal bleeding, contractions, leaking of fluid and fetal movement were reviewed in detail with the patient. Please refer to After Visit Summary for other counseling recommendations.  No follow-ups on file.  No future appointments.  Allie Bossier, MD

## 2018-09-07 LAB — CULTURE, OB URINE

## 2018-09-07 LAB — URINE CULTURE, OB REFLEX

## 2018-09-08 ENCOUNTER — Other Ambulatory Visit: Payer: Self-pay | Admitting: Obstetrics & Gynecology

## 2018-09-13 ENCOUNTER — Telehealth: Payer: Self-pay | Admitting: *Deleted

## 2018-09-13 NOTE — Telephone Encounter (Signed)
-----   Message from Allie Bossier, MD sent at 09/08/2018  1:31 PM EST ----- So, I tried to prescribe keflex for a UTI seen on a recent culture. However, it appears to Epic that she is already taking keflex. Can you please let her know that if she did take it, then she needs another abx. And if she didn't take it, then she really needs to take it. Just let me know what she says. PS She speaks no [place]

## 2018-09-13 NOTE — Telephone Encounter (Signed)
Called patient mobile number  with [employer] #[phone] and left a message we are calling with some non urgent information - please call us back. Called home number and left  A message there also as with mobile.

## 2018-09-14 NOTE — Telephone Encounter (Signed)
Called [employer] & no interpreter was available to contact patient. Per chart review, Sabrina Daniels interpreter may be in office tomorrow.

## 2018-09-15 ENCOUNTER — Encounter: Payer: Self-pay | Admitting: *Deleted

## 2018-09-15 NOTE — Telephone Encounter (Signed)
No [place] interpreter in office. I called [facility] and they do not have a [place] interpreter at this time. I asked to schedule an appointment and they stated they do not have anyone that they can schedule an appointment with on staff at this time; but we can call at another time to see if that has changed.

## 2018-09-15 NOTE — Telephone Encounter (Signed)
Made a note on her next ob appt to discuss 3/ 10/20 and will send letter.

## 2018-09-20 ENCOUNTER — Other Ambulatory Visit: Payer: Self-pay

## 2018-09-20 ENCOUNTER — Ambulatory Visit (INDEPENDENT_AMBULATORY_CARE_PROVIDER_SITE_OTHER): Payer: Medicaid Other | Admitting: Obstetrics and Gynecology

## 2018-09-20 VITALS — BP 111/77 | HR 70 | Wt 134.9 lb

## 2018-09-20 DIAGNOSIS — R8271 Bacteriuria: Secondary | ICD-10-CM

## 2018-09-20 DIAGNOSIS — O09299 Supervision of pregnancy with other poor reproductive or obstetric history, unspecified trimester: Secondary | ICD-10-CM

## 2018-09-20 DIAGNOSIS — O0993 Supervision of high risk pregnancy, unspecified, third trimester: Secondary | ICD-10-CM

## 2018-09-20 DIAGNOSIS — O09523 Supervision of elderly multigravida, third trimester: Secondary | ICD-10-CM

## 2018-09-20 DIAGNOSIS — Z3A34 34 weeks gestation of pregnancy: Secondary | ICD-10-CM

## 2018-09-20 DIAGNOSIS — Z789 Other specified health status: Secondary | ICD-10-CM

## 2018-09-20 DIAGNOSIS — O2343 Unspecified infection of urinary tract in pregnancy, third trimester: Secondary | ICD-10-CM

## 2018-09-20 DIAGNOSIS — O24419 Gestational diabetes mellitus in pregnancy, unspecified control: Secondary | ICD-10-CM

## 2018-09-20 DIAGNOSIS — Z98891 History of uterine scar from previous surgery: Secondary | ICD-10-CM

## 2018-09-20 NOTE — Progress Notes (Signed)
Prenatal Visit Note Date: 09/20/2018 Clinic: [facility]  Subjective:  Sabrina Daniels is a 38 y.o. [id number] at [redacted]w[redacted]d being seen today for ongoing prenatal care.  She is currently monitored for the following issues for this high-risk pregnancy and has Sickle cell trait (HCC); Supervision of high-risk pregnancy; History of VBAC; Language barrier; AMA (advanced maternal age) multigravida 35+; GBS bacteriuria; Grand multipara; Gestational diabetes mellitus (GDM) affecting pregnancy, antepartum; History of HELLP syndrome; and History of preterm delivery, currently pregnant on their problem list.  Patient reports no complaints.   Contractions: Not present. Vag. Bleeding: None.  Movement: Present. Denies leaking of fluid.   The following portions of the patient's history were reviewed and updated as appropriate: allergies, current medications, past family history, past medical history, past social history, past surgical history and problem list. Problem list updated.  Objective:   Vitals:   09/20/18 0827  BP: 111/77  Pulse: 70  Weight: 134 lb 14.4 oz (61.2 kg)    Fetal Status: Fetal Heart Rate (bpm): 132 Fundal Height: 34 cm Movement: Present  Presentation: Vertex  General:  Alert, oriented and cooperative. Patient is in no acute distress.  Skin: Skin is warm and dry. No rash noted.   Cardiovascular: Normal heart rate noted  Respiratory: Normal respiratory effort, no problems with respiration noted  Abdomen: Soft, gravid, appropriate for gestational age. Pain/Pressure: Absent     Pelvic:  Cervical exam deferred        Extremities: Normal range of motion.  Edema: None  Mental Status: Normal mood and affect. Normal behavior. Normal judgment and thought content.   Urinalysis:      Assessment and Plan:  Pregnancy: [id number] at [redacted]w[redacted]d  1. Urinary tract infection in mother during third trimester of pregnancy Confirms taking abx. toc late march  2. Language barrier Live  interpreter today  3. Multigravida of advanced maternal age in third trimester  4. GBS bacteriuria tx in labor. See above  5. History of HELLP syndrome Confirms on los dose asa  6. History of VBAC Consent signed today  7. Gestational diabetes mellitus (GDM) affecting pregnancy, antepartum Pt told didn't have to check at last visit. D/w her recommending to keep checking throughout pregnancy. Will get surveillance growth for 37wks and get a1c and CBG for today. Pt states she had rice before her visit today - [place] MFM OB FOLLOW UP; Future  8. Supervision of high risk pregnancy in third trimester BTL papers already signed  Preterm labor symptoms and general obstetric precautions including but not limited to vaginal bleeding, contractions, leaking of fluid and fetal movement were reviewed in detail with the patient. Please refer to After Visit Summary for other counseling recommendations.  Return in about 10 days (around 09/30/2018) for 7-10d hrob.   [place] Bing, MD

## 2018-09-20 NOTE — Progress Notes (Signed)
Follow up ultrasound scheduled for 10/15/18 @ 0815

## 2018-09-20 NOTE — Progress Notes (Signed)
Pt has Live Interpreter

## 2018-09-21 LAB — HEMOGLOBIN A1C
Est. average glucose Bld gHb Est-mCnc: 94 mg/dL
Hgb A1c MFr Bld: 4.9 % (ref 4.8–5.6)

## 2018-09-21 LAB — GLUCOSE, RANDOM: Glucose: 96 mg/dL (ref 65–99)

## 2018-09-28 ENCOUNTER — Encounter: Payer: Self-pay | Admitting: *Deleted

## 2018-09-28 ENCOUNTER — Telehealth: Payer: Self-pay | Admitting: Student

## 2018-09-28 NOTE — Telephone Encounter (Signed)
Called the patient to inform of the restrictions due to the corona virus. The patient placed her husband on the phone to be sure she understood. The patient and husband agreed and does not have any questions.

## 2018-09-29 ENCOUNTER — Ambulatory Visit (INDEPENDENT_AMBULATORY_CARE_PROVIDER_SITE_OTHER): Payer: Medicaid Other | Admitting: Family Medicine

## 2018-09-29 ENCOUNTER — Other Ambulatory Visit: Payer: Self-pay

## 2018-09-29 VITALS — BP 112/81 | HR 75 | Temp 97.5°F | Wt 135.0 lb

## 2018-09-29 DIAGNOSIS — O26893 Other specified pregnancy related conditions, third trimester: Secondary | ICD-10-CM

## 2018-09-29 DIAGNOSIS — O98513 Other viral diseases complicating pregnancy, third trimester: Secondary | ICD-10-CM

## 2018-09-29 DIAGNOSIS — Z789 Other specified health status: Secondary | ICD-10-CM

## 2018-09-29 DIAGNOSIS — O0993 Supervision of high risk pregnancy, unspecified, third trimester: Secondary | ICD-10-CM

## 2018-09-29 DIAGNOSIS — B349 Viral infection, unspecified: Secondary | ICD-10-CM

## 2018-09-29 DIAGNOSIS — O24419 Gestational diabetes mellitus in pregnancy, unspecified control: Secondary | ICD-10-CM

## 2018-09-29 DIAGNOSIS — R300 Dysuria: Secondary | ICD-10-CM

## 2018-09-29 DIAGNOSIS — O09523 Supervision of elderly multigravida, third trimester: Secondary | ICD-10-CM

## 2018-09-29 DIAGNOSIS — Z3A35 35 weeks gestation of pregnancy: Secondary | ICD-10-CM

## 2018-09-29 LAB — POCT URINALYSIS DIP (DEVICE)
Bilirubin Urine: NEGATIVE
Glucose, UA: NEGATIVE mg/dL
Hgb urine dipstick: NEGATIVE
Ketones, ur: NEGATIVE mg/dL
Leukocytes,Ua: NEGATIVE
Nitrite: NEGATIVE
Protein, ur: NEGATIVE mg/dL
Specific Gravity, Urine: 1.02 (ref 1.005–1.030)
Urobilinogen, UA: 0.2 mg/dL (ref 0.0–1.0)
pH: 7.5 (ref 5.0–8.0)

## 2018-09-29 MED ORDER — BENZONATATE 100 MG PO CAPS: 200.0000 mg | ORAL_CAPSULE | Freq: Three times a day (TID) | ORAL | 0 refills | Status: DC | PRN

## 2018-09-29 MED ORDER — METFORMIN HCL 500 MG PO TABS: 500.0000 mg | ORAL_TABLET | Freq: Every day | ORAL | 0 refills | Status: DC

## 2018-09-29 MED ORDER — TRIAMCINOLONE ACETONIDE 55 MCG/ACT NA AERO: 2.0000 | INHALATION_SPRAY | Freq: Every day | NASAL | 12 refills | Status: DC

## 2018-09-29 NOTE — Progress Notes (Signed)
   PRENATAL VISIT NOTE  Subjective:  Sabrina Daniels is a 38 y.o. [id number] at [redacted]w[redacted]d being seen today for ongoing prenatal care.  She is currently monitored for the following issues for this high-risk pregnancy and has Sickle cell trait (HCC); Supervision of high-risk pregnancy; History of VBAC; Language barrier; AMA (advanced maternal age) multigravida 35+; GBS bacteriuria; Grand multipara; Gestational diabetes mellitus (GDM) affecting pregnancy, antepartum; History of HELLP syndrome; and History of preterm delivery, currently pregnant on their problem list.  Patient reports congestion, cough x 2-3 days, wants meds. Also with dysuria, frequency and urge incontinence..  Contractions: Not present. Vag. Bleeding: None.  Movement: Present. Denies leaking of fluid.   The following portions of the patient's history were reviewed and updated as appropriate: allergies, current medications, past family history, past medical history, past social history, past surgical history and problem list.   Objective:   Vitals:   09/29/18 0905  BP: 112/81  Pulse: 75  Temp: (!) 97.5 F (36.4 C)  Weight: 135 lb (61.2 kg)    Fetal Status: Fetal Heart Rate (bpm): 133 Fundal Height: 32 cm Movement: Present     General:  Alert, oriented and cooperative. Patient is in no acute distress.  Skin: Skin is warm and dry. No rash noted.   Cardiovascular: Normal heart rate noted  Respiratory: Normal respiratory effort, no problems with respiration noted  Abdomen: Soft, gravid, appropriate for gestational age.  Pain/Pressure: Absent     Pelvic: Cervical exam deferred        Extremities: Normal range of motion.  Edema: None  Mental Status: Normal mood and affect. Normal behavior. Normal judgment and thought content.   Assessment and Plan:  Pregnancy: [id number] at [redacted]w[redacted]d 1. Supervision of high risk pregnancy in third trimester Continue prenatal care. Cultures next visit   2. Gestational diabetes mellitus (GDM) affecting  pregnancy, antepartum FBS 72-87 2 hour pp 92-195 (8 of 18 are out of range) Begin metformin, due to end of pregnancy - metFORMIN (GLUCOPHAGE) 500 MG tablet; Take 1 tablet (500 mg total) by mouth daily with breakfast.  Dispense: 30 tablet; Refill: 0  3. Language barrier [place] interpreter used, live  4. Multigravida of advanced maternal age in third trimester Low risk NIPT  5. Dysuria Repeat culture and treat accordingly Bladder training. - Culture, OB Urine  6. Viral infection Treat presumptively. Will work on staying home. - benzonatate (TESSALON PERLES) 100 MG capsule; Take 2 capsules (200 mg total) by mouth 3 (three) times daily as needed for cough.  Dispense: 20 capsule; Refill: 0 - triamcinolone (NASACORT) 55 MCG/ACT AERO nasal inhaler; Place 2 sprays into the nose daily.  Dispense: 1 Inhaler; Refill: 12  Preterm labor symptoms and general obstetric precautions including but not limited to vaginal bleeding, contractions, leaking of fluid and fetal movement were reviewed in detail with the patient. Please refer to After Visit Summary for other counseling recommendations.   Return in 1 week (on 10/06/2018).  Future Appointments  Date Time Provider Department Center  10/13/2018 10:35 AM Reva Bores, MD [facility]  10/15/2018  8:05 AM [facility] NURSE [facility]  10/15/2018  8:15 AM [facility] [place] 4 [facility]    Reva Bores, MD

## 2018-09-29 NOTE — Patient Instructions (Signed)

## 2018-10-12 ENCOUNTER — Telehealth: Payer: Self-pay | Admitting: Student

## 2018-10-12 NOTE — Telephone Encounter (Signed)
Called the patient to inform of the COVID19 restrictions via voicemail on the line ending in [phone].

## 2018-10-13 ENCOUNTER — Other Ambulatory Visit (HOSPITAL_COMMUNITY)
Admission: RE | Admit: 2018-10-13 | Discharge: 2018-10-13 | Disposition: A | Discharge status: Home / Self Care | Payer: Medicaid Other | Source: Ambulatory Visit | Attending: Family Medicine | Admitting: Family Medicine

## 2018-10-13 ENCOUNTER — Other Ambulatory Visit: Payer: Self-pay | Admitting: *Deleted

## 2018-10-13 ENCOUNTER — Ambulatory Visit (INDEPENDENT_AMBULATORY_CARE_PROVIDER_SITE_OTHER): Payer: Medicaid Other | Admitting: Family Medicine

## 2018-10-13 ENCOUNTER — Other Ambulatory Visit: Payer: Self-pay

## 2018-10-13 VITALS — BP 116/83 | HR 78 | Wt 135.4 lb

## 2018-10-13 DIAGNOSIS — O0993 Supervision of high risk pregnancy, unspecified, third trimester: Secondary | ICD-10-CM | POA: Diagnosis not present

## 2018-10-13 DIAGNOSIS — O24415 Gestational diabetes mellitus in pregnancy, controlled by oral hypoglycemic drugs: Secondary | ICD-10-CM

## 2018-10-13 DIAGNOSIS — Z3A37 37 weeks gestation of pregnancy: Secondary | ICD-10-CM

## 2018-10-13 DIAGNOSIS — R8271 Bacteriuria: Secondary | ICD-10-CM

## 2018-10-13 DIAGNOSIS — O98813 Other maternal infectious and parasitic diseases complicating pregnancy, third trimester: Secondary | ICD-10-CM

## 2018-10-13 DIAGNOSIS — B951 Streptococcus, group B, as the cause of diseases classified elsewhere: Secondary | ICD-10-CM

## 2018-10-13 DIAGNOSIS — O24419 Gestational diabetes mellitus in pregnancy, unspecified control: Secondary | ICD-10-CM

## 2018-10-13 LAB — OB RESULTS CONSOLE GBS: GBS: NEGATIVE

## 2018-10-13 LAB — OB RESULTS CONSOLE GC/CHLAMYDIA: Gonorrhea: NEGATIVE

## 2018-10-13 MED ORDER — METFORMIN HCL 1000 MG PO TABS: 1000.0000 mg | ORAL_TABLET | Freq: Every day | ORAL | 0 refills | Status: DC

## 2018-10-13 NOTE — Progress Notes (Signed)
   PRENATAL VISIT NOTE  Subjective:  Sabrina Daniels is a 38 y.o. [id number] at [redacted]w[redacted]d being seen today for ongoing prenatal care.  She is currently monitored for the following issues for this high-risk pregnancy and has Sickle cell trait (HCC); Supervision of high-risk pregnancy; History of VBAC; Language barrier; AMA (advanced maternal age) multigravida 35+; GBS bacteriuria; Grand multipara; Gestational diabetes mellitus (GDM) affecting pregnancy, antepartum; History of HELLP syndrome; and History of preterm delivery, currently pregnant on their problem list.  Patient reports no complaints.  Contractions: Not present. Vag. Bleeding: None.  Movement: Present. Denies leaking of fluid.   The following portions of the patient's history were reviewed and updated as appropriate: allergies, current medications, past family history, past medical history, past social history, past surgical history and problem list.   Objective:   Vitals:   10/13/18 1046  BP: 116/83  Pulse: 78  Weight: 135 lb 6.4 oz (61.4 kg)    Fetal Status: Fetal Heart Rate (bpm): 138 Fundal Height: 34 cm Movement: Present  Presentation: Vertex  General:  Alert, oriented and cooperative. Patient is in no acute distress.  Skin: Skin is warm and dry. No rash noted.   Cardiovascular: Normal heart rate noted  Respiratory: Normal respiratory effort, no problems with respiration noted  Abdomen: Soft, gravid, appropriate for gestational age.  Pain/Pressure: Absent     Pelvic: Cervical exam performed        Extremities: Normal range of motion.  Edema: None  Mental Status: Normal mood and affect. Normal behavior. Normal judgment and thought content.   Assessment and Plan:  Pregnancy: [id number] at [redacted]w[redacted]d 1. Supervision of high risk pregnancy in third trimester - Culture, beta strep (group b only) - GC/Chlamydia probe amp ([place])not at [facility]  2. Gestational diabetes mellitus (GDM) affecting pregnancy, antepartum FBS 75-85 2 hour pp  83-186 (24 of 36 out of range) Increase Metformin to 1000 mg q am-- Has u/s for growth and BPP Friday IOL scheduled - metFORMIN (GLUCOPHAGE) 1000 MG tablet; Take 1 tablet (1,000 mg total) by mouth daily with breakfast.  Dispense: 30 tablet; Refill: 0  3. GBS bacteriuria - Urine Culture  Term labor symptoms and general obstetric precautions including but not limited to vaginal bleeding, contractions, leaking of fluid and fetal movement were reviewed in detail with the patient. Please refer to After Visit Summary for other counseling recommendations.   Return in 5 days (on 10/18/2018) for  BPP Monday-Diane to review CBGs.  Future Appointments  Date Time Provider Department Center  10/15/2018  8:05 AM [facility] NURSE [facility]  10/15/2018  8:15 AM [facility]-M[facility] [place] 4 [facility]-MFCUS MFC-US  10/18/2018 10:15 AM [facility] NST [facility]  10/25/2018  6:30 AM [facility] SCHED ROOM [facility]-IN[facility] None    Reva Bores, MD

## 2018-10-13 NOTE — Addendum Note (Signed)
Addended by: Jill Side on: 10/13/2018 09:32 AM   Modules accepted: Orders

## 2018-10-14 LAB — URINE CULTURE: Organism ID, Bacteria: NO GROWTH

## 2018-10-14 LAB — GC/CHLAMYDIA PROBE AMP (~~LOC~~) NOT AT ARMC
Chlamydia: NEGATIVE
Neisseria Gonorrhea: NEGATIVE

## 2018-10-15 ENCOUNTER — Encounter (HOSPITAL_COMMUNITY): Payer: Self-pay

## 2018-10-15 ENCOUNTER — Ambulatory Visit (HOSPITAL_COMMUNITY): Payer: Medicaid Other | Admitting: *Deleted

## 2018-10-15 ENCOUNTER — Other Ambulatory Visit: Payer: Self-pay

## 2018-10-15 ENCOUNTER — Ambulatory Visit (HOSPITAL_COMMUNITY)
Admission: RE | Admit: 2018-10-15 | Discharge: 2018-10-15 | Disposition: A | Discharge status: Home / Self Care | Payer: Medicaid Other | Source: Ambulatory Visit | Attending: Obstetrics and Gynecology | Admitting: Obstetrics and Gynecology

## 2018-10-15 VITALS — BP 120/79 | HR 77 | Temp 97.8°F

## 2018-10-15 DIAGNOSIS — Z362 Encounter for other antenatal screening follow-up: Secondary | ICD-10-CM | POA: Diagnosis not present

## 2018-10-15 DIAGNOSIS — O24419 Gestational diabetes mellitus in pregnancy, unspecified control: Secondary | ICD-10-CM | POA: Insufficient documentation

## 2018-10-15 DIAGNOSIS — O24415 Gestational diabetes mellitus in pregnancy, controlled by oral hypoglycemic drugs: Secondary | ICD-10-CM | POA: Diagnosis present

## 2018-10-15 DIAGNOSIS — O09523 Supervision of elderly multigravida, third trimester: Secondary | ICD-10-CM

## 2018-10-15 DIAGNOSIS — O09213 Supervision of pregnancy with history of pre-term labor, third trimester: Secondary | ICD-10-CM

## 2018-10-15 DIAGNOSIS — Z3A37 37 weeks gestation of pregnancy: Secondary | ICD-10-CM

## 2018-10-15 DIAGNOSIS — O34219 Maternal care for unspecified type scar from previous cesarean delivery: Secondary | ICD-10-CM | POA: Diagnosis not present

## 2018-10-15 DIAGNOSIS — Z862 Personal history of diseases of the blood and blood-forming organs and certain disorders involving the immune mechanism: Secondary | ICD-10-CM

## 2018-10-15 DIAGNOSIS — O09293 Supervision of pregnancy with other poor reproductive or obstetric history, third trimester: Secondary | ICD-10-CM

## 2018-10-17 LAB — CULTURE, BETA STREP (GROUP B ONLY): Strep Gp B Culture: NEGATIVE

## 2018-10-18 ENCOUNTER — Other Ambulatory Visit: Payer: Self-pay

## 2018-10-18 ENCOUNTER — Ambulatory Visit: Payer: Self-pay

## 2018-10-18 ENCOUNTER — Ambulatory Visit (INDEPENDENT_AMBULATORY_CARE_PROVIDER_SITE_OTHER): Payer: Medicaid Other | Admitting: *Deleted

## 2018-10-18 VITALS — BP 116/73 | HR 80 | Temp 98.4°F | Wt 135.5 lb

## 2018-10-18 DIAGNOSIS — Z3A38 38 weeks gestation of pregnancy: Secondary | ICD-10-CM | POA: Diagnosis not present

## 2018-10-18 DIAGNOSIS — O24415 Gestational diabetes mellitus in pregnancy, controlled by oral hypoglycemic drugs: Secondary | ICD-10-CM | POA: Diagnosis present

## 2018-10-18 NOTE — Progress Notes (Signed)
Interpreter Donavan Burnet present for encounter. IOL scheduled 4/13 @ 0630.  Pt informed that the ultrasound is considered a limited OB ultrasound and is not intended to be a complete ultrasound exam.  Patient also informed that the ultrasound is not being completed with the intent of assessing for fetal or placental anomalies or any pelvic abnormalities.  Explained that the purpose of today's ultrasound is to assess for presentation, BPP and amniotic fluid volume.  Patient acknowledges the purpose of the exam and the limitations of the study.

## 2018-10-19 ENCOUNTER — Other Ambulatory Visit: Payer: Self-pay

## 2018-10-19 ENCOUNTER — Inpatient Hospital Stay (HOSPITAL_COMMUNITY)
Admission: AD | Admit: 2018-10-19 | Discharge: 2018-10-19 | Disposition: A | Discharge status: Home / Self Care | Payer: Medicaid Other | Attending: Obstetrics & Gynecology | Admitting: Obstetrics & Gynecology

## 2018-10-19 ENCOUNTER — Encounter (HOSPITAL_COMMUNITY): Payer: Self-pay

## 2018-10-19 DIAGNOSIS — Z3689 Encounter for other specified antenatal screening: Secondary | ICD-10-CM | POA: Diagnosis not present

## 2018-10-19 DIAGNOSIS — O24419 Gestational diabetes mellitus in pregnancy, unspecified control: Secondary | ICD-10-CM | POA: Diagnosis not present

## 2018-10-19 DIAGNOSIS — Z3A38 38 weeks gestation of pregnancy: Secondary | ICD-10-CM | POA: Diagnosis not present

## 2018-10-19 DIAGNOSIS — O368130 Decreased fetal movements, third trimester, not applicable or unspecified: Secondary | ICD-10-CM

## 2018-10-19 DIAGNOSIS — Z7984 Long term (current) use of oral hypoglycemic drugs: Secondary | ICD-10-CM | POA: Insufficient documentation

## 2018-10-19 DIAGNOSIS — O24415 Gestational diabetes mellitus in pregnancy, controlled by oral hypoglycemic drugs: Secondary | ICD-10-CM | POA: Diagnosis not present

## 2018-10-19 DIAGNOSIS — Z7982 Long term (current) use of aspirin: Secondary | ICD-10-CM | POA: Diagnosis not present
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
## 2018-10-19 NOTE — Discharge Instructions (Signed)

## 2018-10-19 NOTE — MAU Note (Signed)
Pt presents to MAU with complaints of a decrease in fetal movement since last night. Denies any pain, vaginal bleeding or LOF

## 2018-10-22 ENCOUNTER — Other Ambulatory Visit (HOSPITAL_COMMUNITY): Payer: Self-pay | Admitting: *Deleted

## 2018-10-25 ENCOUNTER — Inpatient Hospital Stay (HOSPITAL_COMMUNITY)
Admission: AD | Admit: 2018-10-25 | Discharge: 2018-10-27 | DRG: 806 | Disposition: A | Discharge status: Home / Self Care | Payer: Medicaid Other | Attending: Obstetrics & Gynecology | Admitting: Obstetrics & Gynecology

## 2018-10-25 ENCOUNTER — Other Ambulatory Visit: Payer: Self-pay

## 2018-10-25 ENCOUNTER — Encounter (HOSPITAL_COMMUNITY): Payer: Self-pay | Admitting: *Deleted

## 2018-10-25 ENCOUNTER — Inpatient Hospital Stay (HOSPITAL_COMMUNITY): Payer: Medicaid Other

## 2018-10-25 DIAGNOSIS — O24425 Gestational diabetes mellitus in childbirth, controlled by oral hypoglycemic drugs: Secondary | ICD-10-CM | POA: Diagnosis present

## 2018-10-25 DIAGNOSIS — D573 Sickle-cell trait: Secondary | ICD-10-CM | POA: Diagnosis present

## 2018-10-25 DIAGNOSIS — O09219 Supervision of pregnancy with history of pre-term labor, unspecified trimester: Secondary | ICD-10-CM

## 2018-10-25 DIAGNOSIS — Z3A39 39 weeks gestation of pregnancy: Secondary | ICD-10-CM

## 2018-10-25 DIAGNOSIS — O9902 Anemia complicating childbirth: Secondary | ICD-10-CM | POA: Diagnosis present

## 2018-10-25 DIAGNOSIS — O99824 Streptococcus B carrier state complicating childbirth: Secondary | ICD-10-CM | POA: Diagnosis present

## 2018-10-25 DIAGNOSIS — O34219 Maternal care for unspecified type scar from previous cesarean delivery: Secondary | ICD-10-CM | POA: Diagnosis present

## 2018-10-25 DIAGNOSIS — O24424 Gestational diabetes mellitus in childbirth, insulin controlled: Secondary | ICD-10-CM | POA: Diagnosis not present

## 2018-10-25 DIAGNOSIS — O0993 Supervision of high risk pregnancy, unspecified, third trimester: Secondary | ICD-10-CM

## 2018-10-25 DIAGNOSIS — O09899 Supervision of other high risk pregnancies, unspecified trimester: Secondary | ICD-10-CM

## 2018-10-25 DIAGNOSIS — O24419 Gestational diabetes mellitus in pregnancy, unspecified control: Secondary | ICD-10-CM | POA: Diagnosis present

## 2018-10-25 DIAGNOSIS — O09299 Supervision of pregnancy with other poor reproductive or obstetric history, unspecified trimester: Secondary | ICD-10-CM

## 2018-10-25 DIAGNOSIS — Z758 Other problems related to medical facilities and other health care: Secondary | ICD-10-CM | POA: Diagnosis present

## 2018-10-25 DIAGNOSIS — Z98891 History of uterine scar from previous surgery: Secondary | ICD-10-CM

## 2018-10-25 DIAGNOSIS — Z8632 Personal history of gestational diabetes: Secondary | ICD-10-CM | POA: Diagnosis present

## 2018-10-25 DIAGNOSIS — R8271 Bacteriuria: Secondary | ICD-10-CM | POA: Diagnosis present

## 2018-10-25 DIAGNOSIS — Z789 Other specified health status: Secondary | ICD-10-CM | POA: Diagnosis present

## 2018-10-25 DIAGNOSIS — Z641 Problems related to multiparity: Secondary | ICD-10-CM

## 2018-10-25 DIAGNOSIS — O09529 Supervision of elderly multigravida, unspecified trimester: Secondary | ICD-10-CM

## 2018-10-25 LAB — TYPE AND SCREEN
ABO/RH(D): O POS
Antibody Screen: NEGATIVE

## 2018-10-25 LAB — CBC
HCT: 37.2 % (ref 36.0–46.0)
Hemoglobin: 12.3 g/dL (ref 12.0–15.0)
MCH: 24.7 pg — ABNORMAL LOW (ref 26.0–34.0)
MCHC: 33.1 g/dL (ref 30.0–36.0)
MCV: 74.7 fL — ABNORMAL LOW (ref 80.0–100.0)
Platelets: 142 10*3/uL — ABNORMAL LOW (ref 150–400)
RBC: 4.98 MIL/uL (ref 3.87–5.11)
RDW: 14.7 % (ref 11.5–15.5)
WBC: 7.4 10*3/uL (ref 4.0–10.5)
nRBC: 0 % (ref 0.0–0.2)

## 2018-10-25 LAB — GLUCOSE, CAPILLARY
Glucose-Capillary: 65 mg/dL — ABNORMAL LOW (ref 70–99)
Glucose-Capillary: 75 mg/dL (ref 70–99)

## 2018-10-25 LAB — RPR: RPR Ser Ql: NONREACTIVE

## 2018-10-25 LAB — GLUCOSE, RANDOM: Glucose, Bld: 82 mg/dL (ref 70–99)

## 2018-10-25 LAB — ABO/RH: ABO/RH(D): O POS

## 2018-10-25 MED ORDER — FLEET ENEMA 7-19 GM/118ML RE ENEM: 1.0000 | ENEMA | RECTAL | Status: DC | PRN

## 2018-10-25 MED ORDER — METHYLERGONOVINE MALEATE 0.2 MG/ML IJ SOLN
INTRAMUSCULAR | Status: AC
  Administered 2018-10-25: 0.2 mg
  Filled 2018-10-25: qty 1

## 2018-10-25 MED ORDER — ACETAMINOPHEN 325 MG PO TABS: 650.0000 mg | ORAL_TABLET | ORAL | Status: DC | PRN

## 2018-10-25 MED ORDER — ONDANSETRON HCL 4 MG/2ML IJ SOLN: 4.0000 mg | Freq: Four times a day (QID) | INTRAMUSCULAR | Status: DC | PRN

## 2018-10-25 MED ORDER — OXYTOCIN BOLUS FROM INFUSION: 500.0000 mL | Freq: Once | INTRAVENOUS | Status: DC

## 2018-10-25 MED ORDER — LACTATED RINGERS IV SOLN
INTRAVENOUS | Status: DC
  Administered 2018-10-25 (×2): via INTRAVENOUS

## 2018-10-25 MED ORDER — WITCH HAZEL-GLYCERIN EX PADS: 1.0000 "application " | MEDICATED_PAD | CUTANEOUS | Status: DC | PRN

## 2018-10-25 MED ORDER — SENNOSIDES-DOCUSATE SODIUM 8.6-50 MG PO TABS
2.0000 | ORAL_TABLET | ORAL | Status: DC
  Administered 2018-10-26 – 2018-10-27 (×2): 2 via ORAL
  Filled 2018-10-25 (×2): qty 2

## 2018-10-25 MED ORDER — OXYCODONE-ACETAMINOPHEN 5-325 MG PO TABS: 2.0000 | ORAL_TABLET | ORAL | Status: DC | PRN

## 2018-10-25 MED ORDER — IBUPROFEN 600 MG PO TABS
600.0000 mg | ORAL_TABLET | Freq: Four times a day (QID) | ORAL | Status: DC
  Administered 2018-10-26 – 2018-10-27 (×7): 600 mg via ORAL
  Filled 2018-10-25 (×8): qty 1

## 2018-10-25 MED ORDER — COCONUT OIL OIL: 1.0000 "application " | TOPICAL_OIL | Status: DC | PRN

## 2018-10-25 MED ORDER — ACETAMINOPHEN 500 MG PO TABS
1000.0000 mg | ORAL_TABLET | Freq: Once | ORAL | Status: AC
  Administered 2018-10-25: 1000 mg via ORAL
  Filled 2018-10-25: qty 2

## 2018-10-25 MED ORDER — TERBUTALINE SULFATE 1 MG/ML IJ SOLN: 0.2500 mg | Freq: Once | INTRAMUSCULAR | Status: DC | PRN

## 2018-10-25 MED ORDER — LIDOCAINE HCL (PF) 1 % IJ SOLN
30.0000 mL | INTRAMUSCULAR | Status: DC | PRN
  Filled 2018-10-25: qty 30

## 2018-10-25 MED ORDER — METHYLERGONOVINE MALEATE 0.2 MG PO TABS: 0.2000 mg | ORAL_TABLET | Freq: Once | ORAL | Status: AC

## 2018-10-25 MED ORDER — SOD CITRATE-CITRIC ACID 500-334 MG/5ML PO SOLN: 30.0000 mL | ORAL | Status: DC | PRN

## 2018-10-25 MED ORDER — SIMETHICONE 80 MG PO CHEW: 80.0000 mg | CHEWABLE_TABLET | ORAL | Status: DC | PRN

## 2018-10-25 MED ORDER — ZOLPIDEM TARTRATE 5 MG PO TABS: 5.0000 mg | ORAL_TABLET | Freq: Every evening | ORAL | Status: DC | PRN

## 2018-10-25 MED ORDER — LACTATED RINGERS IV SOLN: 500.0000 mL | INTRAVENOUS | Status: DC | PRN

## 2018-10-25 MED ORDER — BENZOCAINE-MENTHOL 20-0.5 % EX AERO
1.0000 "application " | INHALATION_SPRAY | CUTANEOUS | Status: DC | PRN
  Administered 2018-10-26: 1 via TOPICAL
  Filled 2018-10-25: qty 56

## 2018-10-25 MED ORDER — DIPHENHYDRAMINE HCL 25 MG PO CAPS: 25.0000 mg | ORAL_CAPSULE | Freq: Four times a day (QID) | ORAL | Status: DC | PRN

## 2018-10-25 MED ORDER — OXYTOCIN 40 UNITS IN NORMAL SALINE INFUSION - SIMPLE MED: 2.5000 [IU]/h | INTRAVENOUS | Status: DC

## 2018-10-25 MED ORDER — SODIUM CHLORIDE 0.9 % IV SOLN
5.0000 10*6.[IU] | Freq: Once | INTRAVENOUS | Status: AC
  Administered 2018-10-25: 5 10*6.[IU] via INTRAVENOUS
  Filled 2018-10-25: qty 5

## 2018-10-25 MED ORDER — PRENATAL MULTIVITAMIN CH
1.0000 | ORAL_TABLET | Freq: Every day | ORAL | Status: DC
  Administered 2018-10-26 – 2018-10-27 (×2): 1 via ORAL
  Filled 2018-10-25 (×2): qty 1

## 2018-10-25 MED ORDER — OXYTOCIN 40 UNITS IN NORMAL SALINE INFUSION - SIMPLE MED
1.0000 m[IU]/min | INTRAVENOUS | Status: DC
  Administered 2018-10-25 (×2): 2 m[IU]/min via INTRAVENOUS
  Filled 2018-10-25: qty 1000

## 2018-10-25 MED ORDER — MISOPROSTOL 200 MCG PO TABS
ORAL_TABLET | ORAL | Status: AC
  Filled 2018-10-25: qty 4

## 2018-10-25 MED ORDER — MEASLES, MUMPS & RUBELLA VAC IJ SOLR: 0.5000 mL | Freq: Once | INTRAMUSCULAR | Status: DC

## 2018-10-25 MED ORDER — PENICILLIN G 3 MILLION UNITS IVPB - SIMPLE MED
3.0000 10*6.[IU] | INTRAVENOUS | Status: DC
  Administered 2018-10-25: 16:00:00 3 10*6.[IU] via INTRAVENOUS
  Filled 2018-10-25 (×2): qty 100

## 2018-10-25 MED ORDER — TETANUS-DIPHTH-ACELL PERTUSSIS 5-2.5-18.5 LF-MCG/0.5 IM SUSP: 0.5000 mL | Freq: Once | INTRAMUSCULAR | Status: DC

## 2018-10-25 MED ORDER — OXYCODONE-ACETAMINOPHEN 5-325 MG PO TABS: 1.0000 | ORAL_TABLET | ORAL | Status: DC | PRN

## 2018-10-25 MED ORDER — ONDANSETRON HCL 4 MG/2ML IJ SOLN: 4.0000 mg | INTRAMUSCULAR | Status: DC | PRN

## 2018-10-25 MED ORDER — MISOPROSTOL 200 MCG PO TABS
800.0000 ug | ORAL_TABLET | Freq: Once | ORAL | Status: AC
  Administered 2018-10-25: 19:00:00 800 ug via RECTAL

## 2018-10-25 MED ORDER — ONDANSETRON HCL 4 MG PO TABS: 4.0000 mg | ORAL_TABLET | ORAL | Status: DC | PRN

## 2018-10-25 MED ORDER — DIBUCAINE (PERIANAL) 1 % EX OINT: 1.0000 "application " | TOPICAL_OINTMENT | CUTANEOUS | Status: DC | PRN

## 2018-10-25 NOTE — Lactation Note (Signed)
Lactation Consultation Note  Patient Name: Sabrina Daniels Today's Date: 10/25/2018 Reason for consult: Initial assessment   Jari interpreter used.  Mom does speak some [place].  Mom bf 3 of her 6 babies for 3-4 months.  Youngest child is 2 yrs. Old.  Mom had difficulty bf with latch and then milk supply.  LC offered assistance.  Helped to position mom upright with pillow support for latching infant in football hold.  Mom states her nipples are tender, they were slightly pink.  Infant placed STS, frenulum visible when infant opens to latch.  Mom was able to easily hand express colostrum.  LC worked with mom to encouraged more head/neck support for infant when latching.  Infant latched well, mom comfortable, continuous rhythmic jaw movement noted with swallows heard when massage and compression used.  Infant fed for 20 mintues then was swaddled and placed in bassinet per mom's request.    BF basics reviewed with mom.  Mom was given information for support group online, lactation phone line and OP services.    LC encouraged mom to call out for further concerns, questions, or assistance regarding feeds.   Maternal Data Has patient been taught Hand Expression?: Yes Does the patient have breastfeeding experience prior to this delivery?: Yes  Feeding Feeding Type: Breast Fed  LATCH Score Latch: Grasps breast easily, tongue down, lips flanged, rhythmical sucking.  Audible Swallowing: A few with stimulation  Type of Nipple: Everted at rest and after stimulation  Comfort (Breast/Nipple): Filling, red/small blisters or bruises, mild/mod discomfort  Hold (Positioning): Assistance needed to correctly position infant at breast and maintain latch.  LATCH Score: 7  Interventions Interventions: Breast feeding basics reviewed;Assisted with latch;Skin to skin;Breast massage;Hand express;Breast compression;Position options;Support pillows;Adjust position  Lactation Tools Discussed/Used WIC Program:  Yes   Consult Status Consult Status: Follow-up Date: 10/26/18 Follow-up type: In-patient    Maryruth Hancock [facility] 10/25/2018, 10:53 PM

## 2018-10-25 NOTE — Progress Notes (Signed)
LABOR PROGRESS NOTE  Sabrina Daniels is a 38 y.o. [id number] at [redacted]w[redacted]d  admitted for IOL for A2GDM.   Subjective: Patient comfortable. Interpreter at bedside. No concerns.   Objective: BP 121/79   Pulse 75   Temp 98.4 F (36.9 C) (Oral)   Resp 16   LMP 01/25/2018 (Approximate)  or  Vitals:   10/25/18 1601 10/25/18 1622 10/25/18 1632 10/25/18 1701  BP: (!) 120/93  116/68 121/79  Pulse: 85  71 75  Resp: 16  18 16   Temp:  98.4 F (36.9 C)    TempSrc:  Oral      Dilation: 5 Effacement (%): 60 Station: -1 Presentation: Vertex Exam by:: Dr. Earlene Plater FHT: baseline rate 120, moderate varibility, +acel, no decel Toco: q3-4 min   Labs: Lab Results  Component Value Date   WBC 7.4 10/25/2018   HGB 12.3 10/25/2018   HCT 37.2 10/25/2018   MCV 74.7 (L) 10/25/2018   PLT 142 (L) 10/25/2018    Patient Active Problem List   Diagnosis Date Noted  . Gestational diabetes 10/25/2018  . Gestational diabetes mellitus (GDM) affecting pregnancy, antepartum 08/13/2018  . History of preterm delivery, currently pregnant 08/13/2018  . Grand multipara 06/17/2018  . GBS bacteriuria 05/27/2018  . Language barrier 06/30/2016  . AMA (advanced maternal age) multigravida 35+ 06/30/2016  . History of VBAC 04/08/2013  . Supervision of high-risk pregnancy 05/29/2011  . Sickle cell trait (HCC) 04/29/2011  . History of HELLP syndrome 07/15/2007    Assessment / Plan: Sabrina y.o. [id number] at [redacted]w[redacted]d here for IOL for A2GDM. Last CBG 75.   Labor: Induction. Pitocin now at 10 mu/min, titrate as appropriate. AROM performed at 1715 with clear fluid.  Fetal Wellbeing:  Cat I  Pain Control:  Maternal support. Epidural if desires.  Anticipated MOD:  NSVD.   Sabrina Daniels, D.O. OB Fellow  10/25/2018, 5:28 PM
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
## 2018-10-25 NOTE — Progress Notes (Signed)
Called to room by RN for passage of several large blood clots on pads after delivery. QBL at time of delivery 162. Total blood loss now ~750 cc. Patient with h/o PPH and has risk factor of multiparity. Fundus firm. Given Cytotec 800 mg rectally and Methergine 0.2 mg IM. Vital signs stable and patient feels well. Continue to monitor.   [name], D.O. [facility] Family Medicine Fellow, [facility], [facility] 10/25/2018, 7:40 PM

## 2018-10-25 NOTE — Progress Notes (Signed)
LABOR PROGRESS NOTE  Sabrina Daniels is a 38 y.o. S8P1031 at [redacted]w[redacted]d  admitted for IOL for A2GDM.   Subjective: Patient comfortable. Interpreter and FOB at bedside.   Objective: BP 110/70   Pulse 73   Temp 97.9 F (36.6 C) (Oral)   Resp (P) 20   LMP 01/25/2018 (Approximate)  or  Vitals:   10/25/18 1133 10/25/18 1221 10/25/18 1302 10/25/18 1332  BP: 110/80 115/78 121/77 110/70  Pulse: 80 74 67 73  Resp: 18 18 18  (P) 20  Temp: 97.9 F (36.6 C)     TempSrc: Oral       Dilation: 3.5 Effacement (%): 50 Station: -3 Presentation: Vertex Exam by:: Henderson Newcomer, RN FHT: baseline rate 120, moderate varibility, +acel, no decel Toco: Irritability   Labs: Lab Results  Component Value Date   WBC 7.4 10/25/2018   HGB 12.3 10/25/2018   HCT 37.2 10/25/2018   MCV 74.7 (L) 10/25/2018   PLT 142 (L) 10/25/2018    Patient Active Problem List   Diagnosis Date Noted  . Gestational diabetes 10/25/2018  . Gestational diabetes mellitus (GDM) affecting pregnancy, antepartum 08/13/2018  . History of preterm delivery, currently pregnant 08/13/2018  . Grand multipara 06/17/2018  . GBS bacteriuria 05/27/2018  . Language barrier 06/30/2016  . AMA (advanced maternal age) multigravida 35+ 06/30/2016  . History of VBAC 04/08/2013  . Supervision of high-risk pregnancy 05/29/2011  . Sickle cell trait (HCC) 04/29/2011  . History of HELLP syndrome 07/15/2007    Assessment / Plan: 38 y.o. [id number] at [redacted]w[redacted]d here for IOL for A2GDM. Last CBG 65.   Labor: Induction. Noted that patient was actually GBS+ from urine culture earlier during pregnancy. PCN was started. Pitocin held until 2nd dose of PCN started due to grand multip. Pitocin now at 4 mu/min, titrate as appropriate.  Fetal Wellbeing:  Cat I  Pain Control:  Maternal support. Epidural if desires.  Anticipated MOD:  NSVD.   [name], D.O. OB Fellow  10/25/2018, 1:40 PM

## 2018-10-25 NOTE — Discharge Summary (Signed)
OB Discharge Summary     Patient Name: Sabrina Daniels DOB: August 10, 1980 MRN: [id number]  Date of admission: 10/25/2018 Delivering MD: Arvilla Market   Date of discharge: 10/27/2018  Admitting diagnosis: Single intrauterine pregnancy at [redacted]w[redacted]d                                      A2GDM Intrauterine pregnancy: [redacted]w[redacted]d     Secondary diagnosis:  Active Problems:   Sickle cell trait (HCC)   History of VBAC   Language barrier   AMA (advanced maternal age) multigravida 35+   GBS bacteriuria   Grand multipara   Gestational diabetes mellitus (GDM) affecting pregnancy, antepartum   History of HELLP syndrome   Gestational diabetes Postpartum Hemorrhage  Additional problems: Postpartum hemorrhage     Discharge diagnosis: Term Pregnancy Delivered and GDM A2                                                                         Postpartum Hemorrhage  Post partum procedures:DepoProvera  Augmentation: AROM and Pitocin  Complications: None  Hospital course:  Induction of Labor With Vaginal Delivery   38 y.o. yo [id number] at [redacted]w[redacted]d was admitted to the hospital 10/25/2018 for induction of labor.  Indication for induction: A2 DM.  Patient had an uncomplicated labor course as follows: Membrane Rupture Time/Date: 5:18 PM ,10/25/2018   Intrapartum Procedures: Episiotomy: None [1]                                         Lacerations:  None [1]  Patient had delivery of a Viable infant.  Information for the patient's newborn:  Agate, Girl Rosemaria [[id number]]  Delivery Method: VBAC, Spontaneous(Filed from Delivery Summary)  About an hour after delivery, she had a postpartum hemorrhage with total blood loss estimated to be .  She was given Cytotec and Methergine with good resolution Post hemorrhage Hemoglobin was 10.1g/dl  7/85/8850  Details of delivery can be found in separate delivery note.  Patient had a routine postpartum course except for postpartum hemorrhage described above.. Patient is  discharged home 10/27/18.  Physical exam  Vitals:   10/26/18 0515 10/26/18 0915 10/26/18 1348 10/26/18 2223  BP: 105/64 103/77 96/76 113/62  Pulse: 74 74 67 73  Resp: 18 18 16 16   Temp: 98.7 F (37.1 C) 98.5 F (36.9 C) 97.9 F (36.6 C) 97.6 F (36.4 C)  TempSrc: Oral Oral Oral Oral   General: alert, cooperative and no distress Lochia: appropriate Uterine Fundus: firm Incision: N/A DVT Evaluation: No evidence of DVT seen on physical exam.  Labs: Lab Results  Component Value Date   WBC 9.4 10/26/2018   HGB 10.1 (L) 10/26/2018   HCT 31.1 (L) 10/26/2018   MCV 73.5 (L) 10/26/2018   PLT 149 (L) 10/26/2018   CMP Latest Ref Rng & Units 10/25/2018  Glucose 70 - 99 mg/dL 82  BUN 6 - 20 mg/dL -  Creatinine 2.77 - 4.12 mg/dL -  Sodium 878 - 676 mmol/L -  Potassium 3.5 - 5.2  mmol/L -  Chloride 96 - 106 mmol/L -  CO2 20 - 29 mmol/L -  Calcium 8.7 - 10.2 mg/dL -  Total Protein 6.0 - 8.5 g/dL -  Total Bilirubin 0.0 - 1.2 mg/dL -  Alkaline Phos 39 - 161117 IU/L -  AST 0 - 40 IU/L -  ALT 0 - 32 IU/L -    Discharge instruction: per After Visit Summary and "Baby and Me Booklet".  After visit meds:  Allergies as of 10/27/2018   No Known Allergies     Medication List    STOP taking these medications   Accu-Chek FastClix Lancets Misc   aspirin EC 81 MG tablet   famotidine 20 MG tablet Commonly known as:  PEPCID   glucose blood test strip Commonly known as:  Accu-Chek Guide   metFORMIN 1000 MG tablet Commonly known as:  Glucophage     TAKE these medications   ibuprofen 600 MG tablet Commonly known as:  ADVIL,MOTRIN Take 1 tablet (600 mg total) by mouth every 6 (six) hours.   Prenatal Vitamins 0.8 MG tablet Take 1 tablet by mouth daily.       Diet: carb modified diet  Activity: Advance as tolerated. Pelvic rest for 6 weeks.   Outpatient follow up:4 weeks Follow up Appt:No future appointments. Follow up Visit:No follow-ups on file.   Please schedule this  patient for Postpartum visit in: 4 weeks with the following provider: Any provider For C/S patients schedule nurse incision check in weeks 2 weeks: no High risk pregnancy complicated by: A2GDM, h/o CS Delivery mode:  VBAC Anticipated Birth Control:  OCPs (interval tubal?)  PP Procedures needed: 2 hour GTT  Schedule Integrated BH visit: no   Postpartum contraception: Combination OCPs  Newborn Data: Live born female  Birth Weight:   APGAR: 9, 9  Newborn Delivery   Birth date/time:  10/25/2018 17:54:00 Delivery type:  VBAC, Spontaneous     Baby Feeding: Bottle and Breast Disposition:home with mother   10/27/2018 Wynelle BourgeoisMarie Tyna Huertas, CNM

## 2018-10-25 NOTE — Progress Notes (Signed)
[place] interpreter at bedside from beginning of induction until transfer to mother baby unit.

## 2018-10-26 LAB — CBC
HCT: 31.1 % — ABNORMAL LOW (ref 36.0–46.0)
Hemoglobin: 10.1 g/dL — ABNORMAL LOW (ref 12.0–15.0)
MCH: 23.9 pg — ABNORMAL LOW (ref 26.0–34.0)
MCHC: 32.5 g/dL (ref 30.0–36.0)
MCV: 73.5 fL — ABNORMAL LOW (ref 80.0–100.0)
Platelets: 149 10*3/uL — ABNORMAL LOW (ref 150–400)
RBC: 4.23 MIL/uL (ref 3.87–5.11)
RDW: 14.4 % (ref 11.5–15.5)
WBC: 9.4 10*3/uL (ref 4.0–10.5)
nRBC: 0 % (ref 0.0–0.2)

## 2018-10-26 MED ORDER — MEDROXYPROGESTERONE ACETATE 150 MG/ML IM SUSP
150.0000 mg | Freq: Once | INTRAMUSCULAR | Status: AC
  Administered 2018-10-27: 150 mg via INTRAMUSCULAR
  Filled 2018-10-26: qty 1

## 2018-10-26 MED ORDER — BISACODYL 10 MG RE SUPP: 10.0000 mg | Freq: Every day | RECTAL | Status: DC | PRN

## 2018-10-26 NOTE — Progress Notes (Signed)
Post Partum Day 1 Subjective: up ad lib, voiding, tolerating PO, + flatus and reports rectal pain like she has to have a BM, but cannot. Requests a suppository.   Denies dizziness. Small amount of bleeding today.  Estanislado Spire interpreter used.   Objective: Blood pressure 103/77, pulse 74, temperature 98.5 F (36.9 C), temperature source Oral, resp. rate 18, last menstrual period 01/25/2018, unknown if currently breastfeeding.  Physical Exam:  General: alert, cooperative, appears stated age and no pallor.  Lochia: appropriate Uterine Fundus: firm Incision: NA DVT Evaluation: No evidence of DVT seen on physical exam.  Recent Labs    10/25/18 0705 10/26/18 0854  HGB 12.3 10.1*  HCT 37.2 31.1*    Assessment/Plan: Plan for discharge tomorrow, Breastfeeding, Lactation consult and Contraception IP Depo, then BTL (Cannot do IP today due to restrictions on non-essential surgeries from COVID-19).    Suppository ordered.    LOS: 1 day   [place] 10/26/2018, 11:26 AM

## 2018-10-26 NOTE — Lactation Note (Signed)
This note was copied from a baby's chart. Lactation Consultation Note  Patient Name: Sabrina Daniels Today's Date: 10/26/2018   P1, Baby 18 hours old.  [place] interpreter named "Jamelle Haring" used via phone. Mother is breastfeeding and formula feeding. Encouraged breastfeeding before offering formula which mother states she is doing. Reviewed frequency and engorgement care.  Mother states baby bf on both breasts and mother denies concerns or questions.  Mother will call if LC is further needed.       Maternal Data    Feeding Feeding Type: Breast Fed(right breast)  LATCH Score Latch: Grasps breast easily, tongue down, lips flanged, rhythmical sucking.  Audible Swallowing: A few with stimulation  Type of Nipple: Everted at rest and after stimulation  Comfort (Breast/Nipple): Soft / non-tender  Hold (Positioning): Assistance needed to correctly position infant at breast and maintain latch.  LATCH Score: 8  Interventions    Lactation Tools Discussed/Used     Consult Status      Dahlia Byes [facility] 10/26/2018, 12:39 PM

## 2018-10-26 NOTE — Progress Notes (Signed)
Pts band would not scan on Glucose Machine. CBG result 153. Faxed POCT form at [phone] 10/26/2018. Nurse notified. 

## 2018-10-27 MED ORDER — IBUPROFEN 600 MG PO TABS: 600.0000 mg | ORAL_TABLET | Freq: Four times a day (QID) | ORAL | 0 refills | Status: DC

## 2018-10-27 NOTE — Lactation Note (Signed)
This note was copied from a baby's chart. Lactation Consultation Note  Patient Name: Sabrina Daniels Today's Date: 10/27/2018 Reason for consult: Follow-up assessment   Phone interpreter used for [place].  8.1% weight loss.  Voids/stools WNL. Mother has positional stripe on L breast, slight abrasion on R breast. Mother is applying ebm.  Provided her with coconut oil also. Mother is breastfeeding and giving formula after. Observed breastfeeding encouraging mother to maintain depth. Provided pillow under baby to bring her to nipple height.  Noted mother holding breast tissue back from infant's nose. Provided education on how infant's breath during feeding. Feed on demand approximately 8-12 times per day.       Maternal Data    Feeding Feeding Type: Breast Fed  LATCH Score Latch: Grasps breast easily, tongue down, lips flanged, rhythmical sucking.  Audible Swallowing: A few with stimulation  Type of Nipple: Everted at rest and after stimulation  Comfort (Breast/Nipple): Filling, red/small blisters or bruises, mild/mod discomfort  Hold (Positioning): No assistance needed to correctly position infant at breast.  LATCH Score: 8  Interventions Interventions: Coconut oil;Assisted with latch  Lactation Tools Discussed/Used     Consult Status Consult Status: Complete Date: 10/27/18    Dahlia Byes [facility] 10/27/2018, 10:06 AM

## 2018-10-27 NOTE — Discharge Instructions (Signed)
Vaginal Delivery, Care After °Refer to this sheet in the next few weeks. These instructions provide you with information about caring for yourself after vaginal delivery. Your health care provider may also give you more specific instructions. Your treatment has been planned according to current medical practices, but problems sometimes occur. Call your health care provider if you have any problems or questions. °What can I expect after the procedure? °After vaginal delivery, it is common to have: °· Some bleeding from your vagina. °· Soreness in your abdomen, your vagina, and the area of skin between your vaginal opening and your anus (perineum). °· Pelvic cramps. °· Fatigue. °Follow these instructions at home: °Medicines °· Take over-the-counter and prescription medicines only as told by your health care provider. °· If you were prescribed an antibiotic medicine, take it as told by your health care provider. Do not stop taking the antibiotic until it is finished. °Driving ° °· Do not drive or operate heavy machinery while taking prescription pain medicine. °· Do not drive for 24 hours if you received a sedative. °Lifestyle °· Do not drink alcohol. This is especially important if you are breastfeeding or taking medicine to relieve pain. °· Do not use tobacco products, including cigarettes, chewing tobacco, or e-cigarettes. If you need help quitting, ask your health care provider. °Eating and drinking °· Drink at least 8 eight-ounce glasses of water every day unless you are told not to by your health care provider. If you choose to breastfeed your baby, you may need to drink more water than this. °· Eat high-fiber foods every day. These foods may help prevent or relieve constipation. High-fiber foods include: °? Whole grain cereals and breads. °? Brown rice. °? Beans. °? Fresh fruits and vegetables. °Activity °· Return to your normal activities as told by your health care provider. Ask your health care provider what  activities are safe for you. °· Rest as much as possible. Try to rest or take a nap when your baby is sleeping. °· Do not lift anything that is heavier than your baby or 10 lb (4.5 kg) until your health care provider says that it is safe. °· Talk with your health care provider about when you can engage in sexual activity. This may depend on your: °? Risk of infection. °? Rate of healing. °? Comfort and desire to engage in sexual activity. °Vaginal Care °· If you have an episiotomy or a vaginal tear, check the area every day for signs of infection. Check for: °? More redness, swelling, or pain. °? More fluid or blood. °? Warmth. °? Pus or a bad smell. °· Do not use tampons or douches until your health care provider says this is safe. °· Watch for any blood clots that may pass from your vagina. These may look like clumps of dark red, brown, or black discharge. °General instructions °· Keep your perineum clean and dry as told by your health care provider. °· Wear loose, comfortable clothing. °· Wipe from front to back when you use the toilet. °· Ask your health care provider if you can shower or take a bath. If you had an episiotomy or a perineal tear during labor and delivery, your health care provider may tell you not to take baths for a certain length of time. °· Wear a bra that supports your breasts and fits you well. °· If possible, have someone help you with household activities and help care for your baby for at least a few days after you   leave the hospital. °· Keep all follow-up visits for you and your baby as told by your health care provider. This is important. °Contact a health care provider if: °· You have: °? Vaginal discharge that has a bad smell. °? Difficulty urinating. °? Pain when urinating. °? A sudden increase or decrease in the frequency of your bowel movements. °? More redness, swelling, or pain around your episiotomy or vaginal tear. °? More fluid or blood coming from your episiotomy or vaginal  tear. °? Pus or a bad smell coming from your episiotomy or vaginal tear. °? A fever. °? A rash. °? Little or no interest in activities you used to enjoy. °? Questions about caring for yourself or your baby. °· Your episiotomy or vaginal tear feels warm to the touch. °· Your episiotomy or vaginal tear is separating or does not appear to be healing. °· Your breasts are painful, hard, or turn red. °· You feel unusually sad or worried. °· You feel nauseous or you vomit. °· You pass large blood clots from your vagina. If you pass a blood clot from your vagina, save it to show to your health care provider. Do not flush blood clots down the toilet without having your health care provider look at them. °· You urinate more than usual. °· You are dizzy or light-headed. °· You have not breastfed at all and you have not had a menstrual period for 12 weeks after delivery. °· You have stopped breastfeeding and you have not had a menstrual period for 12 weeks after you stopped breastfeeding. °Get help right away if: °· You have: °? Pain that does not go away or does not get better with medicine. °? Chest pain. °? Difficulty breathing. °? Blurred vision or spots in your vision. °? Thoughts about hurting yourself or your baby. °· You develop pain in your abdomen or in one of your legs. °· You develop a severe headache. °· You faint. °· You bleed from your vagina so much that you fill two sanitary pads in one hour. °This information is not intended to replace advice given to you by your health care provider. Make sure you discuss any questions you have with your health care provider. °Document Released: 06/27/2000 Document Revised: 12/12/2015 Document Reviewed: 07/15/2015 °Elsevier Interactive Patient Education © 2019 Elsevier Inc. ° °

## 2018-11-03 ENCOUNTER — Other Ambulatory Visit: Payer: Self-pay | Admitting: Advanced Practice Midwife

## 2018-11-04 ENCOUNTER — Telehealth: Payer: Self-pay | Admitting: Obstetrics & Gynecology

## 2018-11-04 NOTE — Telephone Encounter (Signed)
Called the patient with an interrupter to communicate the new appointment. Called the intuperruter line twice however there was no one available to speak the language. The language line hung up. Called the patient twice attempting to communicate the appointment. The patient does not understand the appointment is virtual and she does not need to come in. Sending a letter with the appointment information.

## 2018-11-23 ENCOUNTER — Ambulatory Visit (INDEPENDENT_AMBULATORY_CARE_PROVIDER_SITE_OTHER): Payer: Medicaid Other

## 2018-11-23 MED ORDER — IBUPROFEN 600 MG PO TABS: 600.0000 mg | ORAL_TABLET | Freq: Four times a day (QID) | ORAL | 1 refills | Status: DC

## 2018-11-23 NOTE — Progress Notes (Signed)
TELEHEALTH VIRTUAL POSTPARTUM VISIT ENCOUNTER NOTE  I connected with Sabrina Daniels on 11/23/18 at  1:55 PM EDT by telephone at home and verified that I am speaking with the correct person using two identifiers.   I discussed the limitations, risks, security and privacy concerns of performing an evaluation and management service by telephone and the availability of in person appointments. I also discussed with the patient that there may be a patient responsible charge related to this service. The patient expressed understanding and agreed to proceed.  Appointment Date: 11/23/2018  [facility]  Chief Complaint:  Chief Complaint  Patient presents with  . Postpartum Care    History of Present Illness: Sabrina Daniels is a 38 y.o. Asian [id number] (No LMP recorded.), seen for the above chief complaint. Her past medical history is significant for previous cesarean section, sickle cell trait.   She is s/p normal spontaneous vaginal delivery on 4/13 at 39 weeks; she was discharged to home on PPD#2. Pregnancy complicated by A2GDM. Baby is doing well.  Complains of hemorrhoids  Vaginal bleeding or discharge: No  Mode of feeding infant: both Intercourse: No  Contraception: Depo-Provera PP depression s/s: No .  Any bowel or bladder issues: No  Pap smear: no abnormalities (date: 05/2018)  Review of Systems: Positive for hemorrhoids. Her 12 point review of systems is negative or as noted in the History of Present Illness.  Patient Active Problem List   Diagnosis Date Noted  . Gestational diabetes 10/25/2018  . Gestational diabetes mellitus (GDM) affecting pregnancy, antepartum 08/13/2018  . History of preterm delivery, currently pregnant 08/13/2018  . Grand multipara 06/17/2018  . GBS bacteriuria 05/27/2018  . Language barrier 06/30/2016  . AMA (advanced maternal age) multigravida 35+ 06/30/2016  . History of VBAC 04/08/2013  . Supervision of high-risk pregnancy 05/29/2011  . Sickle cell  trait (HCC) 04/29/2011  . History of HELLP syndrome 07/15/2007    Medications Danille Antilla had no medications administered during this visit. Current Outpatient Medications  Medication Sig Dispense Refill  . ibuprofen (ADVIL) 600 MG tablet TAKE 1 TABLET BY MOUTH EVERY 6 HOURS (Patient not taking: Reported on 11/23/2018) 30 tablet 0  . Prenatal Multivit-Min-Fe-FA (PRENATAL VITAMINS) 0.8 MG tablet Take 1 tablet by mouth daily. (Patient not taking: Reported on 11/23/2018) 30 tablet 12   No current facility-administered medications for this visit.     Allergies Patient has no known allergies.  Physical Exam:  General:  Alert, oriented and cooperative.   Mental Status: Normal mood and affect perceived. Normal judgment and thought content.  Rest of physical exam deferred due to type of encounter  PP Depression Screening:   Edinburgh Postnatal Depression Scale - 11/23/18 1426      Edinburgh Postnatal Depression Scale:  In the Past 7 Days   I have been able to laugh and see the funny side of things.  0    I have looked forward with enjoyment to things.  0    I have blamed myself unnecessarily when things went wrong.  0    I have been anxious or worried for no good reason.  0    I have felt scared or panicky for no good reason.  0    Things have been getting on top of me.  0    I have been so unhappy that I have had difficulty sleeping.  0    I have felt sad or miserable.  0    I have been  so unhappy that I have been crying.  0    The thought of harming myself has occurred to me.  0    Edinburgh Postnatal Depression Scale Total  0       Assessment:Patient is a 38 y.o. [name] who is 4 weeks postpartum from a normal spontaneous vaginal delivery.  She is doing well.   Plan:  1. Postpartum care and examination -Discussed comfort measures for hemorrhoids -Patient to come for 2 hour GTT as soon as possible for lab only appointment -Patient planning to continue to use Depo for contraception    RTC 1 year for annual exam or sooner if needed  I discussed the assessment and treatment plan with the patient. The patient was provided an opportunity to ask questions and all were answered. The patient agreed with the plan and demonstrated an understanding of the instructions.   The patient was advised to call back or seek an in-person evaluation/go to the ED for any concerning postpartum symptoms.  I provided 25 minutes of non-face-to-face time during this encounter.   Rolm Bookbinder, CNM [facility] [employer], [facility]

## 2018-12-02 ENCOUNTER — Other Ambulatory Visit: Payer: Self-pay | Admitting: General Practice

## 2018-12-02 ENCOUNTER — Other Ambulatory Visit: Payer: Medicaid Other

## 2018-12-02 DIAGNOSIS — O24419 Gestational diabetes mellitus in pregnancy, unspecified control: Secondary | ICD-10-CM

## 2018-12-08 ENCOUNTER — Other Ambulatory Visit: Payer: Self-pay

## 2018-12-08 ENCOUNTER — Other Ambulatory Visit: Payer: Medicaid Other

## 2018-12-08 DIAGNOSIS — O24419 Gestational diabetes mellitus in pregnancy, unspecified control: Secondary | ICD-10-CM

## 2018-12-08 LAB — POCT URINALYSIS DIP (DEVICE)
Bilirubin Urine: NEGATIVE
Glucose, UA: NEGATIVE mg/dL
Hgb urine dipstick: NEGATIVE
Ketones, ur: NEGATIVE mg/dL
Leukocytes,Ua: NEGATIVE
Nitrite: NEGATIVE
Protein, ur: NEGATIVE mg/dL
Specific Gravity, Urine: 1.015 (ref 1.005–1.030)
Urobilinogen, UA: 0.2 mg/dL (ref 0.0–1.0)
pH: 5.5 (ref 5.0–8.0)

## 2018-12-08 NOTE — Progress Notes (Unsigned)
Patient presents to office today for 2 hr gtt. Patient reports dysuria over the past week and has recently been treated for a UTI. Patient states she is worried she has another. UA performed- test negative. Discussed with patient she may still be healing from delivery and told her to call us if it continues. Patient verbalized understanding and states she is on the depo shot and has started to spot recently. Discussed with patient that can be normal with the first several injections. Patient verbalized understanding & asked if she could get pregnant because of the bleeding. Told patient no the injection is still providing contraception. Patient verbalized understanding. Patient reports fever/chills in her breast at nighttime and reports lumps in both armpits. Sharon Hice in to see patient. Elenor Legato used for interpreter over the phone.  Chase Caller RN BSN 12/08/18

## 2018-12-08 NOTE — Progress Notes (Unsigned)
Spoke with pt using [employer] via phone while here for nurse visit. Pt has concerns she has some fullness to her breasts that causes them to be warm and then causes her to have chills only at night. She reports she is BF infant every other feeding at night and giving formula otherwise. Reviewed supply and demand and getting engorged at night due to inadequate emptying of the breast at night. Pt reports hand expression helps. Pt reports she has lumps to her axilla that has been there since infant born.   Enc pt to empty the breasts more consistenly. Enc pt to apply warm compresses to the area and empty the breast using massage with each feeding. Advised pt that if lumps to axilla do not resolve in the next few weeks with warm compresses, massage and breast emptying, that she should let the office know to see if she needs to be assessed further.   Pt was given manual pump and shown how to assemble, disassemble,  and clean. Enc pt to pump her breasts if infant is not latching to the breast to protect milk supply and prevent issues. Enc pt to feed all pumped milk to infant when available. Discussed milk storage for breast milk. Pt voiced understanding

## 2018-12-09 LAB — GLUCOSE TOLERANCE, 2 HOURS
Glucose, 2 hour: 76 mg/dL (ref 65–139)
Glucose, GTT - Fasting: 71 mg/dL (ref 65–99)

## 2018-12-15 ENCOUNTER — Other Ambulatory Visit: Payer: Self-pay | Admitting: *Deleted

## 2018-12-15 MED ORDER — FAMOTIDINE 20 MG PO TABS: 20.0000 mg | ORAL_TABLET | Freq: Two times a day (BID) | ORAL | 2 refills | Status: DC

## 2018-12-15 NOTE — Progress Notes (Signed)
Received Fax request from [facility] for refill of Famotidine. Refill approved per Dr. Vergie Living and e-prescribed.

## 2019-01-18 ENCOUNTER — Other Ambulatory Visit: Payer: Self-pay

## 2019-02-08 ENCOUNTER — Ambulatory Visit (INDEPENDENT_AMBULATORY_CARE_PROVIDER_SITE_OTHER): Payer: Medicaid Other

## 2019-02-08 ENCOUNTER — Other Ambulatory Visit: Payer: Self-pay

## 2019-02-08 DIAGNOSIS — Z3042 Encounter for surveillance of injectable contraceptive: Secondary | ICD-10-CM | POA: Diagnosis present

## 2019-02-08 LAB — POCT PREGNANCY, URINE: Preg Test, Ur: NEGATIVE

## 2019-02-08 MED ORDER — MEDROXYPROGESTERONE ACETATE 150 MG/ML IM SUSP
150.0000 mg | Freq: Once | INTRAMUSCULAR | Status: AC
  Administered 2019-02-08: 150 mg via INTRAMUSCULAR

## 2019-02-08 NOTE — Progress Notes (Signed)
Sabrina Daniels here for Depo-Provera  Injection.  Injection administered without complication. Patient will return in 3 months for next injection.  Per chart review pt is late for Depo was last due to be given by July 15th.  Pregnancy test given.  Resulted negative. With interpreter [name], pt reports that she last had intercourse about 2-3 weeks ago.      Verdell Carmine, RN 02/08/2019  1:36 PM

## 2019-04-26 ENCOUNTER — Ambulatory Visit (INDEPENDENT_AMBULATORY_CARE_PROVIDER_SITE_OTHER): Payer: Medicaid Other

## 2019-04-26 ENCOUNTER — Other Ambulatory Visit: Payer: Self-pay

## 2019-04-26 VITALS — Wt 129.7 lb

## 2019-04-26 DIAGNOSIS — Z3042 Encounter for surveillance of injectable contraceptive: Secondary | ICD-10-CM

## 2019-04-26 MED ORDER — MEDROXYPROGESTERONE ACETATE 150 MG/ML IM SUSP
150.0000 mg | Freq: Once | INTRAMUSCULAR | Status: AC
  Administered 2019-04-26: 11:00:00 150 mg via INTRAMUSCULAR

## 2019-04-26 NOTE — Progress Notes (Signed)
Chart reviewed for nurse visit. Agree with plan of care.   Normand Damron [place], CNM 04/26/2019 2:10 PM   

## 2019-04-26 NOTE — Progress Notes (Signed)
Chart reviewed for nurse visit. Agree with plan of care.   Starr [place], CNM 04/26/2019 10:45 AM

## 2019-04-26 NOTE — Progress Notes (Signed)
Sabrina Daniels here for Depo-Provera  Injection.  Injection administered without complication. Patient will return in 3 months for next injection.  [name], [place] 04/26/2019  10:32 AM

## 2019-07-12 ENCOUNTER — Telehealth: Payer: Self-pay | Admitting: Family Medicine

## 2019-07-12 NOTE — Telephone Encounter (Signed)
Attempted to reach patient about changes made in the office. Message was left about change.

## 2019-07-13 ENCOUNTER — Ambulatory Visit: Payer: Medicaid Other

## 2019-07-13 ENCOUNTER — Other Ambulatory Visit: Payer: Self-pay

## 2019-07-13 ENCOUNTER — Ambulatory Visit (INDEPENDENT_AMBULATORY_CARE_PROVIDER_SITE_OTHER): Payer: Medicaid Other | Admitting: General Practice

## 2019-07-13 VITALS — BP 124/86 | HR 66 | Ht 59.0 in | Wt 128.0 lb

## 2019-07-13 DIAGNOSIS — Z23 Encounter for immunization: Secondary | ICD-10-CM | POA: Diagnosis not present

## 2019-07-13 DIAGNOSIS — Z3042 Encounter for surveillance of injectable contraceptive: Secondary | ICD-10-CM | POA: Diagnosis present

## 2019-07-13 MED ORDER — MEDROXYPROGESTERONE ACETATE 150 MG/ML IM SUSP
150.0000 mg | Freq: Once | INTRAMUSCULAR | Status: AC
  Administered 2019-07-13: 150 mg via INTRAMUSCULAR

## 2019-07-13 NOTE — Progress Notes (Signed)
Sofija Petsch here for Depo-Provera  Injection & is also requesting the Flu Vaccine.  Injections administered without complication. Patient will return in 3 months for next depo injection.  Derinda Late, RN 07/13/2019  10:01 AM

## 2019-07-18 NOTE — Progress Notes (Signed)
Patient seen and assessed by nursing staff during this encounter. I have reviewed the chart and agree with the documentation and plan.  Delta Bing, MD 07/18/2019 11:30 AM

## 2019-07-25 ENCOUNTER — Ambulatory Visit: Payer: Medicaid Other

## 2019-09-29 ENCOUNTER — Ambulatory Visit: Payer: Medicaid Other

## 2019-09-29 ENCOUNTER — Other Ambulatory Visit: Payer: Self-pay

## 2019-09-29 ENCOUNTER — Ambulatory Visit (INDEPENDENT_AMBULATORY_CARE_PROVIDER_SITE_OTHER): Payer: Medicaid Other

## 2019-09-29 VITALS — BP 128/80 | HR 90 | Wt 129.1 lb

## 2019-09-29 DIAGNOSIS — Z3042 Encounter for surveillance of injectable contraceptive: Secondary | ICD-10-CM

## 2019-09-29 MED ORDER — MEDROXYPROGESTERONE ACETATE 150 MG/ML IM SUSP
150.0000 mg | Freq: Once | INTRAMUSCULAR | Status: AC
  Administered 2019-09-29: 10:00:00 150 mg via INTRAMUSCULAR

## 2019-09-29 NOTE — Progress Notes (Signed)
Moraima Glodowski here for Depo-Provera  Injection.  Injection administered without complication. Patient will return in 3 months for next injection. Interpreter Lek Sui [place], RN 09/29/2019  9:09 AM

## 2019-12-15 ENCOUNTER — Ambulatory Visit (INDEPENDENT_AMBULATORY_CARE_PROVIDER_SITE_OTHER): Payer: Medicaid Other | Admitting: Lactation Services

## 2019-12-15 ENCOUNTER — Other Ambulatory Visit: Payer: Self-pay

## 2019-12-15 DIAGNOSIS — Z3042 Encounter for surveillance of injectable contraceptive: Secondary | ICD-10-CM | POA: Diagnosis not present

## 2019-12-15 MED ORDER — MEDROXYPROGESTERONE ACETATE 150 MG/ML IM SUSP
150.0000 mg | Freq: Once | INTRAMUSCULAR | Status: AC
  Administered 2019-12-15: 150 mg via INTRAMUSCULAR

## 2019-12-15 NOTE — Patient Instructions (Signed)
Primary Care Physicians  [facility] [phone]   [facility] [phone]

## 2019-12-15 NOTE — Progress Notes (Signed)
Sabrina Daniels here for Depo-Provera  Injection.  Injection administered without complication. Patient will return in 3 months for next injection. Patient tolerated well.   Patient reports she is feeling cold all the time. Reviewed it is time for her annual exam and to schedule when she checks out. She reports she has some numbness to her lower extremities when sleeping occasionally. She reports she rotates positions. She is not able to tell me how often. Gave information on Primary Care Offices. Patient concerned she is self pay. Reviewed speaking with DSS to see if she qualifies for Medicaid and was informed of programs ([facility] and [facility])  that may help with costs and expenses. Patient voiced understanding.   Medical Lenn Cal, Lek present.   Ed Blalock, RN 12/15/2019  9:31 AM

## 2019-12-15 NOTE — Progress Notes (Signed)
Patient seen and assessed by nursing staff.  Agree with documentation and plan.  

## 2020-01-10 IMAGING — US US MFM OB DETAIL+14 WK
1 series · 13 of 28 positions shown · non-contrast
Comparison: none

[Series 1: us mfm ob detail+14 wk · 13 of 107 slices shown]
[im 4/107]
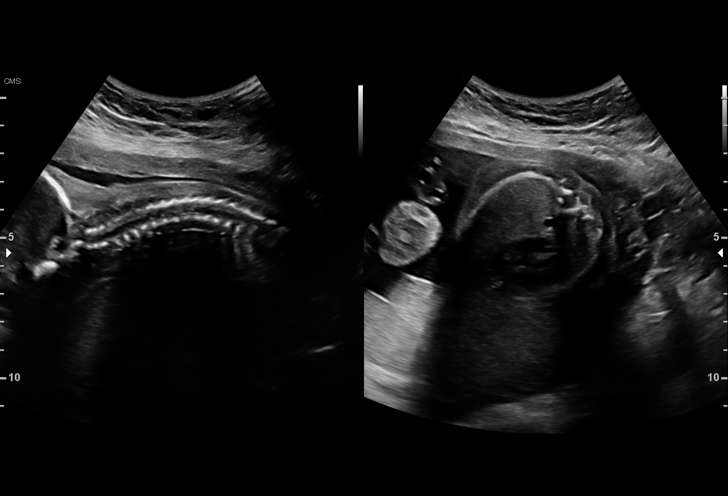
[im 12/107]
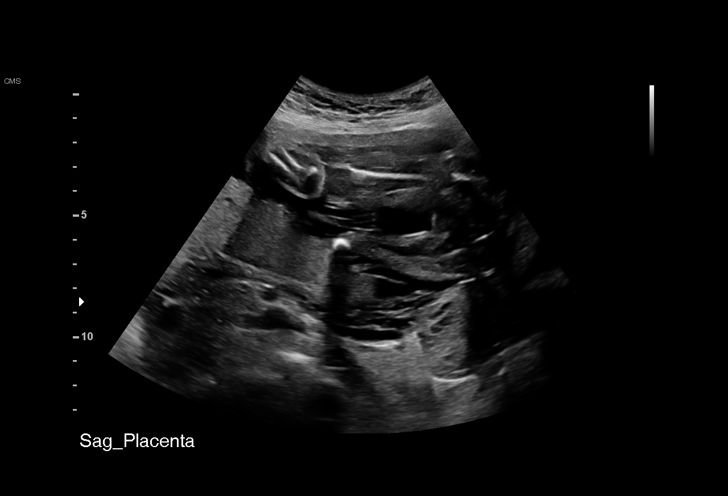
[im 20/107]
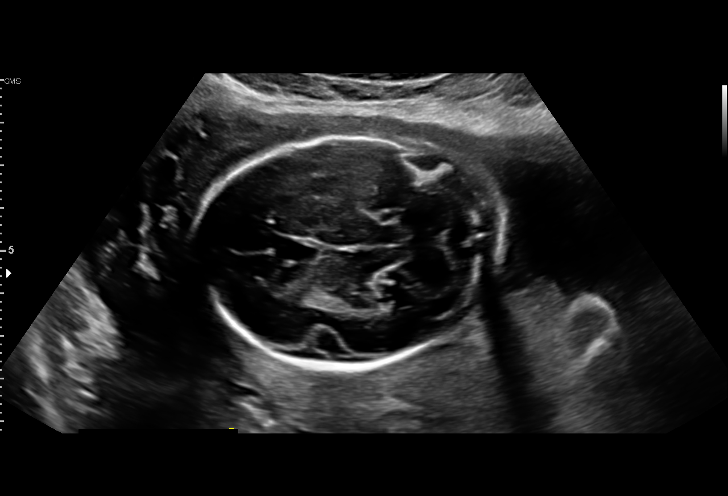
[im 28/107]
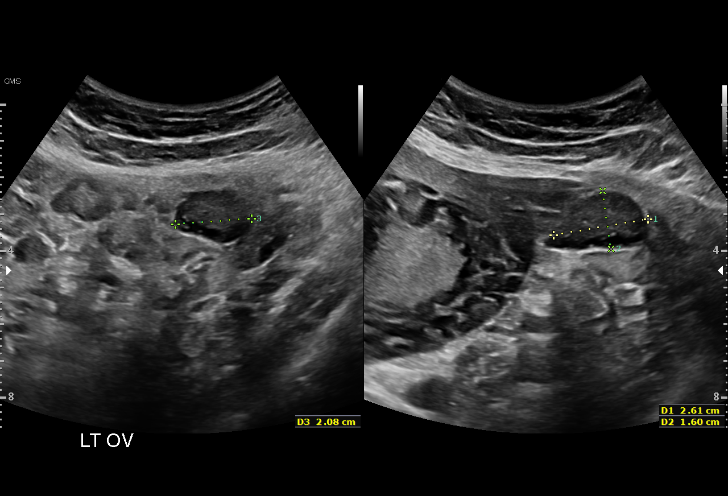
[im 36/107]
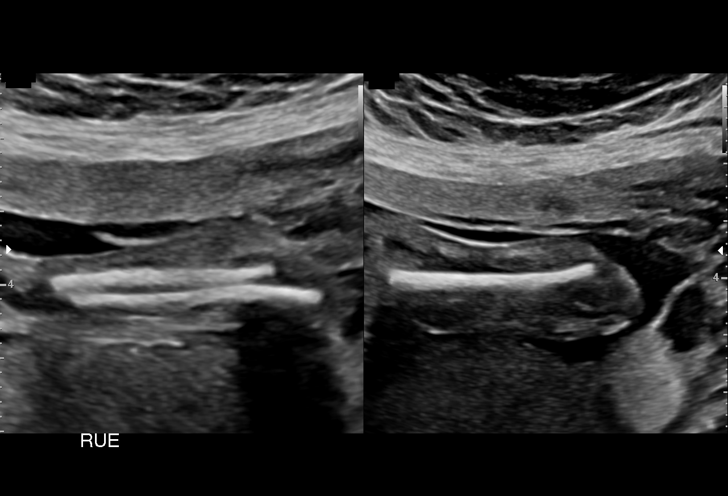
[im 44/107]
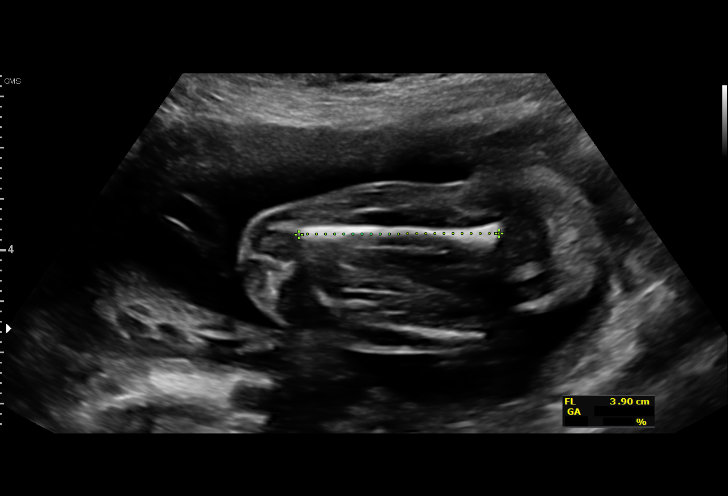
[im 55/107]
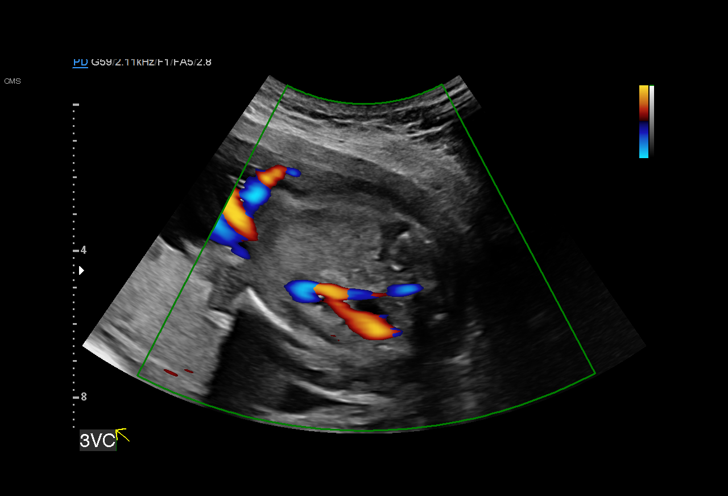
[im 63/107]
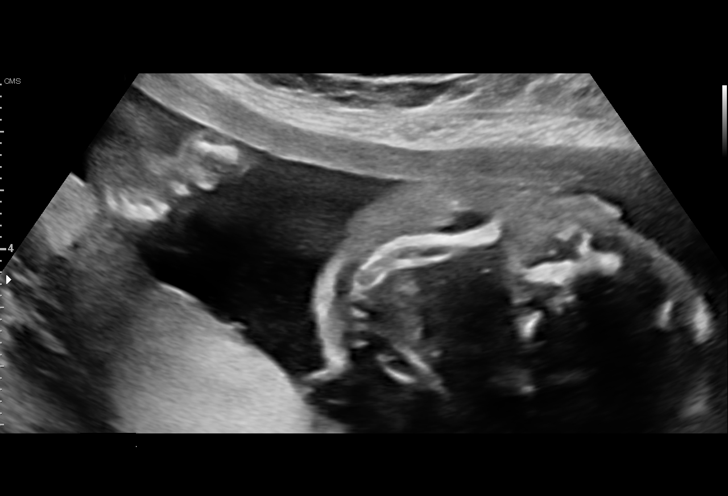
[im 71/107]
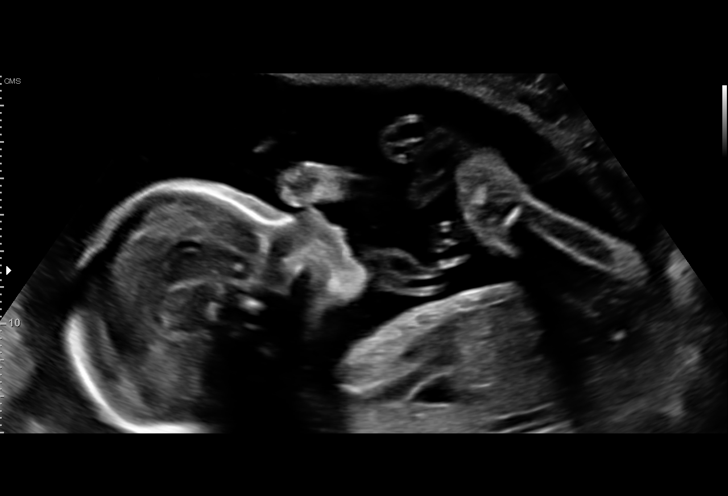
[im 79/107]
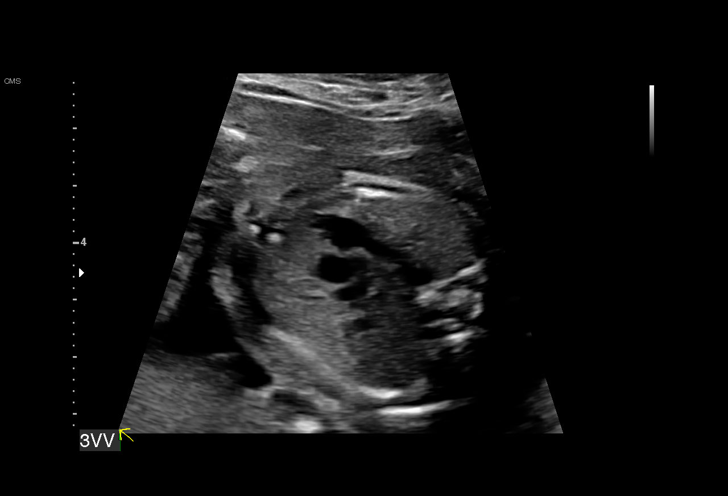
[im 87/107]
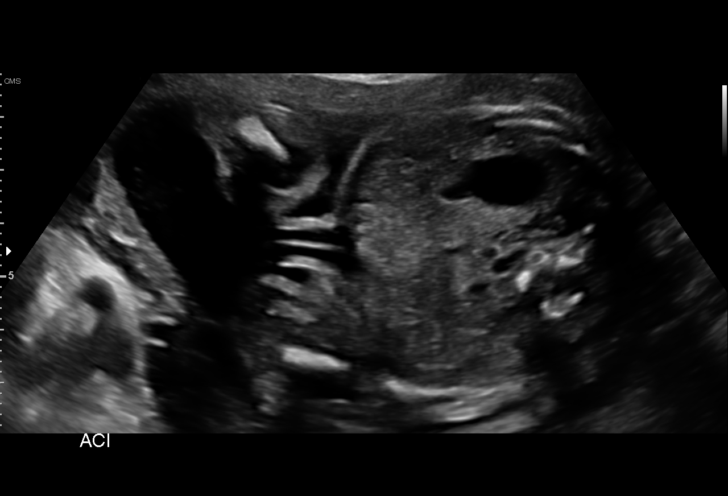
[im 95/107]
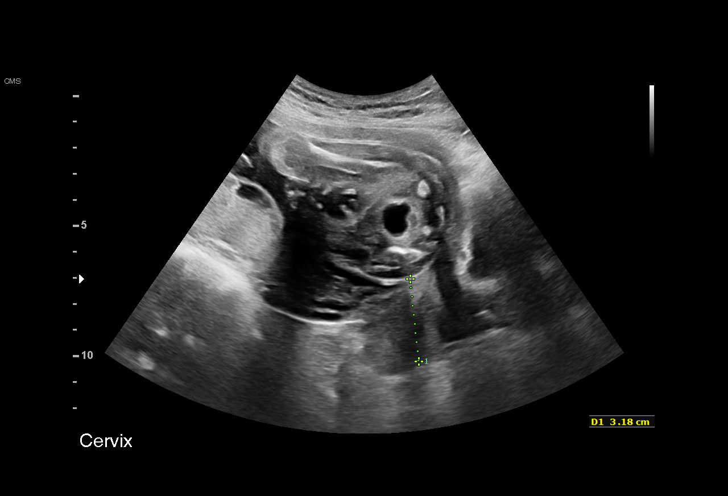
[im 103/107]
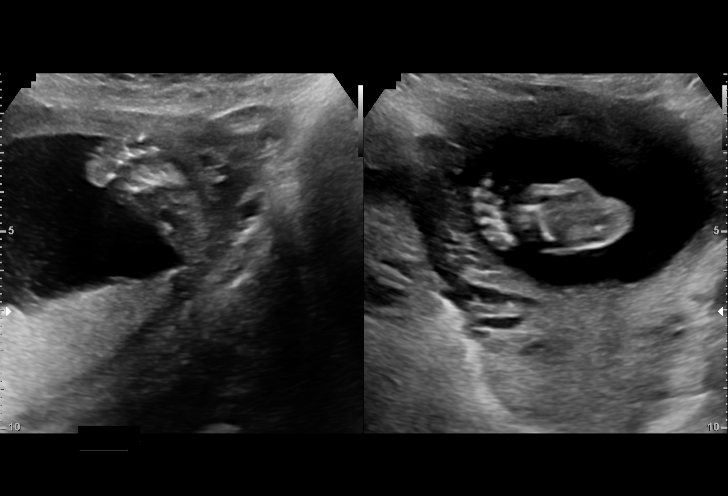

[13 of 28 positions shown; findings below may reference images not displayed]

OB/Gyn Clinic

 ----------------------------------------------------------------------

 ----------------------------------------------------------------------
Indications

  Encounter for antenatal screening for
  malformations
  Advanced maternal age multigravida 35+,
  second trimester (low risk NIPS)
  History of sickle cell trait/Warsito Han-Han trait
  Previous cesarean delivery, antepartum
  Poor obstetric history: Previous preterm
  delivery, antepartum (29 wks)
  Poor obstetric history: Previous (HELLP)
  22 weeks gestation of pregnancy
 ----------------------------------------------------------------------
Fetal Evaluation

 Num Of Fetuses:         1
 Fetal Heart Rate(bpm):  129
 Cardiac Activity:       Observed
 Presentation:           Breech
 Placenta:               Posterior
 P. Cord Insertion:      Visualized

 Amniotic Fluid
 AFI FV:      Within normal limits

                             Largest Pocket(cm)

Biometry
 BPD:      54.3  mm     G. Age:  22w 4d         44  %    CI:        69.45   %    70 - 86
                                                         FL/HC:      18.8   %    19.2 -
 HC:       208   mm     G. Age:  22w 6d         50  %    HC/AC:      1.15        1.05 -
 AC:      180.2  mm     G. Age:  22w 6d         52  %    FL/BPD:     72.0   %    71 - 87
 FL:       39.1  mm     G. Age:  22w 4d         39  %    FL/AC:      21.7   %    20 - 24
 HUM:      36.7  mm     G. Age:  22w 6d         50  %
 CER:      25.4  mm     G. Age:  23w 3d         62  %

 CM:          6  mm

 Est. FW:     531  gm      1 lb 3 oz     54  %
OB History

 Gravidity:    7         Term:   4        Prem:   1        SAB:   1
 TOP:          0       Ectopic:  0        Living: 5
Gestational Age

 LMP:           22w 4d        Date:  01/25/18                 EDD:   11/01/18
 U/S Today:     22w 5d                                        EDD:   10/31/18
 Best:          22w 4d     Det. By:  LMP  (01/25/18)          EDD:   11/01/18
Anatomy

 Cranium:               Appears normal         LVOT:                   Appears normal
 Cavum:                 Appears normal         Aortic Arch:            Appears normal
 Ventricles:            Appears normal         Ductal Arch:            Appears normal
 Choroid Plexus:        Appears normal         Diaphragm:              Appears normal
 Cerebellum:            Appears normal         Stomach:                Appears normal, left
                                                                       sided
 Posterior Fossa:       Appears normal         Abdomen:                Appears normal
 Nuchal Fold:           Not applicable (>20    Abdominal Wall:         Appears nml (cord
                        wks GA)                                        insert, abd wall)
 Face:                  Appears normal         Cord Vessels:           Appears normal (3
                        (orbits and profile)                           vessel cord)
 Lips:                  Appears normal         Kidneys:                Appear normal
 Palate:                Appears normal         Bladder:                Appears normal
 Thoracic:              Appears normal         Spine:                  Appears normal
 Heart:                 Appears normal         Upper Extremities:      Appears normal
                        (4CH, axis, and
                        situs)
 RVOT:                  Appears normal         Lower Extremities:      Appears normal

 Other:  Heels and right 5th digit/open hand visualized. Nasal bone visualized.
Cervix Uterus Adnexa

 Cervix
 Length:              3  cm.
 Normal appearance by transabdominal scan.

 Uterus
 No abnormality visualized.

 Left Ovary
 Within normal limits.

 Right Ovary
 Within normal limits.
 Cul De Sac
 No free fluid seen.

 Adnexa
 No abnormality visualized.
Comments

 U/S images reviewed.  Appropriate fetal growth is seen.  No
 fetal abnormalities are identified.
 Recommendations: 1) Genetic counseling 2) Serial U/S every
 4 weeks for fetal growth 3) Weekly BPP beginning @ 36
 weeks
Recommendations

 1) Genetic counseling 2) Serial U/S every 4 weeks for fetal
 growth 3) Weekly BPP beginning @ 36 weeks
              Sabas, [place]

## 2020-01-24 ENCOUNTER — Encounter: Payer: Self-pay | Admitting: Medical

## 2020-01-24 ENCOUNTER — Ambulatory Visit (INDEPENDENT_AMBULATORY_CARE_PROVIDER_SITE_OTHER): Payer: Medicaid Other | Admitting: Medical

## 2020-01-24 ENCOUNTER — Other Ambulatory Visit: Payer: Self-pay

## 2020-01-24 ENCOUNTER — Other Ambulatory Visit (HOSPITAL_COMMUNITY)
Admission: RE | Admit: 2020-01-24 | Discharge: 2020-01-24 | Disposition: A | Discharge status: Home / Self Care | Payer: Medicaid Other | Source: Ambulatory Visit | Attending: Medical | Admitting: Medical

## 2020-01-24 VITALS — BP 119/81 | HR 71 | Ht 60.0 in | Wt 126.0 lb

## 2020-01-24 DIAGNOSIS — Z01419 Encounter for gynecological examination (general) (routine) without abnormal findings: Secondary | ICD-10-CM

## 2020-01-24 DIAGNOSIS — R6889 Other general symptoms and signs: Secondary | ICD-10-CM

## 2020-01-24 DIAGNOSIS — R6882 Decreased libido: Secondary | ICD-10-CM

## 2020-01-24 DIAGNOSIS — N898 Other specified noninflammatory disorders of vagina: Secondary | ICD-10-CM

## 2020-01-24 NOTE — Progress Notes (Signed)
History:  [name] is a 39 y.o. [id number] who presents to clinic today for annual exam with pap smear. The patient is on Depo provera for birth control. She is concerned this may be causing her vaginal dryness and decreased sexual desire. Last depo given 12/15/19. She does not have periods on the Depo Provera. Last pap smear 05/2018 was normal. She denies a history of abnormal pap smear. She also complains of feeling cold all the time.    The following portions of the patient's history were reviewed and updated as appropriate: allergies, current medications, family history, past medical history, social history, past surgical history and problem list.  Review of Systems:  Review of Systems  Constitutional: Negative for fever and malaise/fatigue.  Gastrointestinal: Negative for abdominal pain, constipation, diarrhea, nausea and vomiting.  Genitourinary: Negative for dysuria, frequency and urgency.       Neg - vaginal bleeding, discharge  Endo/Heme/Allergies:       + cold intolerance      Objective:  Physical Exam BP 119/81   Pulse 71   Ht 5' (1.524 m)   Wt 126 lb (57.2 kg)   Breastfeeding Yes   BMI 24.61 kg/m  Physical Exam Vitals and nursing note reviewed. Exam conducted with a chaperone present.  Constitutional:      General: She is not in acute distress.    Appearance: She is well-developed and normal weight.  HENT:     Head: Normocephalic and atraumatic.  Eyes:     Extraocular Movements: Extraocular movements intact.     Conjunctiva/sclera: Conjunctivae normal.  Neck:     Thyroid: No thyromegaly.  Cardiovascular:     Rate and Rhythm: Normal rate and regular rhythm.     Heart sounds: No murmur heard.   Pulmonary:     Effort: Pulmonary effort is normal. No respiratory distress.     Breath sounds: Normal breath sounds. No wheezing.  Abdominal:     General: Abdomen is flat. Bowel sounds are normal. There is no distension.     Palpations: Abdomen is soft. There is no  mass.     Tenderness: There is no abdominal tenderness. There is no guarding or rebound.  Genitourinary:    General: Normal vulva.     Vagina: Injury: scant, white. Vaginal discharge present. No bleeding.     Cervix: No cervical motion tenderness, discharge or friability.     Uterus: Not enlarged and not tender.      Adnexa:        Right: No mass or tenderness.         Left: No mass or tenderness.    Musculoskeletal:     Cervical back: Neck supple.  Skin:    General: Skin is warm and dry.     Findings: No erythema.  Neurological:     Mental Status: She is alert and oriented to person, place, and time.  Psychiatric:        Mood and Affect: Mood normal.     Assessment & Plan:  1. Well woman exam with routine gynecological exam - Cytology - PAP( [facility])  2. Cold intolerance - CBC - TSH - T4, free  3. Vaginal dryness and low sex drive - Patient counseled on other options for birth control and how we could trial off of Depo provera to see if this improves, she will consider prior to next Depo injection - Encouraged use of water-based lubricant in the meantime for vaginal dryness  Patient will be contacted with results Approximately 25 minutes of total time was spent with this patient. More than 50% was spent on counseling. There was an interpreter present for the entire visit.   Marny Lowenstein, PA-C 01/24/2020 11:22 AM

## 2020-01-24 NOTE — Patient Instructions (Addendum)

## 2020-01-25 LAB — CBC
Hematocrit: 45.7 % (ref 34.0–46.6)
Hemoglobin: 14.3 g/dL (ref 11.1–15.9)
MCH: 23 pg — ABNORMAL LOW (ref 26.6–33.0)
MCHC: 31.3 g/dL — ABNORMAL LOW (ref 31.5–35.7)
MCV: 74 fL — ABNORMAL LOW (ref 79–97)
Platelets: 284 10*3/uL (ref 150–450)
RBC: 6.21 x10E6/uL — ABNORMAL HIGH (ref 3.77–5.28)
RDW: 15.5 % — ABNORMAL HIGH (ref 11.7–15.4)
WBC: 5.8 10*3/uL (ref 3.4–10.8)

## 2020-01-25 LAB — TSH: TSH: 0.781 u[IU]/mL (ref 0.450–4.500)

## 2020-01-25 LAB — T4, FREE: Free T4: 1.08 ng/dL (ref 0.82–1.77)

## 2020-01-27 ENCOUNTER — Other Ambulatory Visit: Payer: Self-pay | Admitting: Medical

## 2020-01-27 DIAGNOSIS — R6889 Other general symptoms and signs: Secondary | ICD-10-CM

## 2020-01-27 LAB — CYTOLOGY - PAP
Comment: NEGATIVE
Diagnosis: NEGATIVE
High risk HPV: NEGATIVE

## 2020-01-27 NOTE — Progress Notes (Signed)
I called [name] with results from her recent visiting using an interpreter today. I confirmed her identity using 2 identifiers. The patient was informed of normal CBC, TSH, T4 and pap smear today. She will be referred to [facility] medicine for further work up of cold intolerance at this time. Referral placed in Epic. Patient advised that next pap is due 2024. She voiced understanding. All questions answered.   Vonzella Nipple, PA-C 01/27/2020 10:57 AM

## 2020-03-01 ENCOUNTER — Other Ambulatory Visit: Payer: Self-pay

## 2020-03-01 ENCOUNTER — Ambulatory Visit (INDEPENDENT_AMBULATORY_CARE_PROVIDER_SITE_OTHER): Payer: Medicaid Other | Admitting: *Deleted

## 2020-03-01 VITALS — BP 130/89 | HR 67 | Ht 59.5 in | Wt 128.2 lb

## 2020-03-01 DIAGNOSIS — Z3042 Encounter for surveillance of injectable contraceptive: Secondary | ICD-10-CM

## 2020-03-01 MED ORDER — MEDROXYPROGESTERONE ACETATE 150 MG/ML IM SUSP
150.0000 mg | Freq: Once | INTRAMUSCULAR | Status: AC
  Administered 2020-03-01: 150 mg via INTRAMUSCULAR

## 2020-03-01 NOTE — Progress Notes (Signed)
Sabrina Daniels here for Depo-Provera  Injection. Last Depo 12/15/19. Last yearly exam 01/24/20.   Injection administered without complication. Patient will return in 3 months for next injection.  Gianella Chismar,RN 03/01/2020  9:48 AM

## 2020-05-17 ENCOUNTER — Other Ambulatory Visit: Payer: Self-pay

## 2020-05-17 ENCOUNTER — Ambulatory Visit (INDEPENDENT_AMBULATORY_CARE_PROVIDER_SITE_OTHER): Payer: Medicaid Other | Admitting: General Practice

## 2020-05-17 VITALS — BP 134/80 | HR 63 | Ht 59.0 in | Wt 130.0 lb

## 2020-05-17 DIAGNOSIS — Z3042 Encounter for surveillance of injectable contraceptive: Secondary | ICD-10-CM | POA: Diagnosis not present

## 2020-05-17 DIAGNOSIS — Z23 Encounter for immunization: Secondary | ICD-10-CM | POA: Diagnosis not present

## 2020-05-17 MED ORDER — MEDROXYPROGESTERONE ACETATE 150 MG/ML IM SUSP
150.0000 mg | Freq: Once | INTRAMUSCULAR | Status: AC
  Administered 2020-05-17: 150 mg via INTRAMUSCULAR

## 2020-05-17 NOTE — Progress Notes (Signed)
Sabrina Daniels here for Depo-Provera Injection. Injection administered without complication. Patient requested flu vaccine given. Flu vaccine given. Patient will return in 3 months for next injection between January 20 and February 3. Next annual visit due July 2022.   Marylynn Pearson, RN 05/17/2020  9:34 AM

## 2020-08-02 ENCOUNTER — Other Ambulatory Visit: Payer: Self-pay

## 2020-08-02 ENCOUNTER — Encounter: Payer: Self-pay | Admitting: *Deleted

## 2020-08-02 ENCOUNTER — Ambulatory Visit (INDEPENDENT_AMBULATORY_CARE_PROVIDER_SITE_OTHER): Payer: Medicaid Other | Admitting: *Deleted

## 2020-08-02 VITALS — BP 121/83 | HR 70 | Ht 59.0 in | Wt 129.6 lb

## 2020-08-02 DIAGNOSIS — Z3042 Encounter for surveillance of injectable contraceptive: Secondary | ICD-10-CM | POA: Diagnosis not present

## 2020-08-02 MED ORDER — MEDROXYPROGESTERONE ACETATE 150 MG/ML IM SUSP
150.0000 mg | Freq: Once | INTRAMUSCULAR | Status: AC
  Administered 2020-08-02: 150 mg via INTRAMUSCULAR

## 2020-08-02 NOTE — Progress Notes (Signed)
Interpreter Jamelle Haring was present for encounter. Depo Provera 150 mg IM administered as scheduled. Pt tolerated well. Next injection due 4/7-4/21/22.   Last Pap (negative) was 01/24/20. Next Annual Gyn exam due after 01/23/21. Pt voiced understanding and had no questions.

## 2020-08-04 NOTE — Progress Notes (Signed)
Chart reviewed for nurse visit. Agree with plan of care.   Axtyn Woehler Lorraine, CNM 08/04/2020 9:39 AM   

## 2020-10-18 ENCOUNTER — Other Ambulatory Visit: Payer: Self-pay

## 2020-10-18 ENCOUNTER — Ambulatory Visit (INDEPENDENT_AMBULATORY_CARE_PROVIDER_SITE_OTHER): Payer: Medicaid Other | Admitting: *Deleted

## 2020-10-18 VITALS — BP 119/89 | HR 74 | Ht 59.5 in | Wt 124.5 lb

## 2020-10-18 DIAGNOSIS — Z3042 Encounter for surveillance of injectable contraceptive: Secondary | ICD-10-CM

## 2020-10-18 MED ORDER — MEDROXYPROGESTERONE ACETATE 150 MG/ML IM SUSP
150.0000 mg | Freq: Once | INTRAMUSCULAR | Status: AC
  Administered 2020-10-18: 150 mg via INTRAMUSCULAR

## 2020-10-18 NOTE — Progress Notes (Signed)
Agree with A & P. 

## 2020-10-18 NOTE — Progress Notes (Signed)
Sabrina Daniels here for Depo-Provera Injection. Injection administered without complication. Patient will return in 3 months for next injection between 01/03/21 and 01/23/21. Next annual visit due 01/23/21.   Chen Saadeh,RN 10/18/2020  8:55 AM

## 2021-01-03 ENCOUNTER — Ambulatory Visit: Payer: Medicaid Other

## 2021-01-09 ENCOUNTER — Other Ambulatory Visit: Payer: Self-pay

## 2021-01-09 ENCOUNTER — Encounter: Payer: Self-pay | Admitting: Family Medicine

## 2021-01-09 ENCOUNTER — Ambulatory Visit (INDEPENDENT_AMBULATORY_CARE_PROVIDER_SITE_OTHER): Payer: Medicaid Other | Admitting: Family Medicine

## 2021-01-09 VITALS — BP 125/83 | HR 69 | Ht 60.5 in | Wt 126.2 lb

## 2021-01-09 DIAGNOSIS — R350 Frequency of micturition: Secondary | ICD-10-CM | POA: Diagnosis not present

## 2021-01-09 DIAGNOSIS — Z01419 Encounter for gynecological examination (general) (routine) without abnormal findings: Secondary | ICD-10-CM

## 2021-01-09 DIAGNOSIS — R6882 Decreased libido: Secondary | ICD-10-CM

## 2021-01-09 LAB — POCT URINALYSIS DIP (DEVICE)
Bilirubin Urine: NEGATIVE
Glucose, UA: NEGATIVE mg/dL
Hgb urine dipstick: NEGATIVE
Ketones, ur: NEGATIVE mg/dL
Leukocytes,Ua: NEGATIVE
Nitrite: NEGATIVE
Protein, ur: NEGATIVE mg/dL
Specific Gravity, Urine: 1.025 (ref 1.005–1.030)
Urobilinogen, UA: 0.2 mg/dL (ref 0.0–1.0)
pH: 6 (ref 5.0–8.0)

## 2021-01-09 MED ORDER — BUPROPION HCL ER (XL) 150 MG PO TB24: 150.0000 mg | ORAL_TABLET | Freq: Every day | ORAL | 1 refills | Status: DC

## 2021-01-09 MED ORDER — MEDROXYPROGESTERONE ACETATE 150 MG/ML IM SUSP
150.0000 mg | Freq: Once | INTRAMUSCULAR | Status: AC
  Administered 2021-01-09: 150 mg via INTRAMUSCULAR

## 2021-01-09 NOTE — Addendum Note (Signed)
Addended by: Faythe Casa on: 01/09/2021 03:18 PM   Modules accepted: Orders
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
## 2021-01-11 LAB — URINE CULTURE

## 2021-04-12 ENCOUNTER — Other Ambulatory Visit: Payer: Self-pay

## 2021-04-12 ENCOUNTER — Encounter: Payer: Self-pay | Admitting: Family

## 2021-04-12 ENCOUNTER — Ambulatory Visit (INDEPENDENT_AMBULATORY_CARE_PROVIDER_SITE_OTHER): Payer: Medicaid Other | Admitting: Family

## 2021-04-12 VITALS — BP 124/86 | HR 68 | Ht 60.0 in | Wt 127.9 lb

## 2021-04-12 DIAGNOSIS — R6882 Decreased libido: Secondary | ICD-10-CM | POA: Diagnosis not present

## 2021-04-12 DIAGNOSIS — Z3042 Encounter for surveillance of injectable contraceptive: Secondary | ICD-10-CM

## 2021-04-12 DIAGNOSIS — N898 Other specified noninflammatory disorders of vagina: Secondary | ICD-10-CM | POA: Diagnosis not present

## 2021-04-12 DIAGNOSIS — Z23 Encounter for immunization: Secondary | ICD-10-CM | POA: Diagnosis not present

## 2021-04-12 LAB — POCT URINALYSIS DIP (DEVICE)
Bilirubin Urine: NEGATIVE
Glucose, UA: NEGATIVE mg/dL
Hgb urine dipstick: NEGATIVE
Ketones, ur: NEGATIVE mg/dL
Leukocytes,Ua: NEGATIVE
Nitrite: NEGATIVE
Protein, ur: NEGATIVE mg/dL
Specific Gravity, Urine: 1.02 (ref 1.005–1.030)
Urobilinogen, UA: 0.2 mg/dL (ref 0.0–1.0)
pH: 6.5 (ref 5.0–8.0)

## 2021-04-12 LAB — POCT PREGNANCY, URINE: Preg Test, Ur: NEGATIVE

## 2021-04-12 MED ORDER — MEDROXYPROGESTERONE ACETATE 150 MG/ML IM SUSP
150.0000 mg | Freq: Once | INTRAMUSCULAR | Status: AC
  Administered 2021-04-12: 150 mg via INTRAMUSCULAR

## 2021-04-12 NOTE — Progress Notes (Signed)
History:  [name] is a 40 y.o. [phone] who presents to clinic today for reports of decreased sex drive and vaginal lubrication due since starting depo provera.  Uncertain when her mother entered menopause.  No change in partner.  No report of abnormal vaginal discharge.  Also reports, dry mouth and occasional flashes.     The following portions of the patient's history were reviewed and updated as appropriate: allergies, current medications, family history, past medical history, social history, past surgical history and problem list.  Review of Systems:  Review of Systems  Constitutional:        Occasional flashes  HENT:         Dry mouth  Genitourinary:  Negative for menstrual problem, pelvic pain, vaginal discharge and vaginal pain.  Musculoskeletal: Negative.   Neurological:  Negative for dizziness and light-headedness.  Psychiatric/Behavioral:  Negative for agitation and sleep disturbance. The patient is not nervous/anxious.        Decreased libido      Objective:  Physical Exam BP 124/86   Pulse 68   Ht 5' (1.524 m)   Wt 127 lb 14.4 oz (58 kg)   LMP  (LMP Unknown)   BMI 24.98 kg/m  Physical Exam Constitutional:      General: She is not in acute distress.    Appearance: She is well-developed.  HENT:     Head: Normocephalic and atraumatic.  Neck:     Thyroid: No thyromegaly.  Abdominal:     General: Bowel sounds are normal.  Musculoskeletal:     Cervical back: Normal range of motion and neck supple.  Skin:    General: Skin is warm and dry.  Neurological:     Mental Status: She is alert and oriented to person, place, and time.   Labs and Imaging No results found for this or any previous visit (from the past 24 hour(s)).  No results found.   Assessment & Plan:  1. Needs flu shot - Flu Vaccine QUAD 22mo+IM (Fluarix, Fluzone & Alfiuria Quad PF)  2.  Decreased Libido - Discussed with Sabrina Daniels uncertain if entering perimenopause due to the use of DMPA - Reviewed  how decrease libido has been reported with DMPA use, may consider hormonal use;  Need to consult regarding possible treatment options  3.  Vaginal Dryness - Recommended the use of vaginal lubricant prior to intercourse  Follow-up in 4-6 weeks  In person interpreter present for entire visit.    Amedeo Gory, CNM 04/12/2021 8:46 AM

## 2021-04-22 DIAGNOSIS — N898 Other specified noninflammatory disorders of vagina: Secondary | ICD-10-CM | POA: Insufficient documentation

## 2021-04-22 DIAGNOSIS — Z3042 Encounter for surveillance of injectable contraceptive: Secondary | ICD-10-CM | POA: Insufficient documentation

## 2021-04-22 DIAGNOSIS — R6882 Decreased libido: Secondary | ICD-10-CM | POA: Insufficient documentation

## 2021-07-01 ENCOUNTER — Other Ambulatory Visit: Payer: Self-pay

## 2021-07-01 ENCOUNTER — Ambulatory Visit (INDEPENDENT_AMBULATORY_CARE_PROVIDER_SITE_OTHER): Payer: Medicaid Other | Admitting: *Deleted

## 2021-07-01 VITALS — BP 123/73 | HR 70 | Ht 61.0 in | Wt 128.0 lb

## 2021-07-01 DIAGNOSIS — Z3042 Encounter for surveillance of injectable contraceptive: Secondary | ICD-10-CM | POA: Diagnosis not present

## 2021-07-01 MED ORDER — MEDROXYPROGESTERONE ACETATE 150 MG/ML IM SUSP
150.0000 mg | Freq: Once | INTRAMUSCULAR | Status: AC
  Administered 2021-07-01: 14:00:00 150 mg via INTRAMUSCULAR

## 2021-07-01 NOTE — Progress Notes (Signed)
Sabrina Daniels here for Depo-Provera  Injection.  Injection administered without complication. Patient will return in 3 months 09/16/21-09/30/21 for next injection. Last depo-provera was 04/12/21. Last annual was 12/13/20. Last pap was 01/24/20.   Moksh Loomer,RN 07/01/2021  1:46 PM

## 2021-07-01 NOTE — Progress Notes (Signed)
Patient was evaluated by nursing staff. Agree with assessment and plan.  °

## 2021-09-16 ENCOUNTER — Other Ambulatory Visit: Payer: Self-pay

## 2021-09-16 ENCOUNTER — Ambulatory Visit (INDEPENDENT_AMBULATORY_CARE_PROVIDER_SITE_OTHER): Payer: Medicaid Other

## 2021-09-16 VITALS — BP 127/83 | HR 71 | Wt 125.3 lb

## 2021-09-16 DIAGNOSIS — Z3042 Encounter for surveillance of injectable contraceptive: Secondary | ICD-10-CM | POA: Diagnosis not present

## 2021-09-16 MED ORDER — MEDROXYPROGESTERONE ACETATE 150 MG/ML IM SUSP
150.0000 mg | Freq: Once | INTRAMUSCULAR | Status: AC
  Administered 2021-09-16: 150 mg via INTRAMUSCULAR

## 2021-09-16 NOTE — Progress Notes (Signed)
Darria Baade here for Depo-Provera Injection. Injection administered without complication. Patient will return in 3 months for next injection between May 22 and June 5. Next annual visit due June 2023.  ? ?Cline Crock, RN ?09/16/2021   ?

## 2021-12-02 ENCOUNTER — Ambulatory Visit (INDEPENDENT_AMBULATORY_CARE_PROVIDER_SITE_OTHER): Payer: Medicaid Other

## 2021-12-02 VITALS — BP 115/78 | HR 65 | Wt 125.7 lb

## 2021-12-02 DIAGNOSIS — Z3042 Encounter for surveillance of injectable contraceptive: Secondary | ICD-10-CM | POA: Diagnosis not present

## 2021-12-02 MED ORDER — MEDROXYPROGESTERONE ACETATE 150 MG/ML IM SUSP
150.0000 mg | Freq: Once | INTRAMUSCULAR | Status: AC
  Administered 2021-12-02: 150 mg via INTRAMUSCULAR

## 2021-12-02 NOTE — Progress Notes (Signed)
Sabrina Daniels here for Depo-Provera Injection. Injection administered without complication. Patient will return in 3 months for next injection between 02/17/22 and 03/03/22. Next annual visit due June 2023. Patient to schedule annual with Depo during Depo window.  Marjo Bicker, RN 12/02/2021  11:39 AM

## 2022-02-26 ENCOUNTER — Ambulatory Visit (INDEPENDENT_AMBULATORY_CARE_PROVIDER_SITE_OTHER): Payer: Medicaid Other | Admitting: Obstetrics and Gynecology

## 2022-02-26 ENCOUNTER — Encounter: Payer: Self-pay | Admitting: Obstetrics and Gynecology

## 2022-02-26 VITALS — BP 137/98 | HR 70 | Wt 130.0 lb

## 2022-02-26 DIAGNOSIS — Z113 Encounter for screening for infections with a predominantly sexual mode of transmission: Secondary | ICD-10-CM

## 2022-02-26 DIAGNOSIS — Z3042 Encounter for surveillance of injectable contraceptive: Secondary | ICD-10-CM | POA: Diagnosis not present

## 2022-02-26 DIAGNOSIS — R030 Elevated blood-pressure reading, without diagnosis of hypertension: Secondary | ICD-10-CM

## 2022-02-26 DIAGNOSIS — Z01419 Encounter for gynecological examination (general) (routine) without abnormal findings: Secondary | ICD-10-CM

## 2022-02-26 DIAGNOSIS — Z789 Other specified health status: Secondary | ICD-10-CM

## 2022-02-26 DIAGNOSIS — Z1231 Encounter for screening mammogram for malignant neoplasm of breast: Secondary | ICD-10-CM

## 2022-02-26 MED ORDER — CHOLECALCIFEROL 50 MCG (2000 UT) PO CAPS: 1.0000 | ORAL_CAPSULE | Freq: Every day | ORAL | 3 refills | Status: AC

## 2022-02-26 MED ORDER — CALCIUM CARBONATE ANTACID 1000 MG PO CHEW: 800.0000 mg | CHEWABLE_TABLET | Freq: Every day | ORAL | 3 refills | Status: AC

## 2022-02-26 MED ORDER — MEDROXYPROGESTERONE ACETATE 150 MG/ML IM SUSP
150.0000 mg | Freq: Once | INTRAMUSCULAR | Status: AC
  Administered 2022-02-26: 150 mg via INTRAMUSCULAR

## 2022-02-26 NOTE — Progress Notes (Signed)
Sabrina Daniels here for Depo-Provera Injection and annual. Injection administered without complication. Patient will return in 3 months for next injection between 05/14/22 and 05/28/22.   Normal pap smear: 01/24/20   Guy Begin, CMA 02/26/2022  4:19 PM

## 2022-02-27 LAB — LIPID PANEL
Chol/HDL Ratio: 3.8 ratio (ref 0.0–4.4)
Cholesterol, Total: 139 mg/dL (ref 100–199)
HDL: 37 mg/dL — ABNORMAL LOW (ref >39)
LDL Chol Calc (NIH): 56 mg/dL (ref 0–99)
Triglycerides: 291 mg/dL — ABNORMAL HIGH (ref 0–149)
VLDL Cholesterol Cal: 46 mg/dL — ABNORMAL HIGH (ref 5–40)

## 2022-02-27 LAB — HEMOGLOBIN A1C
Est. average glucose Bld gHb Est-mCnc: 108 mg/dL
Hgb A1c MFr Bld: 5.4 % (ref 4.8–5.6)

## 2022-02-27 LAB — HIV ANTIBODY (ROUTINE TESTING W REFLEX): HIV Screen 4th Generation wRfx: NONREACTIVE

## 2022-02-27 LAB — COMPREHENSIVE METABOLIC PANEL
ALT: 18 IU/L (ref 0–32)
AST: 21 IU/L (ref 0–40)
Albumin/Globulin Ratio: 2 (ref 1.2–2.2)
Albumin: 4.5 g/dL (ref 3.9–4.9)
Alkaline Phosphatase: 107 IU/L (ref 44–121)
BUN/Creatinine Ratio: 19 (ref 9–23)
BUN: 14 mg/dL (ref 6–24)
Bilirubin Total: <0.2 mg/dL (ref 0.0–1.2)
CO2: 21 mmol/L (ref 20–29)
Calcium: 10 mg/dL (ref 8.7–10.2)
Chloride: 104 mmol/L (ref 96–106)
Creatinine, Ser: 0.75 mg/dL (ref 0.57–1.00)
Globulin, Total: 2.3 g/dL (ref 1.5–4.5)
Glucose: 146 mg/dL — ABNORMAL HIGH (ref 70–99)
Potassium: 4 mmol/L (ref 3.5–5.2)
Sodium: 139 mmol/L (ref 134–144)
Total Protein: 6.8 g/dL (ref 6.0–8.5)
eGFR: 103 mL/min/{1.73_m2} (ref >59)

## 2022-02-27 LAB — CBC
Hematocrit: 41.8 % (ref 34.0–46.6)
Hemoglobin: 13.4 g/dL (ref 11.1–15.9)
MCH: 22.8 pg — ABNORMAL LOW (ref 26.6–33.0)
MCHC: 32.1 g/dL (ref 31.5–35.7)
MCV: 71 fL — ABNORMAL LOW (ref 79–97)
Platelets: 241 10*3/uL (ref 150–450)
RBC: 5.87 x10E6/uL — ABNORMAL HIGH (ref 3.77–5.28)
RDW: 14.6 % (ref 11.7–15.4)
WBC: 5.9 10*3/uL (ref 3.4–10.8)

## 2022-02-27 LAB — HEPATITIS B SURFACE ANTIGEN: Hepatitis B Surface Ag: NEGATIVE

## 2022-02-27 LAB — TSH RFX ON ABNORMAL TO FREE T4: TSH: 1.05 u[IU]/mL (ref 0.450–4.500)

## 2022-02-27 LAB — HEPATITIS C ANTIBODY: Hep C Virus Ab: NONREACTIVE

## 2022-02-27 LAB — RPR: RPR Ser Ql: NONREACTIVE
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
## 2022-03-01 DIAGNOSIS — R030 Elevated blood-pressure reading, without diagnosis of hypertension: Secondary | ICD-10-CM | POA: Insufficient documentation

## 2022-04-02 ENCOUNTER — Encounter: Payer: Self-pay | Admitting: Obstetrics and Gynecology

## 2022-04-02 DIAGNOSIS — E785 Hyperlipidemia, unspecified: Secondary | ICD-10-CM | POA: Insufficient documentation

## 2022-04-03 ENCOUNTER — Telehealth: Payer: Self-pay

## 2022-04-03 NOTE — Telephone Encounter (Signed)
-----   Message from Aletha Halim, MD sent at 04/02/2022  9:03 AM EDT ----- Can you let her know that her sugar and cholesterol was a little high, with nothing to do except follow up with a PCP. Also, can you see if she was contacted after I placed my family medicine referral to establish a PCP? If not, can y'all make sure I did the referral correctly? thanks

## 2022-04-03 NOTE — Telephone Encounter (Signed)
Call placed to pt with CAP interpreter Keo and pt did not answer and VM not set up. Will try patient again.  Colletta Maryland, RNC

## 2022-04-04 NOTE — Telephone Encounter (Signed)
Called pt with CAP interpreter Keo. Call cannot be completed to patient. Called pt contact (husband) who states patient is at work. Husband verifies I am calling correct phone number. I requests patient's husband tell her we called and ask her to call back.   Per chart review, referral for PCP still in process. No documentation showing patient has been contacted.

## 2022-05-29 ENCOUNTER — Encounter: Payer: Self-pay | Admitting: Obstetrics and Gynecology

## 2022-05-29 ENCOUNTER — Ambulatory Visit
Admission: RE | Admit: 2022-05-29 | Discharge: 2022-05-29 | Disposition: A | Discharge status: Home / Self Care | Payer: Medicaid Other | Source: Ambulatory Visit | Attending: Obstetrics and Gynecology | Admitting: Obstetrics and Gynecology

## 2022-05-29 ENCOUNTER — Ambulatory Visit (INDEPENDENT_AMBULATORY_CARE_PROVIDER_SITE_OTHER): Payer: Medicaid Other | Admitting: Obstetrics and Gynecology

## 2022-05-29 VITALS — BP 129/84 | HR 66 | Ht 60.0 in | Wt 125.0 lb

## 2022-05-29 DIAGNOSIS — Z1231 Encounter for screening mammogram for malignant neoplasm of breast: Secondary | ICD-10-CM | POA: Diagnosis not present

## 2022-05-29 DIAGNOSIS — Z01419 Encounter for gynecological examination (general) (routine) without abnormal findings: Secondary | ICD-10-CM

## 2022-05-29 DIAGNOSIS — Z3042 Encounter for surveillance of injectable contraceptive: Secondary | ICD-10-CM

## 2022-05-29 MED ORDER — MEDROXYPROGESTERONE ACETATE 150 MG/ML IM SUSP
150.0000 mg | Freq: Once | INTRAMUSCULAR | Status: AC
  Administered 2022-05-29: 150 mg via INTRAMUSCULAR
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
## 2022-08-14 ENCOUNTER — Ambulatory Visit (INDEPENDENT_AMBULATORY_CARE_PROVIDER_SITE_OTHER): Payer: Medicaid Other | Admitting: General Practice

## 2022-08-14 VITALS — BP 124/87 | HR 75 | Ht 60.0 in | Wt 126.0 lb

## 2022-08-14 DIAGNOSIS — Z3042 Encounter for surveillance of injectable contraceptive: Secondary | ICD-10-CM | POA: Diagnosis not present

## 2022-08-14 MED ORDER — MEDROXYPROGESTERONE ACETATE 150 MG/ML IM SUSP
150.0000 mg | Freq: Once | INTRAMUSCULAR | Status: AC
  Administered 2022-08-14: 150 mg via INTRAMUSCULAR

## 2022-08-14 NOTE — Progress Notes (Signed)
Sabrina Daniels here for Depo-Provera Injection. Injection administered without complication. Patient will return in 3 months for next injection between April 19 and May 3. Next annual visit due November 2024.   Derinda Late, RN 08/14/2022  1:55 PM

## 2022-11-03 ENCOUNTER — Ambulatory Visit: Payer: Medicaid Other

## 2022-12-18 ENCOUNTER — Ambulatory Visit (INDEPENDENT_AMBULATORY_CARE_PROVIDER_SITE_OTHER): Payer: Medicaid Other | Admitting: General Practice

## 2022-12-18 VITALS — BP 133/94 | HR 73 | Ht 61.0 in | Wt 127.0 lb

## 2022-12-18 DIAGNOSIS — Z3042 Encounter for surveillance of injectable contraceptive: Secondary | ICD-10-CM

## 2022-12-18 LAB — POCT PREGNANCY, URINE: Preg Test, Ur: NEGATIVE

## 2022-12-18 MED ORDER — MEDROXYPROGESTERONE ACETATE 150 MG/ML IM SUSY
150.0000 mg | PREFILLED_SYRINGE | Freq: Once | INTRAMUSCULAR | Status: AC
  Administered 2022-12-18: 150 mg via INTRAMUSCULAR

## 2022-12-18 NOTE — Progress Notes (Signed)
Sabrina Daniels here for Depo-Provera Injection. Injection administered without complication. Patient will return in 3 months for next injection between 8/22 and 9/5. Next annual visit due November 2024. Patient has elevated BP x 2- she doesn't have a PCP only [place] that she sees. Scheduled appt with PCP for patient for 6/20. Patient informed.   Marylynn Pearson, RN 12/18/2022  10:08 AM

## 2023-01-01 ENCOUNTER — Telehealth: Payer: Self-pay | Admitting: General Practice

## 2023-01-01 ENCOUNTER — Ambulatory Visit: Payer: Medicaid Other | Admitting: Internal Medicine

## 2023-01-01 NOTE — Telephone Encounter (Signed)
Pt was a no show for a NP appt with Morrie Sheldon on 01/01/23, I did not send a letter.

## 2023-03-09 ENCOUNTER — Ambulatory Visit: Payer: Medicaid Other | Admitting: *Deleted

## 2023-04-10 NOTE — Progress Notes (Signed)
Opened in error [facility]

## 2023-12-18 ENCOUNTER — Other Ambulatory Visit (HOSPITAL_COMMUNITY)
Admission: RE | Admit: 2023-12-18 | Discharge: 2023-12-18 | Disposition: A | Discharge status: Home / Self Care | Source: Ambulatory Visit | Attending: Student | Admitting: Student

## 2023-12-18 ENCOUNTER — Other Ambulatory Visit: Payer: Self-pay

## 2023-12-18 ENCOUNTER — Ambulatory Visit: Admitting: Student

## 2023-12-18 ENCOUNTER — Encounter: Payer: Self-pay | Admitting: Family Medicine

## 2023-12-18 VITALS — BP 133/84 | HR 74 | Ht 61.0 in | Wt 125.2 lb

## 2023-12-18 DIAGNOSIS — Z3202 Encounter for pregnancy test, result negative: Secondary | ICD-10-CM

## 2023-12-18 DIAGNOSIS — Z124 Encounter for screening for malignant neoplasm of cervix: Secondary | ICD-10-CM

## 2023-12-18 DIAGNOSIS — N926 Irregular menstruation, unspecified: Secondary | ICD-10-CM

## 2023-12-18 DIAGNOSIS — Z758 Other problems related to medical facilities and other health care: Secondary | ICD-10-CM

## 2023-12-18 DIAGNOSIS — Z1239 Encounter for other screening for malignant neoplasm of breast: Secondary | ICD-10-CM

## 2023-12-18 DIAGNOSIS — Z603 Acculturation difficulty: Secondary | ICD-10-CM | POA: Diagnosis not present

## 2023-12-18 LAB — POCT PREGNANCY, URINE: Preg Test, Ur: NEGATIVE

## 2023-12-18 NOTE — Progress Notes (Addendum)
 History:  Sabrina Daniels is a 43 y.o. [id number] who presents to clinic today for not having a period since stopping Depo in September 2024. Reports being on Depo for 5 years consecutively. Did not have periods while on Depo. Patient is currently sexually active, practicing withdrawal, and concerned for pregnancy.  Does not desire hormonal therapy at this time.   Requests for mammogram and pap smear. Interpreter is present throughout exam.   The following portions of the patient's history were reviewed and updated as appropriate: allergies, current medications, family history, past medical history, social history, past surgical history and problem list.  Review of Systems:  Review of Systems  Constitutional: Negative.   Respiratory:  Negative for cough, shortness of breath and wheezing.   Cardiovascular:  Negative for chest pain and palpitations.  Gastrointestinal:  Negative for abdominal pain, nausea and vomiting.  Genitourinary:  Negative for dysuria and hematuria.  Skin: Negative.   Neurological: Negative.   Psychiatric/Behavioral:  Negative for depression. The patient is nervous/anxious.       Objective:  Physical Exam BP (!) 148/91   Pulse 62   Ht 5' 1 (1.549 m)   Wt 125 lb 3.2 oz (56.8 kg)   BMI 23.66 kg/m  Physical Exam Exam conducted with a chaperone present.  Constitutional:      Appearance: Normal appearance. She is normal weight.  Cardiovascular:     Rate and Rhythm: Normal rate.  Pulmonary:     Effort: Pulmonary effort is normal.  Genitourinary:    General: Normal vulva.     Exam position: Supine.     Labia:        Right: No rash, tenderness or lesion.        Left: No rash, tenderness or lesion.      Urethra: No prolapse or urethral lesion.     Vagina: Normal. No vaginal discharge, erythema, tenderness or bleeding.     Cervix: Dilated. No cervical motion tenderness, discharge, friability, lesion, erythema, cervical bleeding or eversion.     Uterus: Normal.       Rectum: Normal.  Skin:    General: Skin is warm and dry.  Neurological:     Mental Status: She is alert and oriented to person, place, and time. Mental status is at baseline.  Psychiatric:        Mood and Affect: Mood is anxious.        Behavior: Behavior normal.     Labs and Imaging Results for orders placed or performed in visit on 12/18/23 (from the past 24 hours)  Pregnancy, urine POC     Status: None   Collection Time: 12/18/23 10:42 AM  Result Value Ref Range   Preg Test, Ur NEGATIVE NEGATIVE    No results found.  Health Maintenance Due  Topic Date Due   FOOT EXAM  Never done   OPHTHALMOLOGY EXAM  Never done   Diabetic kidney evaluation - Urine ACR  Never done   HEMOGLOBIN A1C  08/29/2022   Diabetic kidney evaluation - eGFR measurement  02/27/2023   COVID-19 Vaccine (1 - 2024-25 season) Never done    Labs, imaging and previous visits in Epic and Care Everywhere reviewed  Assessment & Plan:   1. Missed periods (Primary) - In depth conversation about possible indications of amenorrhea. Discussed change in hormones from stopping Depo, perimenopause, endocrine process, or pregnancy. Offered blood work to screen for possible causes. Recommend break from hormonal therapy to investigate peri-/menopause and barrier method for contraception.  Discussed option for Paraguard and patient declined. Recommend follow-up in 3 months.  - UPT Negative  - Discussed family planning- Patient desires to use barrier method - Questions addressed - CBC - Prolactin - TestT+TestF+SHBG - TSH - Hemoglobin A1c - Beta hCG quant (ref lab) - Follicle stimulating hormone - Estradiol  - LH  2. Breast screenings - MM 3D SCREENING MAMMOGRAM BILATERAL BREAST; Future  3. Cervical cancer screening - Cytology - PAP( [facility]) - Normal Pap 2021  4. Does not have primary care provider - Ambulatory referral to Internal Medicine   Approximately 30 minutes of total time was spent with this  patient on counseling nad coordination of care  Return in about 3 months (around 03/19/2024), or if symptoms worsen or fail to improve, for IN-PERSON.  Keiton Cosma, NP 12/18/2023 11:25 AM

## 2023-12-18 NOTE — Progress Notes (Signed)
 Pt reports that she missed her Depo shot in September & has not had a Period since.

## 2023-12-22 LAB — TESTT+TESTF+SHBG
Sex Hormone Binding: 51.7 nmol/L (ref 24.6–122.0)
Testosterone, Free: 1.1 pg/mL (ref 0.0–4.2)
Testosterone, Total, LC/MS: 20.9 ng/dL

## 2023-12-22 LAB — CBC
Hematocrit: 44.2 % (ref 34.0–46.6)
Hemoglobin: 13.8 g/dL (ref 11.1–15.9)
MCH: 23.5 pg — ABNORMAL LOW (ref 26.6–33.0)
MCHC: 31.2 g/dL — ABNORMAL LOW (ref 31.5–35.7)
MCV: 75 fL — ABNORMAL LOW (ref 79–97)
Platelets: 227 10*3/uL (ref 150–450)
RBC: 5.88 x10E6/uL — ABNORMAL HIGH (ref 3.77–5.28)
RDW: 14.8 % (ref 11.7–15.4)
WBC: 5.9 10*3/uL (ref 3.4–10.8)

## 2023-12-22 LAB — TSH: TSH: 1 u[IU]/mL (ref 0.450–4.500)

## 2023-12-22 LAB — LUTEINIZING HORMONE: LH: 7.7 m[IU]/mL

## 2023-12-22 LAB — BETA HCG QUANT (REF LAB): hCG Quant: <1 m[IU]/mL

## 2023-12-22 LAB — PROLACTIN: Prolactin: 6.9 ng/mL (ref 4.8–33.4)

## 2023-12-22 LAB — FOLLICLE STIMULATING HORMONE: FSH: 6.1 m[IU]/mL

## 2023-12-22 LAB — ESTRADIOL: Estradiol: 66.1 pg/mL

## 2023-12-22 LAB — HEMOGLOBIN A1C
Est. average glucose Bld gHb Est-mCnc: 103 mg/dL
Hgb A1c MFr Bld: 5.2 % (ref 4.8–5.6)

## 2023-12-24 ENCOUNTER — Ambulatory Visit: Payer: Self-pay | Admitting: Student

## 2024-01-05 NOTE — Telephone Encounter (Signed)
 Left Voicemail on spouse phone number asking for pt to call office.  Phone number listed for pt [phone], was not a working number.   [place] Interpreter: Risuin ID# [id number] interpreted call and voice message.    [place], RN

## 2024-01-08 ENCOUNTER — Ambulatory Visit
Admission: RE | Admit: 2024-01-08 | Discharge: 2024-01-08 | Disposition: A | Discharge status: Home / Self Care | Source: Ambulatory Visit | Attending: Student | Admitting: Student

## 2024-01-08 DIAGNOSIS — Z1239 Encounter for other screening for malignant neoplasm of breast: Secondary | ICD-10-CM
